# Patient Record
Sex: Male | Born: 1955 | Race: White | Hispanic: No | Marital: Married | Smoking: Former smoker
Health system: Southern US, Community
[De-identification: ages and names within clinical notes are randomized; demographics above are authoritative.]

## PROBLEM LIST (undated history)

## (undated) DIAGNOSIS — H269 Unspecified cataract: Secondary | ICD-10-CM

## (undated) DIAGNOSIS — K219 Gastro-esophageal reflux disease without esophagitis: Secondary | ICD-10-CM

## (undated) DIAGNOSIS — R7303 Prediabetes: Secondary | ICD-10-CM

## (undated) DIAGNOSIS — I10 Essential (primary) hypertension: Secondary | ICD-10-CM

## (undated) DIAGNOSIS — E785 Hyperlipidemia, unspecified: Secondary | ICD-10-CM

## (undated) DIAGNOSIS — Z86010 Personal history of colonic polyps: Secondary | ICD-10-CM

## (undated) HISTORY — PX: COLONOSCOPY: SHX174

## (undated) HISTORY — PX: POLYPECTOMY: SHX149

## (undated) HISTORY — DX: Prediabetes: R73.03

## (undated) HISTORY — DX: Hyperlipidemia, unspecified: E78.5

## (undated) HISTORY — DX: Unspecified cataract: H26.9

## (undated) HISTORY — DX: Personal history of colonic polyps: Z86.010

## (undated) HISTORY — DX: Essential (primary) hypertension: I10

## (undated) HISTORY — DX: Gastro-esophageal reflux disease without esophagitis: K21.9

## (undated) HISTORY — PX: EXTERNAL EAR SURGERY: SHX627

## (undated) HISTORY — PX: WRIST SURGERY: SHX841

## (undated) HISTORY — PX: OTHER SURGICAL HISTORY: SHX169

---

## 2001-09-21 ENCOUNTER — Encounter: Payer: Self-pay | Admitting: Emergency Medicine

## 2001-09-21 ENCOUNTER — Observation Stay (HOSPITAL_COMMUNITY): Admission: EM | Admit: 2001-09-21 | Discharge: 2001-09-22 | Payer: Self-pay | Admitting: Emergency Medicine

## 2006-10-26 ENCOUNTER — Ambulatory Visit: Payer: Self-pay | Admitting: Internal Medicine

## 2006-11-05 ENCOUNTER — Encounter: Payer: Self-pay | Admitting: Internal Medicine

## 2006-11-05 ENCOUNTER — Ambulatory Visit: Payer: Self-pay | Admitting: Internal Medicine

## 2006-11-05 DIAGNOSIS — Z860100 Personal history of colon polyps, unspecified: Secondary | ICD-10-CM

## 2006-11-05 HISTORY — DX: Personal history of colon polyps, unspecified: Z86.0100

## 2006-11-05 HISTORY — DX: Personal history of colonic polyps: Z86.010

## 2009-07-22 ENCOUNTER — Emergency Department (HOSPITAL_COMMUNITY): Admission: EM | Admit: 2009-07-22 | Discharge: 2009-07-23 | Payer: Self-pay | Admitting: Emergency Medicine

## 2010-06-14 NOTE — H&P (Signed)
NAME:  Samuel Macias, Samuel Macias NO.:  [id number]   MEDICAL RECORD NO.:  [id number]                   PATIENT TYPE:  INP   LOCATION:  4727                                 FACILITY:  [facility]   PHYSICIAN:  Rollene Rotunda, M.D. [facility]            DATE OF BIRTH:  11/22/1955   DATE OF ADMISSION:  09/21/2001  DATE OF DISCHARGE:  09/22/2001                                HISTORY & PHYSICAL   PRIMARY CARE PHYSICIAN:  [facility].   CARDIOLOGIST:  None.   REASON FOR ADMISSION:  Evaluate patient with chest pain.   HISTORY OF PRESENT ILLNESS:  The patient is a pleasant 55 year old gentleman  with no prior cardiac history.  He reports a history of feeling flushed this  a.m.  He was somewhat diaphoretic on his forehead and his neck.  He felt  slightly nauseated and said he was on the verge of being dizzy.  He was  very slightly short of breath.  His ears and head started to tingling and  then his hands.  All of this started approximately 9:15.  The symptoms  continued unabated until 10:30 when he called EMS.  At this point he was  feeling quite anxious.  He was seen by EMS and his blood pressure was  190/100.  He was treated with sublingual nitroglycerin and aspirin and he  said that slowly his symptoms resolved.  He had never had symptoms like this  before.  He has occasional fleeting sharp chest pains and had one at 6:30  this morning but did not have chest pressure or discomfort during this.  He  did not describe neck discomfort or arm pressure.  He did not vomit.   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  With this he denies  any chest discomfort, neck discomfort, arm discomfort, excessive shortness  of breath or diaphoresis.  He is currently symptom free.   PAST MEDICAL HISTORY:  Borderline hypertension, mild hyperlipidemia x 1-1/2  years, cervical disc disease.   PAST SURGICAL HISTORY:  Left wrist tendon repair.   ALLERGIES:  None.   MEDICATIONS:  Lipitor 10 mg q.d., multivitamin.   SOCIAL HISTORY:  The patient smoked briefly when he was a teenager.  He does  chew tobacco.  He drinks one case of beer per week.  He is married and works  for [employer].   FAMILY HISTORY:  Noncontributory for early coronary artery disease.   REVIEW OF SYSTEMS:  As stated in the HPI, otherwise negative for all  systems.   PHYSICAL EXAMINATION:  GENERAL:  The patient is in no distress.  VITAL SIGNS:  Temperature 100.3, heart rate 70s and regular, blood pressure  148/91.  HEENT:  Eyelids unremarkable.  Pupils equal, round and reactive to light.  Fundi not visualized.  Oral mucosa unremarkable.  NECK:  No jugular venous distention.  Carotid upstroke brisk and symmetric.  No bruits.  No thyromegaly.  LYMPHATICS:  No cervical, axillary or inguinal adenopathy.  LUNGS:  Clear to auscultation bilaterally.  BACK:  No costovertebral angle tenderness.  CHEST:  Unremarkable.  HEART:  PMI not displaced or sustained.  S1 and S2 within normal limits.  No  S3, no S4.  No murmurs.  ABDOMEN:  Flat, positive bowel sounds.  Normal in frequency and pitch.  No  bruits, rebound, guarding or mass.  No hepatosplenomegaly.  SKIN:  No rash, no nodules.  EXTREMITIES:  2+ pulses throughout.  No edema.  NEUROLOGIC:  Oriented to person, place and time.  Cranial nerves II-XII  grossly intact.  Motor grossly intact.   LABORATORY DATA:  EKG:  Sinus rhythm, rate 70, axis within normal limits,  intervals within normal limits, no acute ST-T wave changes.  Chest x-ray:  Mild left lower lobe lingular atelectasis.  There is an unclear metallic,  small density in the anterior chest.  WBC 5.2, hemoglobin 13.6.  Sodium 140,  potassium 3.8, BUN 20, creatinine 1.1, CK 148.   ASSESSMENT AND PLAN:  1. Chest discomfort.  The patient's chest discomfort and other symptoms are     atypical.  He has minimal cardiovascular risk factors.  His physical     examination is unrevealing for  vascular disease.  His EKG is within     normal limits.  At this point given the risk factors and the resolution     of the symptoms with nitroglycerin I do think it is prudent to keep him     in the hospital for observation and rule out myocardial infarction.     Provided his enzymes are negative, he will be sent for a stress perfusion     study at the office tomorrow.  Further evaluation will be based on these     results.  The risks and benefits have been described to the patient and     he agrees to proceed.  2. Hypertension.  We will observe this overnight.  I suspect he will need     antihypertensive therapy, perhaps a diuretic.  3. Hyperlipidemia.  We will check a fasting lipid profile.  4. Tobacco.  We will educate him.                                               Rollene Rotunda, M.D. [facility]    JH/MEDQ  D:  09/21/2001  T:  09/22/2001  Job:  [id number]

## 2010-06-14 NOTE — Discharge Summary (Signed)
   NAME:  Samuel Macias, Samuel Macias NO.:  [id number]   MEDICAL RECORD NO.:  [id number]                   PATIENT TYPE:  INP   LOCATION:  4727                                 FACILITY:  [facility]   PHYSICIAN:  Rollene Rotunda, M.D. [facility]            DATE OF BIRTH:  1955/06/22   DATE OF ADMISSION:  09/21/2001  DATE OF DISCHARGE:  09/22/2001                           DISCHARGE SUMMARY - REFERRING   DISCHARGE DIAGNOSES:  1. Chest pain.  2. Hyperlipidemia, treated.  3. Hypertension.   HISTORY OF PRESENT ILLNESS:  The patient is a 55 year old male patient who  presented to [facility] with a two month history of fleeting, sharp  mid-sternal chest pain.  He denied any chest discomfort with exertion or  strenuous activity.   PAST MEDICAL HISTORY:  Significant for hyperlipidemia, treated.  He is a  smoker.  He has a history of borderline hypertension and is not on any  medication for this with the exception of his diet control.   HOSPITAL COURSE:  The patient ruled out for myocardial infarction during his  hospital stay.  His EKG revealed normal sinus rhythm with a rate of 70 with  normal axis and no acute ST-T wave changes.  His EKG revealed stable  overnight and his blood pressure also remained stable (120/79).  He was  discharged to home after a brief, 24-hour, observation.  He is scheduled to  be seen in the office today, 09/22/2001, at 12:30 p.m. for a stress test.  He has received instructions not to eat prior to this.  He is to remain on  his same medications which include Lipitor and a multivitamin 1 p.o. q.d.  He was also given a new prescription for sublingual nitroglycerin p.r.n.  chest pain.  He is to return to work on 09/28/2001 and remain on a low fat  diet.  He has a followup appointment with Dr. Antoine Poche on 10/12/2001 at 11  a.m.  Dr. Antoine Poche will be in the office on this day and will need to be  alerted that this patient is in the office.   LABORATORY DATA:  Sodium 140, potassium 3.8, BUN 20, creatinine 1.1.  Cardiac isoenzymes are negative.  CBC reveals a hemoglobin of 13.6,  hematocrit 39.8, platelets 230, white count 5.2.  TSH was normal.     Guy Franco, P.A. [facility]                      Rollene Rotunda, M.D. [facility]    LB/MEDQ  D:  09/22/2001  T:  09/24/2001  Job:  [id number]   cc:   [facility]

## 2011-03-01 ENCOUNTER — Encounter: Payer: Self-pay | Admitting: Family Medicine

## 2011-03-01 ENCOUNTER — Ambulatory Visit (INDEPENDENT_AMBULATORY_CARE_PROVIDER_SITE_OTHER): Payer: BC Managed Care – PPO | Admitting: Family Medicine

## 2011-03-01 VITALS — BP 124/80 | HR 57 | Temp 97.9°F | Resp 16 | Ht 65.0 in | Wt 156.0 lb

## 2011-03-01 DIAGNOSIS — I10 Essential (primary) hypertension: Secondary | ICD-10-CM | POA: Insufficient documentation

## 2011-03-01 DIAGNOSIS — E785 Hyperlipidemia, unspecified: Secondary | ICD-10-CM | POA: Insufficient documentation

## 2011-03-01 LAB — COMPREHENSIVE METABOLIC PANEL
ALT: 22 U/L (ref 0–53)
AST: 25 U/L (ref 0–37)
Albumin: 4.9 g/dL (ref 3.5–5.2)
Alkaline Phosphatase: 32 U/L — ABNORMAL LOW (ref 39–117)
BUN: 14 mg/dL (ref 6–23)
CO2: 29 mEq/L (ref 19–32)
Calcium: 9.8 mg/dL (ref 8.4–10.5)
Chloride: 103 mEq/L (ref 96–112)
Creat: 0.91 mg/dL (ref 0.50–1.35)
Glucose, Bld: 107 mg/dL — ABNORMAL HIGH (ref 70–99)
Potassium: 4.9 mEq/L (ref 3.5–5.3)
Sodium: 141 mEq/L (ref 135–145)
Total Bilirubin: 0.5 mg/dL (ref 0.3–1.2)
Total Protein: 7.2 g/dL (ref 6.0–8.3)

## 2011-03-01 LAB — LIPID PANEL
Cholesterol: 191 mg/dL (ref 0–200)
HDL: 83 mg/dL (ref >39)
LDL Cholesterol: 96 mg/dL (ref 0–99)
Total CHOL/HDL Ratio: 2.3 Ratio
Triglycerides: 58 mg/dL (ref <150)
VLDL: 12 mg/dL (ref 0–40)

## 2011-03-01 MED ORDER — METOPROLOL SUCCINATE ER 25 MG PO TB24: 25.0000 mg | ORAL_TABLET | Freq: Every day | ORAL | Status: DC

## 2011-03-01 MED ORDER — ROSUVASTATIN CALCIUM 20 MG PO TABS: 20.0000 mg | ORAL_TABLET | Freq: Every day | ORAL | Status: DC

## 2011-03-01 MED ORDER — LISINOPRIL 10 MG PO TABS: 10.0000 mg | ORAL_TABLET | Freq: Every day | ORAL | Status: DC

## 2011-03-01 NOTE — Patient Instructions (Signed)
Hypertriglyceridemia  Diet for High blood levels of Triglycerides Most fats in food are triglycerides. Triglycerides in your blood are stored as fat in your body. High levels of triglycerides in your blood may put you at a greater risk for heart disease and stroke.  Normal triglyceride levels are less than 150 mg/dL. Borderline high levels are 150-199 mg/dl. High levels are 200 - 499 mg/dL, and very high triglyceride levels are greater than 500 mg/dL. The decision to treat high triglycerides is generally based on the level. For people with borderline or high triglyceride levels, treatment includes weight loss and exercise. Drugs are recommended for people with very high triglyceride levels. Many people who need treatment for high triglyceride levels have metabolic syndrome. This syndrome is a collection of disorders that often include: insulin resistance, high blood pressure, blood clotting problems, high cholesterol and triglycerides. TESTING PROCEDURE FOR TRIGLYCERIDES  You should not eat 4 hours before getting your triglycerides measured. The normal range of triglycerides is between 10 and 250 milligrams per deciliter (mg/dl). Some people may have extreme levels (1000 or above), but your triglyceride level may be too high if it is above 150 mg/dl, depending on what other risk factors you have for heart disease.   People with high blood triglycerides may also have high blood cholesterol levels. If you have high blood cholesterol as well as high blood triglycerides, your risk for heart disease is probably greater than if you only had high triglycerides. High blood cholesterol is one of the main risk factors for heart disease.  CHANGING YOUR DIET  Your weight can affect your blood triglyceride level. If you are more than 20% above your ideal body weight, you may be able to lower your blood triglycerides by losing weight. Eating less and exercising regularly is the best way to combat this. Fat provides  more calories than any other food. The best way to lose weight is to eat less fat. Only 30% of your total calories should come from fat. Less than 7% of your diet should come from saturated fat. A diet low in fat and saturated fat is the same as a diet to decrease blood cholesterol. By eating a diet lower in fat, you may lose weight, lower your blood cholesterol, and lower your blood triglyceride level.  Eating a diet low in fat, especially saturated fat, may also help you lower your blood triglyceride level. Ask your dietitian to help you figure how much fat you can eat based on the number of calories your caregiver has prescribed for you.  Exercise, in addition to helping with weight loss may also help lower triglyceride levels.   Alcohol can increase blood triglycerides. You may need to stop drinking alcoholic beverages.   Too much carbohydrate in your diet may also increase your blood triglycerides. Some complex carbohydrates are necessary in your diet. These may include bread, rice, potatoes, other starchy vegetables and cereals.   Reduce "simple" carbohydrates. These may include pure sugars, candy, honey, and jelly without losing other nutrients. If you have the kind of high blood triglycerides that is affected by the amount of carbohydrates in your diet, you will need to eat less sugar and less high-sugar foods. Your caregiver can help you with this.   Adding 2-4 grams of fish oil (EPA+ DHA) may also help lower triglycerides. Speak with your caregiver before adding any supplements to your regimen.  Following the Diet  Maintain your ideal weight. Your caregivers can help you with a diet. Generally,   eating less food and getting more exercise will help you lose weight. Joining a weight control group may also help. Ask your caregivers for a good weight control group in your area.  Eat low-fat foods instead of high-fat foods. This can help you lose weight too.  These foods are lower in fat. Eat MORE  of these:   Dried beans, peas, and lentils.   Egg whites.   Low-fat cottage cheese.   Fish.   Lean cuts of meat, such as round, sirloin, rump, and flank (cut extra fat off meat you fix).   Whole grain breads, cereals and pasta.   Skim and nonfat dry milk.   Low-fat yogurt.   Poultry without the skin.   Cheese made with skim or part-skim milk, such as mozzarella, parmesan, farmers', ricotta, or pot cheese.  These are higher fat foods. Eat LESS of these:   Whole milk and foods made from whole milk, such as American, blue, cheddar, monterey jack, and swiss cheese   High-fat meats, such as luncheon meats, sausages, knockwurst, bratwurst, hot dogs, ribs, corned beef, ground pork, and regular ground beef.   Fried foods.  Limit saturated fats in your diet. Substituting unsaturated fat for saturated fat may decrease your blood triglyceride level. You will need to read package labels to know which products contain saturated fats.  These foods are high in saturated fat. Eat LESS of these:   Fried pork skins.   Whole milk.   Skin and fat from poultry.   Palm oil.   Butter.   Shortening.   Cream cheese.   Bacon.   Margarines and baked goods made from listed oils.   Vegetable shortenings.   Chitterlings.   Fat from meats.   Coconut oil.   Palm kernel oil.   Lard.   Cream.   Sour cream.   Fatback.   Coffee whiteners and non-dairy creamers made with these oils.   Cheese made from whole milk.  Use unsaturated fats (both polyunsaturated and monounsaturated) moderately. Remember, even though unsaturated fats are better than saturated fats; you still want a diet low in total fat.  These foods are high in unsaturated fat:   Canola oil.   Sunflower oil.   Mayonnaise.   Almonds.   Peanuts.   Pine nuts.   Margarines made with these oils.   Safflower oil.   Olive oil.   Avocados.   Cashews.   Peanut butter.   Sunflower seeds.   Soybean oil.     Peanut oil.   Olives.   Pecans.   Walnuts.   Pumpkin seeds.  Avoid sugar and other high-sugar foods. This will decrease carbohydrates without decreasing other nutrients. Sugar in your food goes rapidly to your blood. When there is excess sugar in your blood, your liver may use it to make more triglycerides. Sugar also contains calories without other important nutrients.  Eat LESS of these:   Sugar, brown sugar, powdered sugar, jam, jelly, preserves, honey, syrup, molasses, pies, candy, cakes, cookies, frosting, pastries, colas, soft drinks, punches, fruit drinks, and regular gelatin.   Avoid alcohol. Alcohol, even more than sugar, may increase blood triglycerides. In addition, alcohol is high in calories and low in nutrients. Ask for sparkling water, or a diet soft drink instead of an alcoholic beverage.  Suggestions for planning and preparing meals   Bake, broil, grill or roast meats instead of frying.   Remove fat from meats and skin from poultry before cooking.   Add spices,   herbs, lemon juice or vinegar to vegetables instead of salt, rich sauces or gravies.   Use a non-stick skillet without fat or use no-stick sprays.   Cool and refrigerate stews and broth. Then remove the hardened fat floating on the surface before serving.   Refrigerate meat drippings and skim off fat to make low-fat gravies.   Serve more fish.   Use less butter, margarine and other high-fat spreads on bread or vegetables.   Use skim or reconstituted non-fat dry milk for cooking.   Cook with low-fat cheeses.   Substitute low-fat yogurt or cottage cheese for all or part of the sour cream in recipes for sauces, dips or congealed salads.   Use half yogurt/half mayonnaise in salad recipes.   Substitute evaporated skim milk for cream. Evaporated skim milk or reconstituted non-fat dry milk can be whipped and substituted for whipped cream in certain recipes.   Choose fresh fruits for dessert instead of  high-fat foods such as pies or cakes. Fruits are naturally low in fat.  When Dining Out   Order low-fat appetizers such as fruit or vegetable juice, pasta with vegetables or tomato sauce.   Select clear, rather than cream soups.   Ask that dressings and gravies be served on the side. Then use less of them.   Order foods that are baked, broiled, poached, steamed, stir-fried, or roasted.   Ask for margarine instead of butter, and use only a small amount.   Drink sparkling water, unsweetened tea or coffee, or diet soft drinks instead of alcohol or other sweet beverages.  QUESTIONS AND ANSWERS ABOUT OTHER FATS IN THE BLOOD: SATURATED FAT, TRANS FAT, AND CHOLESTEROL What is trans fat? Trans fat is a type of fat that is formed when vegetable oil is hardened through a process called hydrogenation. This process helps makes foods more solid, gives them shape, and prolongs their shelf life. Trans fats are also called hydrogenated or partially hydrogenated oils.  What do saturated fat, trans fat, and cholesterol in foods have to do with heart disease? Saturated fat, trans fat, and cholesterol in the diet all raise the level of LDL "bad" cholesterol in the blood. The higher the LDL cholesterol, the greater the risk for coronary heart disease (CHD). Saturated fat and trans fat raise LDL similarly.  What foods contain saturated fat, trans fat, and cholesterol? High amounts of saturated fat are found in animal products, such as fatty cuts of meat, chicken skin, and full-fat dairy products like butter, whole milk, cream, and cheese, and in tropical vegetable oils such as palm, palm kernel, and coconut oil. Trans fat is found in some of the same foods as saturated fat, such as vegetable shortening, some margarines (especially hard or stick margarine), crackers, cookies, baked goods, fried foods, salad dressings, and other processed foods made with partially hydrogenated vegetable oils. Small amounts of trans fat  also occur naturally in some animal products, such as milk products, beef, and lamb. Foods high in cholesterol include liver, other organ meats, egg yolks, shrimp, and full-fat dairy products. How can I use the new food label to make heart-healthy food choices? Check the Nutrition Facts panel of the food label. Choose foods lower in saturated fat, trans fat, and cholesterol. For saturated fat and cholesterol, you can also use the Percent Daily Value (%DV): 5% DV or less is low, and 20% DV or more is high. (There is no %DV for trans fat.) Use the Nutrition Facts panel to choose foods low in   saturated fat and cholesterol, and if the trans fat is not listed, read the ingredients and limit products that list shortening or hydrogenated or partially hydrogenated vegetable oil, which tend to be high in trans fat. POINTS TO REMEMBER: YOU NEED A LITTLE TLC (THERAPEUTIC LIFESTYLE CHANGES)  Discuss your risk for heart disease with your caregivers, and take steps to reduce risk factors.   Change your diet. Choose foods that are low in saturated fat, trans fat, and cholesterol.   Add exercise to your daily routine if it is not already being done. Participate in physical activity of moderate intensity, like brisk walking, for at least 30 minutes on most, and preferably all days of the week. No time? Break the 30 minutes into three, 10-minute segments during the day.   Stop smoking. If you do smoke, contact your caregiver to discuss ways in which they can help you quit.   Do not use street drugs.   Maintain a normal weight.   Maintain a healthy blood pressure.   Keep up with your blood work for checking the fats in your blood as directed by your caregiver.  Document Released: 11/01/2003 Document Revised: 09/25/2010 Document Reviewed: 05/29/2008 [employer] Patient Information 2012 [employer]. 

## 2011-03-01 NOTE — Progress Notes (Signed)
  Subjective:    Patient ID: Samuel Macias, male    DOB: 03-28-1955, 56 y.o.   MRN: [id number]  Hyperlipidemia This is a chronic problem. The current episode started more than 1 year ago. The problem is controlled. Recent lipid tests were reviewed and are normal. He has no history of chronic renal disease, diabetes, hypothyroidism, liver disease, obesity or nephrotic syndrome. Factors aggravating his hyperlipidemia include no known factors. Pertinent negatives include no chest pain, focal sensory loss or myalgias. Current antihyperlipidemic treatment includes statins. The current treatment provides significant improvement of lipids. There are no compliance problems.  Risk factors for coronary artery disease include hypertension and male sex.      Review of Systems  Cardiovascular: Negative for chest pain.  Musculoskeletal: Negative for myalgias.  All other systems reviewed and are negative.  absent right ear canal     Objective:   Physical Exam  Constitutional: He is oriented to person, place, and time. He appears well-developed and well-nourished.  HENT:  Head: Normocephalic and atraumatic.  Left Ear: External ear normal.  Eyes: Conjunctivae are normal. Pupils are equal, round, and reactive to light.  Neck: Normal range of motion. Neck supple. No thyromegaly present.  Cardiovascular: Normal rate, regular rhythm and normal heart sounds.   Pulmonary/Chest: Breath sounds normal.  Abdominal: Soft. Bowel sounds are normal. He exhibits no mass. There is no tenderness.  Neurological: He is alert and oriented to person, place, and time.  Skin: Skin is warm and dry.          Assessment & Plan:  Normal exam.  Stable BP

## 2011-10-13 ENCOUNTER — Encounter: Payer: Self-pay | Admitting: Internal Medicine

## 2011-12-06 ENCOUNTER — Ambulatory Visit (INDEPENDENT_AMBULATORY_CARE_PROVIDER_SITE_OTHER): Payer: BC Managed Care – PPO | Admitting: Emergency Medicine

## 2011-12-06 VITALS — BP 138/82 | HR 66 | Temp 98.6°F | Resp 16 | Ht 65.5 in | Wt 155.0 lb

## 2011-12-06 DIAGNOSIS — E782 Mixed hyperlipidemia: Secondary | ICD-10-CM

## 2011-12-06 DIAGNOSIS — I10 Essential (primary) hypertension: Secondary | ICD-10-CM

## 2011-12-06 MED ORDER — METOPROLOL SUCCINATE ER 25 MG PO TB24: 25.0000 mg | ORAL_TABLET | Freq: Every day | ORAL | Status: DC

## 2011-12-06 NOTE — Progress Notes (Signed)
[facility] [address], [place] [address] [phone]  Date:  12/06/2011   Name:  Samuel Macias   DOB:  1955-11-19   MRN:  [id number]  PCP:  No primary provider on file.    Chief Complaint: Hyperlipidemia   History of Present Illness:  Samuel Macias is a 56 y.o. very pleasant male patient who presents with the following:  History of hypertension and hyperlipidemia.  Had several spell spells of light headedness that passed promptly and was concerned that his blood pressure may have been a little too high.  Never checked it around the times of the lightheaded feeling.  No visual change, syncope, rapid or irregular heartbeat or chest pain or shortness of breath, numbness or weakness.  No other neurological symptoms.  Taking medication as prescribed.  Has been watching his pressure at home and has been "fine" according to patient.  Patient Active Problem List  Diagnosis  . Hypertension  . Hyperlipidemia    Past Medical History  Diagnosis Date  . Hypertension   . Hyperlipidemia     Past Surgical History  Procedure Date  . Pain block     Herniated disc repair    History  Substance Use Topics  . Smoking status: Never Smoker   . Smokeless tobacco: Not on file  . Alcohol Use: Not on file    No family history on file.  No Known Allergies  Medication list has been reviewed and updated.  Current Outpatient Prescriptions on File Prior to Visit  Medication Sig Dispense Refill  . lisinopril (PRINIVIL,ZESTRIL) 10 MG tablet Take 1 tablet (10 mg total) by mouth daily.  90 tablet  3  . metoprolol succinate (TOPROL-XL) 25 MG 24 hr tablet Take 1 tablet (25 mg total) by mouth daily.  90 tablet  3  . rosuvastatin (CRESTOR) 20 MG tablet Take 1 tablet (20 mg total) by mouth daily.  90 tablet  3    Review of Systems:  As per HPI, otherwise negative.    Physical Examination: Filed Vitals:   12/06/11 0948  BP: 138/82  Pulse: 66  Temp: 98.6 F (37  C)  Resp: 16   Filed Vitals:   12/06/11 0948  Height: 5' 5.5" (1.664 m)  Weight: 155 lb (70.308 kg)   Body mass index is 25.40 kg/(m^2). Ideal Body Weight: Weight in (lb) to have BMI = 25: 152.2   GEN: WDWN, NAD, Non-toxic, A & O x 3  No sepsis or shortness of breath. HEENT: Atraumatic, Normocephalic. Neck supple. No masses, No LAD.  Oropharynx negative.  PRRERLA EOMI.  Fundi benign Ears and Nose: No external deformity.  Left TM normal NECK no carotid bruit CV: RRR, No M/G/R. No JVD. No thrill. No extra heart sounds. PULM: CTA B, no wheezes, crackles, rhonchi. No retractions. No resp. distress. No accessory muscle use. ABD: S, NT, ND, +BS. No rebound. No HSM. EXTR: No c/c/e NEURO Normal gait.  PSYCH: Normally interactive. Conversant. Not depressed or anxious appearing.  Calm demeanor.    Assessment and Plan: Hypertension  Will record twice daily BP and bring it by after a month and will review. Continue medication  Carmelina Dane, MD

## 2011-12-14 NOTE — Progress Notes (Signed)
Reviewed and agree.

## 2012-01-28 ENCOUNTER — Ambulatory Visit (INDEPENDENT_AMBULATORY_CARE_PROVIDER_SITE_OTHER): Payer: BC Managed Care – PPO | Admitting: Emergency Medicine

## 2012-01-28 VITALS — BP 120/82 | HR 77 | Temp 98.2°F | Resp 18 | Wt 153.0 lb

## 2012-01-28 DIAGNOSIS — S335XXA Sprain of ligaments of lumbar spine, initial encounter: Secondary | ICD-10-CM

## 2012-01-28 MED ORDER — HYDROCODONE-ACETAMINOPHEN 5-325 MG PO TABS: 1.0000 | ORAL_TABLET | ORAL | Status: DC | PRN

## 2012-01-28 MED ORDER — CYCLOBENZAPRINE HCL 10 MG PO TABS: 10.0000 mg | ORAL_TABLET | Freq: Three times a day (TID) | ORAL | Status: DC | PRN

## 2012-01-28 MED ORDER — NAPROXEN SODIUM 550 MG PO TABS: 550.0000 mg | ORAL_TABLET | Freq: Two times a day (BID) | ORAL | Status: DC

## 2012-01-28 NOTE — Patient Instructions (Addendum)
Back Pain, Adult Low back pain is very common. About 1 in 5 people have back pain.The cause of low back pain is rarely dangerous. The pain often gets better over time.About half of people with a sudden onset of back pain feel better in just 2 weeks. About 8 in 10 people feel better by 6 weeks.  CAUSES Some common causes of back pain include:  Strain of the muscles or ligaments supporting the spine.  Wear and tear (degeneration) of the spinal discs.  Arthritis.  Direct injury to the back. DIAGNOSIS Most of the time, the direct cause of low back pain is not known.However, back pain can be treated effectively even when the exact cause of the pain is unknown.Answering your caregiver's questions about your overall health and symptoms is one of the most accurate ways to make sure the cause of your pain is not dangerous. If your caregiver needs more information, he or she may order lab work or imaging tests (X-rays or MRIs).However, even if imaging tests show changes in your back, this usually does not require surgery. HOME CARE INSTRUCTIONS For many people, back pain returns.Since low back pain is rarely dangerous, it is often a condition that people can learn to manageon their own.   Remain active. It is stressful on the back to sit or stand in one place. Do not sit, drive, or stand in one place for more than 30 minutes at a time. Take short walks on level surfaces as soon as pain allows.Try to increase the length of time you walk each day.  Do not stay in bed.Resting more than 1 or 2 days can delay your recovery.  Do not avoid exercise or work.Your body is made to move.It is not dangerous to be active, even though your back may hurt.Your back will likely heal faster if you return to being active before your pain is gone.  Pay attention to your body when you bend and lift. Many people have less discomfortwhen lifting if they bend their knees, keep the load close to their bodies,and  avoid twisting. Often, the most comfortable positions are those that put less stress on your recovering back.  Find a comfortable position to sleep. Use a firm mattress and lie on your side with your knees slightly bent. If you lie on your back, put a pillow under your knees.  Only take over-the-counter or prescription medicines as directed by your caregiver. Over-the-counter medicines to reduce pain and inflammation are often the most helpful.Your caregiver may prescribe muscle relaxant drugs.These medicines help dull your pain so you can more quickly return to your normal activities and healthy exercise.  Put ice on the injured area.  Put ice in a plastic bag.  Place a towel between your skin and the bag.  Leave the ice on for 15 to 20 minutes, 3 to 4 times a day for the first 2 to 3 days. After that, ice and heat may be alternated to reduce pain and spasms.  Ask your caregiver about trying back exercises and gentle massage. This may be of some benefit.  Avoid feeling anxious or stressed.Stress increases muscle tension and can worsen back pain.It is important to recognize when you are anxious or stressed and learn ways to manage it.Exercise is a great option. SEEK MEDICAL CARE IF:  You have pain that is not relieved with rest or medicine.  You have pain that does not improve in 1 week.  You have new symptoms.  You are generally   not feeling well. SEEK IMMEDIATE MEDICAL CARE IF:   You have pain that radiates from your back into your legs.  You develop new bowel or bladder control problems.  You have unusual weakness or numbness in your arms or legs.  You develop nausea or vomiting.  You develop abdominal pain.  You feel faint. Document Released: 01/13/2005 Document Revised: 07/15/2011 Document Reviewed: 06/03/2010 [employer] Patient Information 2013 [employer].  

## 2012-01-28 NOTE — Progress Notes (Signed)
[facility] [address] [place] [address] [phone]  Date:  01/28/2012   Name:  Samuel Macias   DOB:  1956/01/09   MRN:  [id number]  PCP:  No primary provider on file.    Chief Complaint: Back Pain   History of Present Illness:  Samuel Macias is a 57 y.o. very pleasant male patient who presents with the following:  1 week duration pain in left lumbar region.  No radiation or neurological symptoms.  No history of injury.  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  No history of prior back injury.  Patient Active Problem List  Diagnosis  . Hypertension  . Hyperlipidemia    Past Medical History  Diagnosis Date  . Hypertension   . Hyperlipidemia     Past Surgical History  Procedure Date  . Pain block     Herniated disc repair    History  Substance Use Topics  . Smoking status: Never Smoker   . Smokeless tobacco: Not on file  . Alcohol Use: Not on file    No family history on file.  No Known Allergies  Medication list has been reviewed and updated.  Current Outpatient Prescriptions on File Prior to Visit  Medication Sig Dispense Refill  . lisinopril (PRINIVIL,ZESTRIL) 10 MG tablet Take 1 tablet (10 mg total) by mouth daily.  90 tablet  3  . metoprolol succinate (TOPROL-XL) 25 MG 24 hr tablet Take 1 tablet (25 mg total) by mouth daily.  30 tablet  0  . rosuvastatin (CRESTOR) 20 MG tablet Take 1 tablet (20 mg total) by mouth daily.  90 tablet  3    Review of Systems:  As per HPI, otherwise negative.    Physical Examination: Filed Vitals:   01/28/12 1305  BP: 120/82  Pulse: 77  Temp: 98.2 F (36.8 C)  Resp: 18   Filed Vitals:   01/28/12 1305  Weight: 153 lb (69.4 kg)   There is no height on file to calculate BMI. Ideal Body Weight:     GEN: WDWN, NAD, Non-toxic, Alert & Oriented x 3 HEENT: Atraumatic, Normocephalic.  Ears and Nose: No external deformity. EXTR: No  clubbing/cyanosis/edema NEURO: Normal gait.  PSYCH: Normally interactive. Conversant. Not depressed or anxious appearing.  Calm demeanor.  BACK:  Tender lumbar left paraspinous muscle.  Motor and sensory intact  Assessment and Plan: Lumbar strain Anaprox Flexeril vicodin Local heat  Carmelina Dane, MD

## 2012-02-01 ENCOUNTER — Other Ambulatory Visit: Payer: Self-pay | Admitting: Emergency Medicine

## 2012-02-13 ENCOUNTER — Telehealth: Payer: Self-pay

## 2012-02-13 DIAGNOSIS — E785 Hyperlipidemia, unspecified: Secondary | ICD-10-CM

## 2012-02-13 MED ORDER — ROSUVASTATIN CALCIUM 20 MG PO TABS: 20.0000 mg | ORAL_TABLET | Freq: Every day | ORAL | Status: DC

## 2012-02-13 NOTE — Telephone Encounter (Signed)
Pt needs [place] to fax his request for crestor to [facility] for 90 day supply please call patient with questions at [phone]

## 2012-02-13 NOTE — Telephone Encounter (Signed)
Should he come in for lipid panel first? Last labs on 02/2011.

## 2012-02-13 NOTE — Telephone Encounter (Signed)
We certainly can refill his medication for him. But what is his follow up plan with [place] regarding his lipids? I will go ahead and send this in.

## 2012-02-13 NOTE — Telephone Encounter (Signed)
Called patient to advise, he plans to come in Feb.

## 2012-03-20 ENCOUNTER — Ambulatory Visit (INDEPENDENT_AMBULATORY_CARE_PROVIDER_SITE_OTHER): Payer: BC Managed Care – PPO | Admitting: Internal Medicine

## 2012-03-20 VITALS — BP 131/78 | HR 61 | Temp 97.3°F | Resp 17 | Ht 66.0 in | Wt 150.0 lb

## 2012-03-20 DIAGNOSIS — I10 Essential (primary) hypertension: Secondary | ICD-10-CM

## 2012-03-20 DIAGNOSIS — Z125 Encounter for screening for malignant neoplasm of prostate: Secondary | ICD-10-CM

## 2012-03-20 DIAGNOSIS — E785 Hyperlipidemia, unspecified: Secondary | ICD-10-CM

## 2012-03-20 LAB — COMPREHENSIVE METABOLIC PANEL
ALT: 21 U/L (ref 0–53)
AST: 22 U/L (ref 0–37)
Albumin: 4.5 g/dL (ref 3.5–5.2)
Alkaline Phosphatase: 36 U/L — ABNORMAL LOW (ref 39–117)
Glucose, Bld: 122 mg/dL — ABNORMAL HIGH (ref 70–99)
Potassium: 4.5 mEq/L (ref 3.5–5.3)
Sodium: 135 mEq/L (ref 135–145)
Total Bilirubin: 0.4 mg/dL (ref 0.3–1.2)
Total Protein: 7 g/dL (ref 6.0–8.3)

## 2012-03-20 LAB — LIPID PANEL
Cholesterol: 189 mg/dL (ref 0–200)
HDL: 77 mg/dL
LDL Cholesterol: 104 mg/dL — ABNORMAL HIGH (ref 0–99)
Total CHOL/HDL Ratio: 2.5 ratio
Triglycerides: 41 mg/dL
VLDL: 8 mg/dL (ref 0–40)

## 2012-03-20 LAB — POCT CBC
Granulocyte percent: 51.3 %G (ref 37–80)
Lymph, poc: 1.9 (ref 0.6–3.4)
MCHC: 32.4 g/dL (ref 31.8–35.4)
MID (cbc): 0.9 (ref 0–0.9)
MPV: 8.8 fL (ref 0–99.8)
POC Granulocyte: 3 (ref 2–6.9)
POC LYMPH PERCENT: 33.5 %L (ref 10–50)
POC MID %: 15.2 %M — AB (ref 0–12)
Platelet Count, POC: 270 10*3/uL (ref 142–424)
RDW, POC: 14.3 %

## 2012-03-20 MED ORDER — METOPROLOL SUCCINATE ER 25 MG PO TB24: ORAL_TABLET | ORAL | Status: DC

## 2012-03-20 MED ORDER — LISINOPRIL 10 MG PO TABS: 10.0000 mg | ORAL_TABLET | Freq: Every day | ORAL | Status: DC

## 2012-03-20 NOTE — Progress Notes (Addendum)
  Subjective:    Patient ID: Samuel Macias, male    DOB: [birth year], 57 y.o.   MRN: [id number]  HPI 57 year old male here for refills of medication for hyperlipidemia and hypertension Reports no problems this year Energy level good Sleep good No side effects of medication    Review of Systems No abnormal weight changes No fatigue No chest pain or palpitations No dyspnea on exertion No edema No visual changes or headaches    Objective:   Physical Exam BP 131/78  Pulse 61  Temp(Src) 97.3 F (36.3 C) (Oral)  Resp 17  Ht 5\' 6"  (1.676 m)  Wt 150 lb (68.04 kg)  BMI 24.22 kg/m2  SpO2 99% Pupils equal round reactive to light and accommodation/EOMs conjugate No thyromegaly Heart regular without murmur Peripheral pulses good/no edema Cranial nerves 2 through 12 intact   Results for orders placed in visit on 03/20/12  POCT CBC      Result Value Range   WBC 5.8  4.6 - 10.2 K/uL   Lymph, poc 1.9  0.6 - 3.4   POC LYMPH PERCENT 33.5  10 - 50 %L   MID (cbc) 0.9  0 - 0.9   POC MID % 15.2 (*) 0 - 12 %M   POC Granulocyte 3.0  2 - 6.9   Granulocyte percent 51.3  37 - 80 %G   RBC 4.66 (*) 4.69 - 6.13 M/uL   Hemoglobin 14.4  14.1 - 18.1 g/dL   HCT, POC 11.9  14.7 - 53.7 %   MCV 95.4  80 - 97 fL   MCH, POC 30.9  27 - 31.2 pg   MCHC 32.4  31.8 - 35.4 g/dL   RDW, POC 82.9     Platelet Count, POC 270  142 - 424 K/uL   MPV 8.8  0 - 99.8 fL       Assessment & Plan:  Problem #1 hypertension  Problem #2 hyperlipidemia   Refill Crestor after labs Meds ordered this encounter  Medications  . lisinopril (PRINIVIL,ZESTRIL) 10 MG tablet    Sig: Take 1 tablet (10 mg total) by mouth daily.    Dispense:  90 tablet    Refill:  3  . metoprolol succinate (TOPROL-XL) 25 MG 24 hr tablet    Sig: TAKE 1 TABLET BY MOUTH EVERY DAY    Dispense:  90 tablet    Refill:  3   needs review of immunizations Needs ?colonoscopy   Add2/23=crestor refilled

## 2012-03-21 ENCOUNTER — Encounter: Payer: Self-pay | Admitting: Internal Medicine

## 2012-03-21 LAB — PSA: PSA: 0.29 ng/mL (ref <=4.00)

## 2012-03-21 MED ORDER — ROSUVASTATIN CALCIUM 20 MG PO TABS: 20.0000 mg | ORAL_TABLET | Freq: Every day | ORAL | Status: DC

## 2012-03-21 NOTE — Addendum Note (Signed)
Addended by: Tonye Pearson on: 03/21/2012 10:39 AM   Modules accepted: Orders

## 2012-05-27 ENCOUNTER — Encounter: Payer: Self-pay | Admitting: Internal Medicine

## 2012-11-05 ENCOUNTER — Telehealth: Payer: Self-pay

## 2012-11-05 DIAGNOSIS — E785 Hyperlipidemia, unspecified: Secondary | ICD-10-CM

## 2012-11-05 MED ORDER — ROSUVASTATIN CALCIUM 20 MG PO TABS: 20.0000 mg | ORAL_TABLET | Freq: Every day | ORAL | Status: DC

## 2012-11-05 NOTE — Telephone Encounter (Signed)
Patient would like a refill on his crestor (3 month supply) it is mail order [phone]

## 2012-11-05 NOTE — Telephone Encounter (Signed)
Done

## 2013-02-12 ENCOUNTER — Ambulatory Visit (INDEPENDENT_AMBULATORY_CARE_PROVIDER_SITE_OTHER): Payer: BC Managed Care – PPO | Admitting: Emergency Medicine

## 2013-02-12 VITALS — BP 154/82 | HR 63 | Temp 98.4°F | Resp 16 | Ht 65.5 in | Wt 154.6 lb

## 2013-02-12 DIAGNOSIS — Z0000 Encounter for general adult medical examination without abnormal findings: Secondary | ICD-10-CM

## 2013-02-12 DIAGNOSIS — E785 Hyperlipidemia, unspecified: Secondary | ICD-10-CM

## 2013-02-12 DIAGNOSIS — I10 Essential (primary) hypertension: Secondary | ICD-10-CM

## 2013-02-12 DIAGNOSIS — E782 Mixed hyperlipidemia: Secondary | ICD-10-CM

## 2013-02-12 LAB — POCT URINALYSIS DIPSTICK
BILIRUBIN UA: NEGATIVE
Glucose, UA: NEGATIVE
Ketones, UA: NEGATIVE
Leukocytes, UA: NEGATIVE
NITRITE UA: NEGATIVE
PH UA: 6
PROTEIN UA: NEGATIVE
RBC UA: NEGATIVE
Spec Grav, UA: 1.015
Urobilinogen, UA: 0.2

## 2013-02-12 LAB — COMPREHENSIVE METABOLIC PANEL
ALBUMIN: 4.7 g/dL (ref 3.5–5.2)
ALT: 27 U/L (ref 0–53)
AST: 26 U/L (ref 0–37)
Alkaline Phosphatase: 39 U/L (ref 39–117)
BUN: 16 mg/dL (ref 6–23)
CALCIUM: 9.4 mg/dL (ref 8.4–10.5)
CHLORIDE: 99 meq/L (ref 96–112)
CO2: 28 meq/L (ref 19–32)
CREATININE: 0.8 mg/dL (ref 0.50–1.35)
Glucose, Bld: 108 mg/dL — ABNORMAL HIGH (ref 70–99)
POTASSIUM: 4.3 meq/L (ref 3.5–5.3)
Sodium: 135 mEq/L (ref 135–145)
Total Bilirubin: 0.7 mg/dL (ref 0.3–1.2)
Total Protein: 7.4 g/dL (ref 6.0–8.3)

## 2013-02-12 LAB — POCT GLYCOSYLATED HEMOGLOBIN (HGB A1C): HEMOGLOBIN A1C: 6

## 2013-02-12 LAB — POCT CBC
Granulocyte percent: 49.1 %G (ref 37–80)
HEMATOCRIT: 46.2 % (ref 43.5–53.7)
HEMOGLOBIN: 14.6 g/dL (ref 14.1–18.1)
LYMPH, POC: 2 (ref 0.6–3.4)
MCH: 30.9 pg (ref 27–31.2)
MCHC: 31.6 g/dL — AB (ref 31.8–35.4)
MCV: 97.8 fL — AB (ref 80–97)
MID (cbc): 0.7 (ref 0–0.9)
MPV: 9.2 fL (ref 0–99.8)
POC GRANULOCYTE: 2.6 (ref 2–6.9)
POC LYMPH PERCENT: 37.7 %L (ref 10–50)
POC MID %: 13.2 % — AB (ref 0–12)
Platelet Count, POC: 239 10*3/uL (ref 142–424)
RBC: 4.72 M/uL (ref 4.69–6.13)
RDW, POC: 13.3 %
WBC: 5.3 10*3/uL (ref 4.6–10.2)

## 2013-02-12 LAB — LIPID PANEL
CHOLESTEROL: 196 mg/dL (ref 0–200)
HDL: 91 mg/dL (ref >39)
LDL Cholesterol: 94 mg/dL (ref 0–99)
Total CHOL/HDL Ratio: 2.2 Ratio
Triglycerides: 55 mg/dL (ref <150)
VLDL: 11 mg/dL (ref 0–40)

## 2013-02-12 LAB — PSA: PSA: 0.31 ng/mL (ref <=4.00)

## 2013-02-12 LAB — TSH: TSH: 0.982 u[IU]/mL (ref 0.350–4.500)

## 2013-02-12 MED ORDER — LISINOPRIL 10 MG PO TABS: 10.0000 mg | ORAL_TABLET | Freq: Every day | ORAL | Status: DC

## 2013-02-12 MED ORDER — METOPROLOL SUCCINATE ER 50 MG PO TB24: ORAL_TABLET | ORAL | Status: DC

## 2013-02-12 NOTE — Patient Instructions (Signed)

## 2013-02-12 NOTE — Progress Notes (Signed)
[facility] [address], [place] [address] [phone]  Date:  02/12/2013   Name:  Samuel Macias   DOB:  18-Dec-1955   MRN:  [id number]  PCP:  No PCP Per Patient    Chief Complaint: Hyperlipidemia and Medication Refill   History of Present Illness:  Samuel Macias is a 58 y.o. very pleasant male patient who presents with the following:  History of hyperlipidemia and hypertension needs refills and labs.  Overdue for colonoscopy.  No current complaints.  Works as a Musician.  Denies other complaint or health concern today.   Patient Active Problem List   Diagnosis Date Noted  . Hypertension 03/01/2011  . Hyperlipidemia 03/01/2011    Past Medical History  Diagnosis Date  . Hypertension   . Hyperlipidemia     Past Surgical History  Procedure Laterality Date  . Pain block      Herniated disc repair    History  Substance Use Topics  . Smoking status: Never Smoker   . Smokeless tobacco: Not on file  . Alcohol Use: No    No family history on file.  No Known Allergies  Medication list has been reviewed and updated.  Current Outpatient Prescriptions on File Prior to Visit  Medication Sig Dispense Refill  . lisinopril (PRINIVIL,ZESTRIL) 10 MG tablet Take 1 tablet (10 mg total) by mouth daily.  90 tablet  3  . metoprolol succinate (TOPROL-XL) 25 MG 24 hr tablet TAKE 1 TABLET BY MOUTH EVERY DAY  90 tablet  3  . rosuvastatin (CRESTOR) 20 MG tablet Take 1 tablet (20 mg total) by mouth daily.  90 tablet  0   No current facility-administered medications on file prior to visit.    Review of Systems:  As per HPI, otherwise negative.    Physical Examination: Filed Vitals:   02/12/13 0908  BP: 154/82  Pulse: 63  Temp: 98.4 F (36.9 C)  Resp: 16   Filed Vitals:   02/12/13 0908  Height: 5' 5.5" (1.664 m)  Weight: 154 lb 9.6 oz (70.126 kg)   Body mass index is 25.33 kg/(m^2). Ideal Body Weight: Weight in (lb) to have BMI = 25:  152.2  GEN: WDWN, NAD, Non-toxic, A & O x 3 HEENT: Atraumatic, Normocephalic. Neck supple. No masses, No LAD. Ears and Nose: No external deformity. CV: RRR, No M/G/R. No JVD. No thrill. No extra heart sounds. PULM: CTA B, no wheezes, crackles, rhonchi. No retractions. No resp. distress. No accessory muscle use. ABD: S, NT, ND, +BS. No rebound. No HSM. EXTR: No c/c/e NEURO Normal gait.  PSYCH: Normally interactive. Conversant. Not depressed or anxious appearing.  Calm demeanor.    Assessment and Plan: Hypertension Hyperlipidemia Overdue colonoscopy Labs  Signed,  Ellison Carwin, MD

## 2013-02-13 MED ORDER — ROSUVASTATIN CALCIUM 20 MG PO TABS: 20.0000 mg | ORAL_TABLET | Freq: Every day | ORAL | Status: DC

## 2013-02-13 NOTE — Addendum Note (Signed)
Addended by: Roselee Culver on: 02/13/2013 09:09 AM   Modules accepted: Orders

## 2013-02-15 ENCOUNTER — Telehealth: Payer: Self-pay

## 2013-02-15 NOTE — Telephone Encounter (Signed)
Pt notified of lab results

## 2013-02-15 NOTE — Telephone Encounter (Signed)
Patient returned our call, please call again  [phone]

## 2013-06-21 ENCOUNTER — Ambulatory Visit (INDEPENDENT_AMBULATORY_CARE_PROVIDER_SITE_OTHER): Payer: BC Managed Care – PPO | Admitting: Family Medicine

## 2013-06-21 VITALS — BP 140/84 | HR 68 | Temp 97.7°F | Resp 16 | Ht 65.5 in | Wt 158.0 lb

## 2013-06-21 DIAGNOSIS — L02219 Cutaneous abscess of trunk, unspecified: Secondary | ICD-10-CM

## 2013-06-21 DIAGNOSIS — L03319 Cellulitis of trunk, unspecified: Secondary | ICD-10-CM

## 2013-06-21 DIAGNOSIS — S30861A Insect bite (nonvenomous) of abdominal wall, initial encounter: Secondary | ICD-10-CM

## 2013-06-21 DIAGNOSIS — W57XXXA Bitten or stung by nonvenomous insect and other nonvenomous arthropods, initial encounter: Principal | ICD-10-CM

## 2013-06-21 DIAGNOSIS — L03314 Cellulitis of groin: Secondary | ICD-10-CM

## 2013-06-21 DIAGNOSIS — S30860A Insect bite (nonvenomous) of lower back and pelvis, initial encounter: Secondary | ICD-10-CM

## 2013-06-21 MED ORDER — CEFTRIAXONE SODIUM 1 G IJ SOLR
1.0000 g | Freq: Once | INTRAMUSCULAR | Status: AC
  Administered 2013-06-21: 1 g via INTRAMUSCULAR

## 2013-06-21 MED ORDER — DOXYCYCLINE HYCLATE 100 MG PO TABS: 100.0000 mg | ORAL_TABLET | Freq: Two times a day (BID) | ORAL | Status: DC

## 2013-06-21 NOTE — Patient Instructions (Signed)
Start antibiotic after leaving office and 2nd dose tonight. If redness spreading more than an inch from line tomorrow - return for recheck, otherwise return in 48 hours for repeat eval of infection.  Return to the clinic or go to the nearest emergency room if any of your symptoms worsen or new symptoms occur.  Cellulitis Cellulitis is an infection of the skin and the tissue beneath it. The infected area is usually red and tender. Cellulitis occurs most often in the arms and lower legs.  CAUSES  Cellulitis is caused by bacteria that enter the skin through cracks or cuts in the skin. The most common types of bacteria that cause cellulitis are Staphylococcus and Streptococcus. SYMPTOMS   Redness and warmth.  Swelling.  Tenderness or pain.  Fever. DIAGNOSIS  Your caregiver can usually determine what is wrong based on a physical exam. Blood tests may also be done. TREATMENT  Treatment usually involves taking an antibiotic medicine. HOME CARE INSTRUCTIONS   Take your antibiotics as directed. Finish them even if you start to feel better.  Keep the infected arm or leg elevated to reduce swelling.  Apply a warm cloth to the affected area up to 4 times per day to relieve pain.  Only take over-the-counter or prescription medicines for pain, discomfort, or fever as directed by your caregiver.  Keep all follow-up appointments as directed by your caregiver. SEEK MEDICAL CARE IF:   You notice red streaks coming from the infected area.  Your red area gets larger or turns dark in color.  Your bone or joint underneath the infected area becomes painful after the skin has healed.  Your infection returns in the same area or another area.  You notice a swollen bump in the infected area.  You develop new symptoms. SEEK IMMEDIATE MEDICAL CARE IF:   You have a fever.  You feel very sleepy.  You develop vomiting or diarrhea.  You have a general ill feeling (malaise) with muscle aches and  pains. MAKE SURE YOU:   Understand these instructions.  Will watch your condition.  Will get help right away if you are not doing well or get worse. Document Released: 10/23/2004 Document Revised: 07/15/2011 Document Reviewed: 03/31/2011 [facility] Patient Information 2014 [place].  Tick Bite Information Ticks are insects that attach themselves to the skin and draw blood for food. There are various types of ticks. Common types include wood ticks and deer ticks. Most ticks live in shrubs and grassy areas. Ticks can climb onto your body when you make contact with leaves or grass where the tick is waiting. The most common places on the body for ticks to attach themselves are the scalp, neck, armpits, waist, and groin. Most tick bites are harmless, but sometimes ticks carry germs that cause diseases. These germs can be spread to a person during the tick's feeding process. The chance of a disease spreading through a tick bite depends on:   The type of tick.  Time of year.   How long the tick is attached.   Geographic location.  HOW CAN YOU PREVENT TICK BITES? Take these steps to help prevent tick bites when you are outdoors:  Wear protective clothing. Long sleeves and long pants are best.   Wear white clothes so you can see ticks more easily.  Tuck your pant legs into your socks.   If walking on a trail, stay in the middle of the trail to avoid brushing against bushes.  Avoid walking through areas with long  grass.  Put insect repellent on all exposed skin and along boot tops, pant legs, and sleeve cuffs.   Check clothing, hair, and skin repeatedly and before going inside.   Brush off any ticks that are not attached.  Take a shower or bath as soon as possible after being outdoors.  WHAT IS THE PROPER WAY TO REMOVE A TICK? Ticks should be removed as soon as possible to help prevent diseases caused by tick bites. 1. If latex gloves are available, put them on before  trying to remove a tick.  2. Using fine-point tweezers, grasp the tick as close to the skin as possible. You may also use curved forceps or a tick removal tool. Grasp the tick as close to its head as possible. Avoid grasping the tick on its body. 3. Pull gently with steady upward pressure until the tick lets go. Do not twist the tick or jerk it suddenly. This may break off the tick's head or mouth parts. 4. Do not squeeze or crush the tick's body. This could force disease-carrying fluids from the tick into your body.  5. After the tick is removed, wash the bite area and your hands with soap and water or other disinfectant such as alcohol. 6. Apply a small amount of antiseptic cream or ointment to the bite site.  7. Wash and disinfect any instruments that were used.  Do not try to remove a tick by applying a hot match, petroleum jelly, or fingernail polish to the tick. These methods do not work and may increase the chances of disease being spread from the tick bite.  WHEN SHOULD YOU SEEK MEDICAL CARE? Contact your health care provider if you are unable to remove a tick from your skin or if a part of the tick breaks off and is stuck in the skin.  After a tick bite, you need to be aware of signs and symptoms that could be related to diseases spread by ticks. Contact your health care provider if you develop any of the following in the days or weeks after the tick bite:  Unexplained fever.  Rash. A circular rash that appears days or weeks after the tick bite may indicate the possibility of Lyme disease. The rash may resemble a target with a bull's-eye and may occur at a different part of your body than the tick bite.  Redness and swelling in the area of the tick bite.   Tender, swollen lymph glands.   Diarrhea.   Weight loss.   Cough.   Fatigue.   Muscle, joint, or bone pain.   Abdominal pain.   Headache.   Lethargy or a change in your level of consciousness.  Difficulty  walking or moving your legs.   Numbness in the legs.   Paralysis.  Shortness of breath.   Confusion.   Repeated vomiting.  Document Released: 01/11/2000 Document Revised: 11/03/2012 Document Reviewed: 06/23/2012 [facility] Patient Information 2014 [place].

## 2013-06-21 NOTE — Progress Notes (Addendum)
Subjective:    Patient ID: Samuel Macias, male    DOB: 09-Sep-1955, 58 y.o.   MRN: [id number] This chart was scribed for [name], MD by Anastasia Pall, ED Scribe. This patient was seen in room 12 and the patient's care was started at 11:45 AM.  Chief Complaint  Patient presents with  . Tick Removal    Got tick bite Sunday, now has a red and swollen area near groin   HPI Samuel Macias is a 58 y.o. male Pt presents for a tick bite to his left hip, onset 2 days ago. He reports noticing the tick 2 mornings ago after waking up after extensive mowing the day before. He reports having a hard time removing the tick, states he removed it with his hands. He reports another area around his left groin with gradually increasing redness and swelling. He denies noticing a tick in that area. He denies having used any ointments for the areas. He denies fever, chills, headache, nausea, vomiting, abdominal pain, and any other associated symptoms.  PCP - No PCP Per Patient  Patient Active Problem List   Diagnosis Date Noted  . Hypertension 03/01/2011  . Hyperlipidemia 03/01/2011   Past Medical History  Diagnosis Date  . Hypertension   . Hyperlipidemia    Past Surgical History  Procedure Laterality Date  . Pain block      Herniated disc repair   No Known Allergies Prior to Admission medications   Medication Sig Start Date End Date Taking? Authorizing Provider  lisinopril (PRINIVIL,ZESTRIL) 10 MG tablet Take 1 tablet (10 mg total) by mouth daily. 02/12/13  Yes Ellison Carwin, MD  metoprolol succinate (TOPROL-XL) 50 MG 24 hr tablet TAKE 1 TABLET BY MOUTH EVERY DAY 02/12/13  Yes Ellison Carwin, MD  rosuvastatin (CRESTOR) 20 MG tablet Take 1 tablet (20 mg total) by mouth daily. 02/13/13  Yes Ellison Carwin, MD   Review of Systems  Constitutional: Negative for fever and chills.  Gastrointestinal: Negative for nausea, vomiting and abdominal pain.  Skin: Positive for color change and wound.         Tick bite to left hip and area of redness and swelling over left groin.   Neurological: Negative for headaches.      Objective:   Physical Exam  Nursing note and vitals reviewed. Constitutional: He is oriented to person, place, and time. He appears well-developed and well-nourished. No distress.  HENT:  Head: Normocephalic and atraumatic.  Eyes: EOM are normal.  Neck: Neck supple.  Cardiovascular: Normal rate.   Pulmonary/Chest: Effort normal. No respiratory distress.  Musculoskeletal: Normal range of motion.  Neurological: He is alert and oriented to person, place, and time.  Skin: Skin is warm and dry. There is erythema.  Left hip laterally he has approximately 1.5cm of erythema no target lesions no central clearing. There is a small puncture centrally without discharge and no tick remnants.  Left pelvic area above inguinal fold he has fullness with area erythema measuring 10x5 cm. Not extending to scrotum or penis. Not extending past inguinal fold. No apparent inguinal LAD.  Both areas outlined with marker.   Psychiatric: He has a normal mood and affect. His behavior is normal.   BP 140/84  Pulse 68  Temp(Src) 97.7 F (36.5 C) (Oral)  Resp 16  Ht 5' 5.5" (1.664 m)  Wt 158 lb (71.668 kg)  BMI 25.88 kg/m2  SpO2 98%     Assessment & Plan:    Samuel Macias  is a 58 y.o. male Tick bite of groin - Plan: cefTRIAXone (ROCEPHIN) injection 1 g, doxycycline (VIBRA-TABS) 100 MG tablet  Cellulitis of groin - Plan: cefTRIAXone (ROCEPHIN) injection 1 g, doxycycline (VIBRA-TABS) 100 MG tablet   Meds ordered this encounter  Medications  . cefTRIAXone (ROCEPHIN) injection 1 g    Sig:     Order Specific Question:  Antibiotic Indication:    Answer:  Cellulitis  . doxycycline (VIBRA-TABS) 100 MG tablet    Sig: Take 1 tablet (100 mg total) by mouth 2 (two) times daily.    Dispense:  20 tablet    Refill:  0   Suspected cellulitis after initial tick bite.  Afebrile, and no  target lesion. Tick likely in place less than 48hrs.   - rocephin 1gram IM, doxycycline, and recehck in 48 hours, sooner if notices more than 1 inch of spread beyond outline.  Rtc/er precautions.     Patient Instructions  Start antibiotic after leaving office and 2nd dose tonight. If redness spreading more than an inch from line tomorrow - return for recheck, otherwise return in 48 hours for repeat eval of infection.  Return to the clinic or go to the nearest emergency room if any of your symptoms worsen or new symptoms occur.  Cellulitis Cellulitis is an infection of the skin and the tissue beneath it. The infected area is usually red and tender. Cellulitis occurs most often in the arms and lower legs.  CAUSES  Cellulitis is caused by bacteria that enter the skin through cracks or cuts in the skin. The most common types of bacteria that cause cellulitis are Staphylococcus and Streptococcus. SYMPTOMS   Redness and warmth.  Swelling.  Tenderness or pain.  Fever. DIAGNOSIS  Your caregiver can usually determine what is wrong based on a physical exam. Blood tests may also be done. TREATMENT  Treatment usually involves taking an antibiotic medicine. HOME CARE INSTRUCTIONS   Take your antibiotics as directed. Finish them even if you start to feel better.  Keep the infected arm or leg elevated to reduce swelling.  Apply a warm cloth to the affected area up to 4 times per day to relieve pain.  Only take over-the-counter or prescription medicines for pain, discomfort, or fever as directed by your caregiver.  Keep all follow-up appointments as directed by your caregiver. SEEK MEDICAL CARE IF:   You notice red streaks coming from the infected area.  Your red area gets larger or turns dark in color.  Your bone or joint underneath the infected area becomes painful after the skin has healed.  Your infection returns in the same area or another area.  You notice a swollen bump in the  infected area.  You develop new symptoms. SEEK IMMEDIATE MEDICAL CARE IF:   You have a fever.  You feel very sleepy.  You develop vomiting or diarrhea.  You have a general ill feeling (malaise) with muscle aches and pains. MAKE SURE YOU:   Understand these instructions.  Will watch your condition.  Will get help right away if you are not doing well or get worse. Document Released: 10/23/2004 Document Revised: 07/15/2011 Document Reviewed: 03/31/2011 [facility] Patient Information 2014 [place].  Tick Bite Information Ticks are insects that attach themselves to the skin and draw blood for food. There are various types of ticks. Common types include wood ticks and deer ticks. Most ticks live in shrubs and grassy areas. Ticks can climb onto your body when you make contact with leaves  or grass where the tick is waiting. The most common places on the body for ticks to attach themselves are the scalp, neck, armpits, waist, and groin. Most tick bites are harmless, but sometimes ticks carry germs that cause diseases. These germs can be spread to a person during the tick's feeding process. The chance of a disease spreading through a tick bite depends on:   The type of tick.  Time of year.   How long the tick is attached.   Geographic location.  HOW CAN YOU PREVENT TICK BITES? Take these steps to help prevent tick bites when you are outdoors:  Wear protective clothing. Long sleeves and long pants are best.   Wear white clothes so you can see ticks more easily.  Tuck your pant legs into your socks.   If walking on a trail, stay in the middle of the trail to avoid brushing against bushes.  Avoid walking through areas with long grass.  Put insect repellent on all exposed skin and along boot tops, pant legs, and sleeve cuffs.   Check clothing, hair, and skin repeatedly and before going inside.   Brush off any ticks that are not attached.  Take a shower or bath as  soon as possible after being outdoors.  WHAT IS THE PROPER WAY TO REMOVE A TICK? Ticks should be removed as soon as possible to help prevent diseases caused by tick bites. 1. If latex gloves are available, put them on before trying to remove a tick.  2. Using fine-point tweezers, grasp the tick as close to the skin as possible. You may also use curved forceps or a tick removal tool. Grasp the tick as close to its head as possible. Avoid grasping the tick on its body. 3. Pull gently with steady upward pressure until the tick lets go. Do not twist the tick or jerk it suddenly. This may break off the tick's head or mouth parts. 4. Do not squeeze or crush the tick's body. This could force disease-carrying fluids from the tick into your body.  5. After the tick is removed, wash the bite area and your hands with soap and water or other disinfectant such as alcohol. 6. Apply a small amount of antiseptic cream or ointment to the bite site.  7. Wash and disinfect any instruments that were used.  Do not try to remove a tick by applying a hot match, petroleum jelly, or fingernail polish to the tick. These methods do not work and may increase the chances of disease being spread from the tick bite.  WHEN SHOULD YOU SEEK MEDICAL CARE? Contact your health care provider if you are unable to remove a tick from your skin or if a part of the tick breaks off and is stuck in the skin.  After a tick bite, you need to be aware of signs and symptoms that could be related to diseases spread by ticks. Contact your health care provider if you develop any of the following in the days or weeks after the tick bite:  Unexplained fever.  Rash. A circular rash that appears days or weeks after the tick bite may indicate the possibility of Lyme disease. The rash may resemble a target with a bull's-eye and may occur at a different part of your body than the tick bite.  Redness and swelling in the area of the tick bite.    Tender, swollen lymph glands.   Diarrhea.   Weight loss.   Cough.   Fatigue.  Muscle, joint, or bone pain.   Abdominal pain.   Headache.   Lethargy or a change in your level of consciousness.  Difficulty walking or moving your legs.   Numbness in the legs.   Paralysis.  Shortness of breath.   Confusion.   Repeated vomiting.  Document Released: 01/11/2000 Document Revised: 11/03/2012 Document Reviewed: 06/23/2012 [facility] Patient Information 2014 [place].     I personally performed the services described in this documentation, which was scribed in my presence. The recorded information has been reviewed and considered, and addended by me as needed.    After visit, on way out the door - asked about difficulty with erections - noticed more after changing dose of metoprolol. Able to achieve erection, just not lasting as long.  Encouraged to return to discuss this further, including possible testosterone testing or change in meds.  Would not recommend changing dose of metoprolol as borderline control on today's reading. Discussed return in next 2 months for blood work and review of above. Noted last blood test with borderline elevated glucose - can repeat testing at that time. Understanding expressed.

## 2013-06-27 ENCOUNTER — Encounter: Payer: Self-pay | Admitting: Internal Medicine

## 2013-07-18 ENCOUNTER — Ambulatory Visit (AMBULATORY_SURGERY_CENTER): Payer: Self-pay | Admitting: *Deleted

## 2013-07-18 VITALS — Ht 66.0 in | Wt 158.6 lb

## 2013-07-18 DIAGNOSIS — Z86010 Personal history of colonic polyps: Secondary | ICD-10-CM

## 2013-07-18 MED ORDER — NA SULFATE-K SULFATE-MG SULF 17.5-3.13-1.6 GM/177ML PO SOLN: ORAL | Status: DC

## 2013-07-18 NOTE — Progress Notes (Signed)
Patient denies any allergies to eggs or soy. Patient denies any problems with anesthesia/sedation. Patient denies any oxygen use at home and does not take any diet/weight loss medications. EMMI education assisgned to patient on colonoscopy, this was explained and instructions given to patient. 

## 2013-08-03 ENCOUNTER — Ambulatory Visit (AMBULATORY_SURGERY_CENTER): Payer: BC Managed Care – PPO | Admitting: Internal Medicine

## 2013-08-03 ENCOUNTER — Encounter: Payer: Self-pay | Admitting: Internal Medicine

## 2013-08-03 VITALS — BP 148/94 | HR 53 | Temp 98.2°F | Resp 18 | Ht 66.0 in | Wt 158.0 lb

## 2013-08-03 DIAGNOSIS — Z86010 Personal history of colonic polyps: Secondary | ICD-10-CM

## 2013-08-03 DIAGNOSIS — D126 Benign neoplasm of colon, unspecified: Secondary | ICD-10-CM

## 2013-08-03 MED ORDER — SODIUM CHLORIDE 0.9 % IV SOLN: 500.0000 mL | INTRAVENOUS | Status: DC

## 2013-08-03 NOTE — Patient Instructions (Addendum)
I found and removed one tiny polyp that looks benign. I will let you know pathology results and when to have another routine colonoscopy by mail.  I appreciate the opportunity to care for you. Gatha Mayer, MD, [facility]  Handout given on polyps.  YOU HAD AN ENDOSCOPIC PROCEDURE TODAY AT [facility]: Refer to the procedure report that was given to you for any specific questions about what was found during the examination.  If the procedure report does not answer your questions, please call your gastroenterologist to clarify.  If you requested that your care partner not be given the details of your procedure findings, then the procedure report has been included in a sealed envelope for you to review at your convenience later.  YOU SHOULD EXPECT: Some feelings of bloating in the abdomen. Passage of more gas than usual.  Walking can help get rid of the air that was put into your GI tract during the procedure and reduce the bloating. If you had a lower endoscopy (such as a colonoscopy or flexible sigmoidoscopy) you may notice spotting of blood in your stool or on the toilet paper. If you underwent a bowel prep for your procedure, then you may not have a normal bowel movement for a few days.  DIET: Your first meal following the procedure should be a light meal and then it is ok to progress to your normal diet.  A half-sandwich or bowl of soup is an example of a good first meal.  Heavy or fried foods are harder to digest and may make you feel nauseous or bloated.  Likewise meals heavy in dairy and vegetables can cause extra gas to form and this can also increase the bloating.  Drink plenty of fluids but you should avoid alcoholic beverages for 24 hours.  ACTIVITY: Your care partner should take you home directly after the procedure.  You should plan to take it easy, moving slowly for the rest of the day.  You can resume normal activity the day after the procedure however you should NOT DRIVE or  use heavy machinery for 24 hours (because of the sedation medicines used during the test).    SYMPTOMS TO REPORT IMMEDIATELY: A gastroenterologist can be reached at any hour.  During normal business hours, 8:30 AM to 5:00 PM Monday through Friday, call [phone].  After hours and on weekends, please call the GI answering service at [phone] who will take a message and have the physician on call contact you.   Following lower endoscopy (colonoscopy or flexible sigmoidoscopy):  Excessive amounts of blood in the stool  Significant tenderness or worsening of abdominal pains  Swelling of the abdomen that is new, acute  Fever of 100F or higher  Following upper endoscopy (EGD)  Vomiting of blood or coffee ground material  New chest pain or pain under the shoulder blades  Painful or persistently difficult swallowing  New shortness of breath  Fever of 100F or higher  Black, tarry-looking stools  FOLLOW UP: If any biopsies were taken you will be contacted by phone or by letter within the next 1-3 weeks.  Call your gastroenterologist if you have not heard about the biopsies in 3 weeks.  Our staff will call the home number listed on your records the next business day following your procedure to check on you and address any questions or concerns that you may have at that time regarding the information given to you following your procedure. This is a  courtesy call and so if there is no answer at the home number and we have not heard from you through the emergency physician on call, we will assume that you have returned to your regular daily activities without incident.  SIGNATURES/CONFIDENTIALITY: You and/or your care partner have signed paperwork which will be entered into your electronic medical record.  These signatures attest to the fact that that the information above on your After Visit Summary has been reviewed and is understood.  Full responsibility of the confidentiality of this  discharge information lies with you and/or your care-partner.

## 2013-08-03 NOTE — Progress Notes (Signed)
A/ox3, pleased with MAC, report to RN 

## 2013-08-03 NOTE — Op Note (Signed)
[place]  [address] [place], [id number]   COLONOSCOPY PROCEDURE REPORT  PATIENT: Samuel Macias, Samuel Macias  MR#: [id number] BIRTHDATE: 1955-07-27 , 24  yrs. old GENDER: Male ENDOSCOPIST: Gatha Mayer, MD, [facility] PROCEDURE DATE:  08/03/2013 PROCEDURE:   Colonoscopy with biopsy First Screening Colonoscopy - Avg.  risk and is 50 yrs.  old or older - No.  Prior Negative Screening - Now for repeat screening. N/A  History of Adenoma - Now for follow-up colonoscopy & has been > or = to 3 yrs.  Yes hx of adenoma.  Has been 3 or more years since last colonoscopy.  Polyps Removed Today? Yes. ASA CLASS:   Class II INDICATIONS:Patient's personal history of adenomatous colon polyps.  MEDICATIONS: propofol (Diprivan) 400mg  IV, MAC sedation, administered by CRNA, and These medications were titrated to patient response per physician's verbal order  DESCRIPTION OF PROCEDURE:   After the risks benefits and alternatives of the procedure were thoroughly explained, informed consent was obtained.  A digital rectal exam revealed no abnormalities of the rectum, A digital rectal exam revealed no prostatic nodules, and A digital rectal exam revealed the prostate was not enlarged.   The [id number]  endoscope was introduced through the anus and advanced to the cecum, which was identified by both the appendix and ileocecal valve. No adverse events experienced.   The quality of the prep was excellent using Suprep  The instrument was then slowly withdrawn as the colon was fully examined.  COLON FINDINGS: A sessile polyp measuring 2 mm in size was found in the ascending colon.  A polypectomy was performed with cold forceps.  The resection was complete and the polyp tissue was completely retrieved.   The colon mucosa was otherwise normal.   A right colon retroflexion was performed.  Retroflexed views revealed no abnormalities. The time to cecum=2 minutes 49 seconds. Withdrawal time=8  minutes 05 seconds.  The scope was withdrawn and the procedure completed. COMPLICATIONS: There were no complications.  ENDOSCOPIC IMPRESSION: 1.   Sessile polyp measuring 2 mm in size was found in the ascending colon; polypectomy was performed with cold forceps 2.   The colon mucosa was otherwise normal  RECOMMENDATIONS: 1.  Timing of repeat colonoscopy will be determined by pathology findings. 2.   5-10 years likely   eSigned:  Gatha Mayer, MD, [facility] 08/03/2013 12:05 PM  cc: The Patient

## 2013-08-03 NOTE — Progress Notes (Signed)
Called to room to assist during endoscopic procedure.  Patient ID and intended procedure confirmed with present staff. Received instructions for my participation in the procedure from the performing physician.  

## 2013-08-04 ENCOUNTER — Telehealth: Payer: Self-pay | Admitting: *Deleted

## 2013-08-04 NOTE — Telephone Encounter (Signed)
  Follow up Call-  Call back number 08/03/2013  Post procedure Call Back phone  # [phone] home or cell [phone]  Permission to leave phone message Yes     Patient questions:  Do you have a fever, pain , or abdominal swelling? No. Pain Score  0 *  Have you tolerated food without any problems? Yes.    Have you been able to return to your normal activities? Yes.    Do you have any questions about your discharge instructions: Diet   No. Medications  No. Follow up visit  No.  Do you have questions or concerns about your Care? No.  Actions: * If pain score is 4 or above: No action needed, pain <4.

## 2013-08-08 ENCOUNTER — Encounter: Payer: Self-pay | Admitting: Internal Medicine

## 2013-08-08 NOTE — Progress Notes (Signed)
Quick Note:  2 mm adenoma - repeat colon 2022 ______

## 2013-11-04 ENCOUNTER — Encounter: Payer: Self-pay | Admitting: Internal Medicine

## 2013-12-15 ENCOUNTER — Other Ambulatory Visit: Payer: Self-pay | Admitting: Dermatology

## 2014-02-20 ENCOUNTER — Ambulatory Visit (INDEPENDENT_AMBULATORY_CARE_PROVIDER_SITE_OTHER): Payer: BLUE CROSS/BLUE SHIELD | Admitting: Physician Assistant

## 2014-02-20 VITALS — BP 138/82 | HR 78 | Temp 98.6°F | Resp 18 | Ht 67.25 in | Wt 168.0 lb

## 2014-02-20 DIAGNOSIS — R059 Cough, unspecified: Secondary | ICD-10-CM

## 2014-02-20 DIAGNOSIS — J019 Acute sinusitis, unspecified: Secondary | ICD-10-CM

## 2014-02-20 DIAGNOSIS — R05 Cough: Secondary | ICD-10-CM

## 2014-02-20 MED ORDER — AMOXICILLIN-POT CLAVULANATE 875-125 MG PO TABS: 1.0000 | ORAL_TABLET | Freq: Two times a day (BID) | ORAL | Status: AC

## 2014-02-20 MED ORDER — HYDROCOD POLST-CHLORPHEN POLST 10-8 MG/5ML PO LQCR: 5.0000 mL | Freq: Two times a day (BID) | ORAL | Status: DC | PRN

## 2014-02-20 MED ORDER — GUAIFENESIN ER 1200 MG PO TB12: 1.0000 | ORAL_TABLET | Freq: Two times a day (BID) | ORAL | Status: DC | PRN

## 2014-02-20 NOTE — Patient Instructions (Signed)
Get plenty of rest and drink at least 64 ounces of water daily. 

## 2014-02-20 NOTE — Progress Notes (Signed)
Subjective:    Patient ID: Samuel Macias, male    DOB: 1955/03/21, 59 y.o.   MRN: [id number]   PCP: Wendie Agreste, MD  Chief Complaint  Patient presents with  . chest congestion    cough, semi-productive cough started out as a dark green color. x1week  . Chills  . Generalized Body Aches    No Known Allergies  Patient Active Problem List   Diagnosis Date Noted  . Hypertension 03/01/2011  . Hyperlipidemia 03/01/2011  . Personal history of colonic polyps - adenomas 11/05/2006    Prior to Admission medications   Medication Sig Start Date End Date Taking? Authorizing Provider  aspirin 81 MG tablet Take 81 mg by mouth daily.   Yes Historical Provider, MD  lisinopril (PRINIVIL,ZESTRIL) 10 MG tablet Take 1 tablet (10 mg total) by mouth daily. 02/12/13  Yes Roselee Culver, MD  metoprolol succinate (TOPROL-XL) 50 MG 24 hr tablet TAKE 1 TABLET BY MOUTH EVERY DAY 02/12/13  Yes Roselee Culver, MD  Omega-3 Fatty Acids (FISH OIL) 1200 MG CAPS Take 1 capsule by mouth daily.   Yes Historical Provider, MD  rosuvastatin (CRESTOR) 20 MG tablet Take 1 tablet (20 mg total) by mouth daily. 02/13/13  Yes Roselee Culver, MD  fluorouracil (EFUDEX) 5 % cream  12/15/13   Historical Provider, MD    Medical, Surgical, Family and Social History reviewed and updated.  HPI  Patient presents with about 10 days of illness. Began with body aches and subjective fever/chills. Cough. Now intermittently productive of greenish sputum. Thought he was improving after several days, but then worsened again. OTC cough and cold products without benefit. Had some nasal/sinus congestion/pressure a few days ago, now resolved. His wife told him he's congested. Some throat irritation. No ear fullness, popping or pain. No nausea/vomiting or diarrhea. Sometimes coughs so hard he gags.  His son had the same symptoms, onset the same time, but his have resolved completely.   Review of Systems As  above.    Objective:   Physical Exam  Constitutional: He is oriented to person, place, and time. Vital signs are normal. He appears well-developed and well-nourished. No distress.  BP 138/82 mmHg  Pulse 78  Temp(Src) 98.6 F (37 C) (Oral)  Resp 18  Ht 5' 7.25" (1.708 m)  Wt 168 lb (76.204 kg)  BMI 26.12 kg/m2  SpO2 96%   HENT:  Head: Normocephalic and atraumatic.  Left Ear: Hearing, tympanic membrane, external ear and ear canal normal.  Nose: Mucosal edema present.  No foreign bodies. Right sinus exhibits no maxillary sinus tenderness and no frontal sinus tenderness. Left sinus exhibits no maxillary sinus tenderness and no frontal sinus tenderness.  Mouth/Throat: Uvula is midline, oropharynx is clear and moist and mucous membranes are normal. No uvula swelling. No oropharyngeal exudate.  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  Eyes: Conjunctivae, EOM and lids are normal. Pupils are equal, round, and reactive to light. Right eye exhibits no discharge. Left eye exhibits no discharge. No scleral icterus.  Neck: Trachea normal, normal range of motion and full passive range of motion without pain. Neck supple. No thyroid mass and no thyromegaly present.  Cardiovascular: Normal rate, regular rhythm and normal heart sounds.   Pulmonary/Chest: Effort normal and breath sounds normal.  Musculoskeletal:       Cervical back: Normal.  Lymphadenopathy:       Head (right side): No submandibular, no tonsillar, no preauricular, no posterior auricular and no occipital  adenopathy present.       Head (left side): No submandibular, no tonsillar, no preauricular and no occipital adenopathy present.    He has no cervical adenopathy.       Right: No supraclavicular adenopathy present.       Left: No supraclavicular adenopathy present.  Neurological: He is alert and oriented to person, place, and time. He has normal strength. No cranial nerve deficit or sensory deficit.  Skin: Skin is warm, dry  and intact. No rash noted.  Psychiatric: He has a normal mood and affect. His speech is normal and behavior is normal.          Assessment & Plan:  1. Cough Due to post-nasal drainage. - chlorpheniramine-HYDROcodone (TUSSIONEX PENNKINETIC ER) 10-8 MG/5ML LQCR; Take 5 mLs by mouth every 12 (twelve) hours as needed for cough (cough).  Dispense: 100 mL; Refill: 0  2. Acute sinusitis, recurrence not specified, unspecified location Supportive care. Anticipatory guidance.  - amoxicillin-clavulanate (AUGMENTIN) 875-125 MG per tablet; Take 1 tablet by mouth 2 (two) times daily.  Dispense: 20 tablet; Refill: 0 - Guaifenesin (MUCINEX MAXIMUM STRENGTH) 1200 MG TB12; Take 1 tablet (1,200 mg total) by mouth every 12 (twelve) hours as needed.  Dispense: 14 tablet; Refill: 1  He is due for a CPE and fasting labs for follow-up of hyperlipidemia and hyperglycemia. He thinks he has about 3 weeks worth of his cholesterol medications left. We'll see if we can get him scheduled in Dr. Vonna Kotyk clinic in that time period. If not, I am happy to refill the medication until his appointment.  Fara Chute, PA-C Physician Assistant-Certified [facility]

## 2014-02-22 ENCOUNTER — Telehealth: Payer: Self-pay

## 2014-02-22 MED ORDER — BENZONATATE 100 MG PO CAPS: 100.0000 mg | ORAL_CAPSULE | Freq: Three times a day (TID) | ORAL | Status: DC | PRN

## 2014-02-22 NOTE — Telephone Encounter (Signed)
Spoke with pt, advised message from Chelle. Pt understood. 

## 2014-02-22 NOTE — Telephone Encounter (Signed)
Pt called to request a refill on cough syrup that he was prescribed. States he is out of the medication. Also stated that his son has also been sick and has used some of it.   Please advise. CB# [phone]

## 2014-02-22 NOTE — Telephone Encounter (Signed)
Pt was given 100 ml 2 days ago. This should have lasted him 10 days. Chelle, please advise

## 2014-02-22 NOTE — Telephone Encounter (Signed)
Meds ordered this encounter  Medications  . benzonatate (TESSALON) 100 MG capsule    Sig: Take 1-2 capsules (100-200 mg total) by mouth 3 (three) times daily as needed for cough.    Dispense:  40 capsule    Refill:  0    Order Specific Question:  Supervising Provider    Answer:  DOOLITTLE, ROBERT P [[id number]]    I'm not comfortable refilling this product so soon. Even if he was sharing it with his son, he should still have just over half of the doses left.  Try tessalon perles.

## 2014-03-02 ENCOUNTER — Telehealth: Payer: Self-pay | Admitting: Family Medicine

## 2014-03-02 DIAGNOSIS — E785 Hyperlipidemia, unspecified: Secondary | ICD-10-CM

## 2014-03-02 MED ORDER — ROSUVASTATIN CALCIUM 20 MG PO TABS: 20.0000 mg | ORAL_TABLET | Freq: Every day | ORAL | Status: DC

## 2014-03-02 NOTE — Telephone Encounter (Signed)
-----   Message from Thomasenia Sales, [place] sent at 02/27/2014  9:22 AM EST ----- Patient has an appointment for CPE with you 04/17/2014. He needs a refill on his crestor (25 mg 1 qd) to hold him over until then. Per pt, he has 11 pills left. Thanks.

## 2014-03-02 NOTE — Telephone Encounter (Signed)
#  [phone] sent.  Keep follow up as planned.

## 2014-03-03 NOTE — Telephone Encounter (Signed)
Pt is still needing the crestor and lisinopril (PRINIVIL,ZESTRIL) 10 MG tablet [[id number]] to [facility] [place], [place]

## 2014-03-04 MED ORDER — ROSUVASTATIN CALCIUM 20 MG PO TABS: 20.0000 mg | ORAL_TABLET | Freq: Every day | ORAL | Status: DC

## 2014-03-04 NOTE — Telephone Encounter (Signed)
lmom that rx was sent in

## 2014-03-07 ENCOUNTER — Ambulatory Visit (INDEPENDENT_AMBULATORY_CARE_PROVIDER_SITE_OTHER): Payer: BLUE CROSS/BLUE SHIELD | Admitting: Family Medicine

## 2014-03-07 VITALS — BP 118/76 | HR 58 | Temp 97.6°F | Resp 18 | Ht 67.0 in | Wt 162.0 lb

## 2014-03-07 DIAGNOSIS — R739 Hyperglycemia, unspecified: Secondary | ICD-10-CM

## 2014-03-07 DIAGNOSIS — E785 Hyperlipidemia, unspecified: Secondary | ICD-10-CM

## 2014-03-07 DIAGNOSIS — I10 Essential (primary) hypertension: Secondary | ICD-10-CM

## 2014-03-07 LAB — LIPID PANEL
CHOLESTEROL: 221 mg/dL — AB (ref 0–200)
HDL: 62 mg/dL (ref >39)
LDL CALC: 138 mg/dL — AB (ref 0–99)
TRIGLYCERIDES: 106 mg/dL (ref <150)
Total CHOL/HDL Ratio: 3.6 Ratio
VLDL: 21 mg/dL (ref 0–40)

## 2014-03-07 LAB — POCT GLYCOSYLATED HEMOGLOBIN (HGB A1C): Hemoglobin A1C: 5.9

## 2014-03-07 LAB — COMPLETE METABOLIC PANEL WITH GFR
ALBUMIN: 4.2 g/dL (ref 3.5–5.2)
ALK PHOS: 50 U/L (ref 39–117)
ALT: 30 U/L (ref 0–53)
AST: 25 U/L (ref 0–37)
BUN: 12 mg/dL (ref 6–23)
CO2: 28 mEq/L (ref 19–32)
Calcium: 9.9 mg/dL (ref 8.4–10.5)
Chloride: 98 mEq/L (ref 96–112)
Creat: 0.75 mg/dL (ref 0.50–1.35)
GFR, Est African American: >89 mL/min
GFR, Est Non African American: >89 mL/min
GLUCOSE: 105 mg/dL — AB (ref 70–99)
POTASSIUM: 5.2 meq/L (ref 3.5–5.3)
SODIUM: 134 meq/L — AB (ref 135–145)
TOTAL PROTEIN: 7.2 g/dL (ref 6.0–8.3)
Total Bilirubin: 0.8 mg/dL (ref 0.2–1.2)

## 2014-03-07 LAB — GLUCOSE, POCT (MANUAL RESULT ENTRY): POC GLUCOSE: 112 mg/dL — AB (ref 70–99)

## 2014-03-07 MED ORDER — ROSUVASTATIN CALCIUM 20 MG PO TABS: 20.0000 mg | ORAL_TABLET | Freq: Every day | ORAL | Status: DC

## 2014-03-07 MED ORDER — LISINOPRIL 10 MG PO TABS: 10.0000 mg | ORAL_TABLET | Freq: Every day | ORAL | Status: DC

## 2014-03-07 MED ORDER — METOPROLOL SUCCINATE ER 50 MG PO TB24: ORAL_TABLET | ORAL | Status: DC

## 2014-03-07 NOTE — Progress Notes (Addendum)
Subjective:    Patient ID: Samuel Macias, male    DOB: Aug 11, 1955, 59 y.o.   MRN: [id number] This chart was scribed for [name], MD by Zola Button, Medical Scribe. This patient was seen in Room 10 and the patient's care was started at 8:20 AM.   HPI HPI Comments: Samuel Macias is a 59 y.o. male with a hx of HTN and HLD who presents to the [facility] for medication refill.  Hypertension: He takes lisinopril 10 mg QD, metoprolol 50 mg QD as well as ASA 81 mg QD. He has a physical scheduled with me in March. Most recent electrolytes in January 2015. Kidney function normal, but glucose slightly elevated at 108. Patient has been getting BP readings around 162/92 at home, but he thinks the machine he has been using may be inaccurate. He denies lightheadedness, dizziness, chest pain, SOB and difficulty breathing.  Hyperlipidemia: Takes Crestor 20 mg QD. Last lipids 1 year ago, were normal.  Lab Results  Component Value Date   CHOL 196 02/12/2013   HDL 91 02/12/2013   LDLCALC 94 02/12/2013   TRIG 55 02/12/2013   CHOLHDL 2.2 02/12/2013     Patient Active Problem List   Diagnosis Date Noted  . Hypertension 03/01/2011  . Hyperlipidemia 03/01/2011  . Personal history of colonic polyps - adenomas 11/05/2006   Past Medical History  Diagnosis Date  . Hypertension   . Hyperlipidemia   . Personal history of colonic polyps - adenomas 11/05/2006   Past Surgical History  Procedure Laterality Date  . Pain block      Herniated disc repair  . Wrist surgery      tendon  . External ear surgery      as child   No Known Allergies Prior to Admission medications   Medication Sig Start Date End Date Taking? Authorizing Provider  aspirin 81 MG tablet Take 81 mg by mouth daily.   Yes Historical Provider, MD  fluorouracil (EFUDEX) 5 % cream  12/15/13  Yes Historical Provider, MD  lisinopril (PRINIVIL,ZESTRIL) 10 MG tablet Take 1 tablet (10 mg total) by mouth daily.  02/12/13  Yes Roselee Culver, MD  metoprolol succinate (TOPROL-XL) 50 MG 24 hr tablet TAKE 1 TABLET BY MOUTH EVERY DAY 02/12/13  Yes Roselee Culver, MD  Omega-3 Fatty Acids (FISH OIL) 1200 MG CAPS Take 1 capsule by mouth daily.   Yes Historical Provider, MD  rosuvastatin (CRESTOR) 20 MG tablet Take 1 tablet (20 mg total) by mouth daily. 03/04/14  Yes Wendie Agreste, MD  benzonatate (TESSALON) 100 MG capsule Take 1-2 capsules (100-200 mg total) by mouth 3 (three) times daily as needed for cough. Patient not taking: Reported on 03/07/2014 02/22/14   Fara Chute, PA-C  chlorpheniramine-HYDROcodone (TUSSIONEX PENNKINETIC ER) 10-8 MG/5ML LQCR Take 5 mLs by mouth every 12 (twelve) hours as needed for cough (cough). Patient not taking: Reported on 03/07/2014 02/20/14   Chelle S Jeffery, PA-C  Guaifenesin (MUCINEX MAXIMUM STRENGTH) 1200 MG TB12 Take 1 tablet (1,200 mg total) by mouth every 12 (twelve) hours as needed. Patient not taking: Reported on 03/07/2014 02/20/14   Fara Chute, PA-C   History   Social History  . Marital Status: Married    Spouse Name: [facility], [place]    Number of Children: 2  . Years of Education: 12th grade   Occupational History  . retired Soil scientist    Social History Main Topics  .  Smoking status: Never Smoker   . Smokeless tobacco: Never Used  . Alcohol Use: 6.0 oz/week    10 Cans of beer per week  . Drug Use: No  . Sexual Activity: Not on file   Other Topics Concern  . Not on file   Social History Narrative   Lives with his wife and son, [name]. His daughter lives independently in [place], [place].     Review of Systems  Constitutional: Negative for fatigue and unexpected weight change.  Eyes: Negative for visual disturbance.  Respiratory: Negative for cough, chest tightness and shortness of breath.   Cardiovascular: Negative for chest pain, palpitations and leg swelling.  Gastrointestinal: Negative for abdominal pain and blood in stool.    Neurological: Negative for dizziness, light-headedness and headaches.       Objective:   Physical Exam  Constitutional: He is oriented to person, place, and time. He appears well-developed and well-nourished.  HENT:  Head: Normocephalic and atraumatic.  Eyes: EOM are normal. Pupils are equal, round, and reactive to light.  Neck: No JVD present. Carotid bruit is not present.  Cardiovascular: Normal rate, regular rhythm and normal heart sounds.   No murmur heard. Pulmonary/Chest: Effort normal and breath sounds normal. He has no rales.  Musculoskeletal: He exhibits no edema.  Neurological: He is alert and oriented to person, place, and time.  Skin: Skin is warm and dry.  Psychiatric: He has a normal mood and affect.  Vitals reviewed.     Filed Vitals:   03/07/14 0811  BP: 118/76  Pulse: 58  Temp: 97.6 F (36.4 C)  TempSrc: Oral  Resp: 18  Height: 5\' 7"  (1.702 m)  Weight: 162 lb (73.483 kg)  SpO2: 98%   Results for orders placed or performed in visit on 03/07/14  POCT glucose (manual entry)  Result Value Ref Range   POC Glucose 112 (A) 70 - 99 mg/dl  POCT glycosylated hemoglobin (Hb A1C)  Result Value Ref Range   Hemoglobin A1C 5.9        Assessment & Plan:   Samuel Macias is a 59 y.o. male Essential hypertension - Plan: COMPLETE METABOLIC PANEL WITH GFR, lisinopril (PRINIVIL,ZESTRIL) 10 MG tablet, metoprolol succinate (TOPROL-XL) 50 MG 24 hr tablet  - stable. Denied orthostatic or other sx's of bradycardia. No med changes - rf x 1 yr.   Hyperlipidemia - Plan: COMPLETE METABOLIC PANEL WITH GFR, Lipid panel, rosuvastatin (CRESTOR) 20 MG tablet  -borderline prior. Refilled same meds 1 yr, labs pending.   Hyperglycemia - Plan: POCT glucose (manual entry), POCT glycosylated hemoglobin (Hb A1C)  -prediabetes by A1c.  Diet, exercise discussed - recheck levels over next few months.   Meds ordered this encounter  Medications  . lisinopril (PRINIVIL,ZESTRIL) 10  MG tablet    Sig: Take 1 tablet (10 mg total) by mouth daily.    Dispense:  90 tablet    Refill:  3  . rosuvastatin (CRESTOR) 20 MG tablet    Sig: Take 1 tablet (20 mg total) by mouth daily.    Dispense:  90 tablet    Refill:  3  . metoprolol succinate (TOPROL-XL) 50 MG 24 hr tablet    Sig: TAKE 1 TABLET BY MOUTH EVERY DAY    Dispense:  90 tablet    Refill:  3   Patient Instructions  Your blood sugar was in "prediabetes" range. No new medicines at this time, but watch diet, exercise and recheck level in next 6 months.   No  change to other meds at this time. You should receive a call or letter about your lab results within the next week to 10 days.   Hyperglycemia Hyperglycemia occurs when the glucose (sugar) in your blood is too high. Hyperglycemia can happen for many reasons, but it most often happens to people who do not know they have diabetes or are not managing their diabetes properly.  CAUSES  Whether you have diabetes or not, there are other causes of hyperglycemia. Hyperglycemia can occur when you have diabetes, but it can also occur in other situations that you might not be as aware of, such as: Diabetes  If you have diabetes and are having problems controlling your blood glucose, hyperglycemia could occur because of some of the following reasons:  Not following your meal plan.  Not taking your diabetes medications or not taking it properly.  Exercising less or doing less activity than you normally do.  Being sick. Pre-diabetes  This cannot be ignored. Before people develop Type 2 diabetes, they almost always have "pre-diabetes." This is when your blood glucose levels are higher than normal, but not yet high enough to be diagnosed as diabetes. Research has shown that some long-term damage to the body, especially the heart and circulatory system, may already be occurring during pre-diabetes. If you take action to manage your blood glucose when you have pre-diabetes, you may  delay or prevent Type 2 diabetes from developing. Stress  If you have diabetes, you may be "diet" controlled or on oral medications or insulin to control your diabetes. However, you may find that your blood glucose is higher than usual in the hospital whether you have diabetes or not. This is often referred to as "stress hyperglycemia." Stress can elevate your blood glucose. This happens because of hormones put out by the body during times of stress. If stress has been the cause of your high blood glucose, it can be followed regularly by your caregiver. That way he/she can make sure your hyperglycemia does not continue to get worse or progress to diabetes. Steroids  Steroids are medications that act on the infection fighting system (immune system) to block inflammation or infection. One side effect can be a rise in blood glucose. Most people can produce enough extra insulin to allow for this rise, but for those who cannot, steroids make blood glucose levels go even higher. It is not unusual for steroid treatments to "uncover" diabetes that is developing. It is not always possible to determine if the hyperglycemia will go away after the steroids are stopped. A special blood test called an A1c is sometimes done to determine if your blood glucose was elevated before the steroids were started. SYMPTOMS  Thirsty.  Frequent urination.  Dry mouth.  Blurred vision.  Tired or fatigue.  Weakness.  Sleepy.  Tingling in feet or leg. DIAGNOSIS  Diagnosis is made by monitoring blood glucose in one or all of the following ways:  A1c test. This is a chemical found in your blood.  Fingerstick blood glucose monitoring.  Laboratory results. TREATMENT  First, knowing the cause of the hyperglycemia is important before the hyperglycemia can be treated. Treatment may include, but is not be limited to:  Education.  Change or adjustment in medications.  Change or adjustment in meal plan.  Treatment  for an illness, infection, etc.  More frequent blood glucose monitoring.  Change in exercise plan.  Decreasing or stopping steroids.  Lifestyle changes. HOME CARE INSTRUCTIONS   Test your blood glucose  as directed.  Exercise regularly. Your caregiver will give you instructions about exercise. Pre-diabetes or diabetes which comes on with stress is helped by exercising.  Eat wholesome, balanced meals. Eat often and at regular, fixed times. Your caregiver or nutritionist will give you a meal plan to guide your sugar intake.  Being at an ideal weight is important. If needed, losing as little as 10 to 15 pounds may help improve blood glucose levels. SEEK MEDICAL CARE IF:   You have questions about medicine, activity, or diet.  You continue to have symptoms (problems such as increased thirst, urination, or weight gain). SEEK IMMEDIATE MEDICAL CARE IF:   You are vomiting or have diarrhea.  Your breath smells fruity.  You are breathing faster or slower.  You are very sleepy or incoherent.  You have numbness, tingling, or pain in your feet or hands.  You have chest pain.  Your symptoms get worse even though you have been following your caregiver's orders.  If you have any other questions or concerns. Document Released: 07/09/2000 Document Revised: 04/07/2011 Document Reviewed: 05/12/2011 [facility] Patient Information 2015 [place], [place]. This information is not intended to replace advice given to you by your health care provider. Make sure you discuss any questions you have with your health care provider.    I personally performed the services described in this documentation, which was scribed in my presence. The recorded information has been reviewed and considered, and addended by me as needed.

## 2014-03-07 NOTE — Patient Instructions (Addendum)
Your blood sugar was in "prediabetes" range. No new medicines at this time, but watch diet, exercise and recheck level in next 6 months.   No change to other meds at this time. You should receive a call or letter about your lab results within the next week to 10 days.   Hyperglycemia Hyperglycemia occurs when the glucose (sugar) in your blood is too high. Hyperglycemia can happen for many reasons, but it most often happens to people who do not know they have diabetes or are not managing their diabetes properly.  CAUSES  Whether you have diabetes or not, there are other causes of hyperglycemia. Hyperglycemia can occur when you have diabetes, but it can also occur in other situations that you might not be as aware of, such as: Diabetes  If you have diabetes and are having problems controlling your blood glucose, hyperglycemia could occur because of some of the following reasons:  Not following your meal plan.  Not taking your diabetes medications or not taking it properly.  Exercising less or doing less activity than you normally do.  Being sick. Pre-diabetes  This cannot be ignored. Before people develop Type 2 diabetes, they almost always have "pre-diabetes." This is when your blood glucose levels are higher than normal, but not yet high enough to be diagnosed as diabetes. Research has shown that some long-term damage to the body, especially the heart and circulatory system, may already be occurring during pre-diabetes. If you take action to manage your blood glucose when you have pre-diabetes, you may delay or prevent Type 2 diabetes from developing. Stress  If you have diabetes, you may be "diet" controlled or on oral medications or insulin to control your diabetes. However, you may find that your blood glucose is higher than usual in the hospital whether you have diabetes or not. This is often referred to as "stress hyperglycemia." Stress can elevate your blood glucose. This happens  because of hormones put out by the body during times of stress. If stress has been the cause of your high blood glucose, it can be followed regularly by your caregiver. That way he/she can make sure your hyperglycemia does not continue to get worse or progress to diabetes. Steroids  Steroids are medications that act on the infection fighting system (immune system) to block inflammation or infection. One side effect can be a rise in blood glucose. Most people can produce enough extra insulin to allow for this rise, but for those who cannot, steroids make blood glucose levels go even higher. It is not unusual for steroid treatments to "uncover" diabetes that is developing. It is not always possible to determine if the hyperglycemia will go away after the steroids are stopped. A special blood test called an A1c is sometimes done to determine if your blood glucose was elevated before the steroids were started. SYMPTOMS  Thirsty.  Frequent urination.  Dry mouth.  Blurred vision.  Tired or fatigue.  Weakness.  Sleepy.  Tingling in feet or leg. DIAGNOSIS  Diagnosis is made by monitoring blood glucose in one or all of the following ways:  A1c test. This is a chemical found in your blood.  Fingerstick blood glucose monitoring.  Laboratory results. TREATMENT  First, knowing the cause of the hyperglycemia is important before the hyperglycemia can be treated. Treatment may include, but is not be limited to:  Education.  Change or adjustment in medications.  Change or adjustment in meal plan.  Treatment for an illness, infection, etc.  More  frequent blood glucose monitoring.  Change in exercise plan.  Decreasing or stopping steroids.  Lifestyle changes. HOME CARE INSTRUCTIONS   Test your blood glucose as directed.  Exercise regularly. Your caregiver will give you instructions about exercise. Pre-diabetes or diabetes which comes on with stress is helped by exercising.  Eat  wholesome, balanced meals. Eat often and at regular, fixed times. Your caregiver or nutritionist will give you a meal plan to guide your sugar intake.  Being at an ideal weight is important. If needed, losing as little as 10 to 15 pounds may help improve blood glucose levels. SEEK MEDICAL CARE IF:   You have questions about medicine, activity, or diet.  You continue to have symptoms (problems such as increased thirst, urination, or weight gain). SEEK IMMEDIATE MEDICAL CARE IF:   You are vomiting or have diarrhea.  Your breath smells fruity.  You are breathing faster or slower.  You are very sleepy or incoherent.  You have numbness, tingling, or pain in your feet or hands.  You have chest pain.  Your symptoms get worse even though you have been following your caregiver's orders.  If you have any other questions or concerns. Document Released: 07/09/2000 Document Revised: 04/07/2011 Document Reviewed: 05/12/2011 [facility] Patient Information 2015 [place], [place]. This information is not intended to replace advice given to you by your health care provider. Make sure you discuss any questions you have with your health care provider.

## 2014-03-08 ENCOUNTER — Other Ambulatory Visit: Payer: Self-pay | Admitting: Emergency Medicine

## 2014-03-09 ENCOUNTER — Other Ambulatory Visit: Payer: Self-pay

## 2014-03-09 DIAGNOSIS — I10 Essential (primary) hypertension: Secondary | ICD-10-CM

## 2014-03-09 MED ORDER — LISINOPRIL 10 MG PO TABS: 10.0000 mg | ORAL_TABLET | Freq: Every day | ORAL | Status: DC

## 2014-03-09 MED ORDER — METOPROLOL SUCCINATE ER 50 MG PO TB24: ORAL_TABLET | ORAL | Status: DC

## 2014-04-17 ENCOUNTER — Ambulatory Visit (INDEPENDENT_AMBULATORY_CARE_PROVIDER_SITE_OTHER): Payer: BLUE CROSS/BLUE SHIELD | Admitting: Family Medicine

## 2014-04-17 ENCOUNTER — Telehealth: Payer: Self-pay | Admitting: *Deleted

## 2014-04-17 ENCOUNTER — Encounter: Payer: Self-pay | Admitting: Family Medicine

## 2014-04-17 VITALS — BP 134/79 | HR 78 | Temp 98.1°F | Resp 16 | Ht 65.5 in | Wt 165.6 lb

## 2014-04-17 DIAGNOSIS — Z0000 Encounter for general adult medical examination without abnormal findings: Secondary | ICD-10-CM

## 2014-04-17 DIAGNOSIS — E785 Hyperlipidemia, unspecified: Secondary | ICD-10-CM

## 2014-04-17 DIAGNOSIS — I10 Essential (primary) hypertension: Secondary | ICD-10-CM

## 2014-04-17 DIAGNOSIS — Z86010 Personal history of colonic polyps: Secondary | ICD-10-CM

## 2014-04-17 LAB — POCT URINALYSIS DIPSTICK
BILIRUBIN UA: NEGATIVE
Blood, UA: NEGATIVE
GLUCOSE UA: NEGATIVE
Ketones, UA: NEGATIVE
Leukocytes, UA: NEGATIVE
NITRITE UA: NEGATIVE
PH UA: 5.5
Protein, UA: NEGATIVE
Spec Grav, UA: 1.01
Urobilinogen, UA: 0.2

## 2014-04-17 NOTE — Progress Notes (Signed)
   Subjective:    Patient ID: Samuel Macias, male    DOB: 1955/03/04, 59 y.o.   MRN: [id number]  HPI    Review of Systems  Constitutional: Negative.   HENT: Negative.   Eyes: Negative.   Respiratory: Negative.   Cardiovascular: Negative.   Gastrointestinal: Negative.   Endocrine: Negative.   Genitourinary: Negative.   Musculoskeletal: Negative.   Skin: Negative.   Allergic/Immunologic: Negative.   Neurological: Negative.   Hematological: Negative.   Psychiatric/Behavioral: Negative.        Objective:   Physical Exam        Assessment & Plan:

## 2014-04-17 NOTE — Progress Notes (Signed)
Subjective:  This chart was scribed for Samuel Ray, MD by Samuel Macias, ED Scribe at [facility].The patient was seen in exam room ** and the patient's care was started at 4:52 PM.   Patient ID: Samuel Macias, male    DOB: Oct 05, 1955, 59 y.o.   MRN: [id number] Chief Complaint  Patient presents with  . Annual Exam   HPI HPI Comments: Samuel Macias is a 59 y.o. male who presents to [facility] for an annual exam.  Cancer screening: His last colonoscopy was 08/03/2013 and  planned for repeat in 2022.  Prostate cancer screening: PSA normal 02/12/2013  Immunizations: Immunization History  Administered Date(s) Administered  . Tdap 09/30/2006   Depression screening: PHQ 2 screening is zero  Fall screening: Zero falls in the past year.  Functional status screening: was normal no positive findings.  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 He decided to take same dose of Crestor but to watch diet and recheck labs in 6 months. Lab Results  Component Value Date   CHOL 221* 03/07/2014   HDL 62 03/07/2014   LDLCALC 138* 03/07/2014   TRIG 106 03/07/2014   CHOLHDL 3.6 03/07/2014   Hypertension: Kidney function normal in 03/07/2014.  Hyperglycemia: Noted to have slightly elevated levels in the past few years. Pre diabetic per A1C of 5.9 last month.   Patient Active Problem List   Diagnosis Date Noted  . Hypertension 03/01/2011  . Hyperlipidemia 03/01/2011  . Personal history of colonic polyps - adenomas 11/05/2006   Past Medical History  Diagnosis Date  . Hypertension   . Hyperlipidemia   . Personal history of colonic polyps - adenomas 11/05/2006   Past Surgical History  Procedure Laterality Date  . Pain block      Herniated disc repair  . Wrist surgery      tendon  . External ear surgery      as child    No Known Allergies Prior to Admission medications   Medication Sig Start Date End Date Taking? Authorizing Provider  aspirin 81 MG tablet Take 81 mg by mouth daily.   Yes Historical Provider, MD  lisinopril (PRINIVIL,ZESTRIL) 10 MG tablet Take 1 tablet (10 mg total) by mouth daily. 03/09/14  Yes Wendie Agreste, MD  metoprolol succinate (TOPROL-XL) 50 MG 24 hr tablet TAKE 1 TABLET BY MOUTH EVERY DAY 03/09/14  Yes Wendie Agreste, MD  Omega-3 Fatty Acids (FISH OIL) 1200 MG CAPS Take 1 capsule by mouth daily.   Yes Historical Provider, MD  rosuvastatin (CRESTOR) 20 MG tablet Take 1 tablet (20 mg total) by mouth daily. 03/07/14  Yes Wendie Agreste, MD   History   Social History  . Marital Status: Married    Spouse Name: [place]  . Number of Children: 2  . Years of Education: 12th grade   Occupational History  . retired Soil scientist    Social History Main Topics  . Smoking status: Never Smoker   . Smokeless tobacco: Never Used  . Alcohol Use: 6.0 oz/week    10 Cans of beer per week  . Drug Use: No  . Sexual Activity: Not on file   Other Topics Concern  . Not on file   Social History Narrative   Lives with his wife and son, [name]. His daughter  lives independently in [place], [place].   Review of Systems 13 point ROS reviewed in patient survey, negative other than listed above or in reviewed nursing note.     Objective:  BP 134/79 mmHg  Pulse 78  Temp(Src) 98.1 F (36.7 C) (Oral)  Resp 16  Ht 5' 5.5" (1.664 m)  Wt 165 lb 9.6 oz (75.116 kg)  BMI 27.13 kg/m2  SpO2 97%   Visual Acuity Screening   Right eye Left eye Both eyes  Without correction:  20/70 20/20  With correction: 20/20     Physical Exam  Constitutional: He is oriented to person, place, and time. He appears well-developed and well-nourished. No distress.  HENT:  Head: Normocephalic and atraumatic.  Eyes: Pupils are equal, round, and reactive to light.  Neck: Normal range of motion.   Cardiovascular: Normal rate and regular rhythm.   Pulmonary/Chest: Effort normal. No respiratory distress.  Musculoskeletal: Normal range of motion.  Neurological: He is alert and oriented to person, place, and time.  Skin: Skin is warm and dry.  Psychiatric: He has a normal mood and affect. His behavior is normal.  Nursing note and vitals reviewed.      Assessment & Plan:   Patient discussed  And examined separately. Agree with assessment and plan of care per his note.

## 2014-04-17 NOTE — Patient Instructions (Signed)
Keeping you healthy  Get these tests  Blood pressure- Have your blood pressure checked once a year by your healthcare provider.  Normal blood pressure is 120/80  Weight- Have your body mass index (BMI) calculated to screen for obesity.  BMI is a measure of body fat based on height and weight. You can also calculate your own BMI at ViewBanking.si.  Cholesterol- Have your cholesterol checked every year.  Diabetes- Have your blood sugar checked regularly if you have high blood pressure, high cholesterol, have a family history of diabetes or if you are overweight.  Screening for Colon Cancer- Colonoscopy starting at age 57.  Screening may begin sooner depending on your family history and other health conditions. Follow up colonoscopy as directed by your Gastroenterologist.  Screening for Prostate Cancer- Both blood work (PSA) and a rectal exam help screen for Prostate Cancer.  Screening begins at age 42 with African-American men and at age 83 with Caucasian men.  Screening may begin sooner depending on your family history.  Take these medicines  Aspirin- One aspirin daily can help prevent Heart disease and Stroke.  Flu shot- Every fall.  Tetanus- Every 10 years.  Zostavax- Once after the age of 59 to prevent Shingles.  Pneumonia shot- Once after the age of 7; if you are younger than 43, ask your healthcare provider if you need a Pneumonia shot.  Take these steps  Don't smoke- If you do smoke, talk to your doctor about quitting.  For tips on how to quit, go to www.smokefree.gov or call 1-800-QUIT-NOW.  Be physically active- Exercise 5 days a week for at least 30 minutes.  If you are not already physically active start slow and gradually work up to 30 minutes of moderate physical activity.  Examples of moderate activity include walking briskly, mowing the yard, dancing, swimming, bicycling, etc.  Eat a healthy diet- Eat a variety of healthy food such as fruits, vegetables, low  fat milk, low fat cheese, yogurt, lean meant, poultry, fish, beans, tofu, etc. For more information go to www.thenutritionsource.org  Drink alcohol in moderation- Limit alcohol intake to less than two drinks a day. Never drink and drive.  Dentist- Brush and floss twice daily; visit your dentist twice a year.  Depression- Your emotional health is as important as your physical health. If you're feeling down, or losing interest in things you would normally enjoy please talk to your healthcare provider.  Eye exam- Visit your eye doctor every year.  Safe sex- If you may be exposed to a sexually transmitted infection, use a condom.  Seat belts- Seat belts can save your life; always wear one.  Smoke/Carbon Monoxide detectors- These detectors need to be installed on the appropriate level of your home.  Replace batteries at least once a year.  Skin cancer- When out in the sun, cover up and use sunscreen 15 SPF or higher.  Violence- If anyone is threatening you, please tell your healthcare provider.  Living Will/ Health care power of attorney- Speak with your healthcare provider and family.  Hypertension Hypertension, commonly called high blood pressure, is when the force of blood pumping through your arteries is too strong. Your arteries are the blood vessels that carry blood from your heart throughout your body. A blood pressure reading consists of a higher number over a lower number, such as 110/72. The higher number (systolic) is the pressure inside your arteries when your heart pumps. The lower number (diastolic) is the pressure inside your arteries when your heart  relaxes. Ideally you want your blood pressure below 120/80. Hypertension forces your heart to work harder to pump blood. Your arteries may become narrow or stiff. Having hypertension puts you at risk for heart disease, stroke, and other problems.  RISK FACTORS Some risk factors for high blood pressure are controllable. Others are not.   Risk factors you cannot control include:  7. Race. You may be at higher risk if you are African American. 8. Age. Risk increases with age. 9. Gender. Men are at higher risk than women before age 57 years. After age 72, women are at higher risk than men. Risk factors you can control include: 6. Not getting enough exercise or physical activity. 64. Being overweight. 8. Getting too much fat, sugar, calories, or salt in your diet. 9. Drinking too much alcohol. SIGNS AND SYMPTOMS Hypertension does not usually cause signs or symptoms. Extremely high blood pressure (hypertensive crisis) may cause headache, anxiety, shortness of breath, and nosebleed. DIAGNOSIS  To check if you have hypertension, your health care provider will measure your blood pressure while you are seated, with your arm held at the level of your heart. It should be measured at least twice using the same arm. Certain conditions can cause a difference in blood pressure between your right and left arms. A blood pressure reading that is higher than normal on one occasion does not mean that you need treatment. If one blood pressure reading is high, ask your health care provider about having it checked again. TREATMENT  Treating high blood pressure includes making lifestyle changes and possibly taking medicine. Living a healthy lifestyle can help lower high blood pressure. You may need to change some of your habits. Lifestyle changes may include: 14. Following the DASH diet. This diet is high in fruits, vegetables, and whole grains. It is low in salt, red meat, and added sugars. 15. Getting at least 2 hours of brisk physical activity every week. 61. Losing weight if necessary. 17. Not smoking. 18. Limiting alcoholic beverages. 19. Learning ways to reduce stress. If lifestyle changes are not enough to get your blood pressure under control, your health care provider may prescribe medicine. You may need to take more than one. Work closely  with your health care provider to understand the risks and benefits. HOME CARE INSTRUCTIONS  Have your blood pressure rechecked as directed by your health care provider.   Take medicines only as directed by your health care provider. Follow the directions carefully. Blood pressure medicines must be taken as prescribed. The medicine does not work as well when you skip doses. Skipping doses also puts you at risk for problems.   Do not smoke.   Monitor your blood pressure at home as directed by your health care provider. SEEK MEDICAL CARE IF:   You think you are having a reaction to medicines taken.  You have recurrent headaches or feel dizzy.  You have swelling in your ankles.  You have trouble with your vision. SEEK IMMEDIATE MEDICAL CARE IF:  You develop a severe headache or confusion.  You have unusual weakness, numbness, or feel faint.  You have severe chest or abdominal pain.  You vomit repeatedly.  You have trouble breathing. MAKE SURE YOU:   Understand these instructions.  Will watch your condition.  Will get help right away if you are not doing well or get worse. Document Released: 01/13/2005 Document Revised: 05/30/2013 Document Reviewed: 11/05/2012 [facility] Patient Information 2015 [place], [place]. This information is not intended to replace advice  to you by your health care provider. Make sure you discuss any questions you have with your health care provider.  

## 2014-04-17 NOTE — Telephone Encounter (Signed)
Called patient to give date/time for appointment in 6 months with Dr Carlota Raspberry to follow up diabetes/cholesterol. He was not able to get the appointment before leaving.

## 2014-04-17 NOTE — Progress Notes (Signed)
MRN: [id number]  Subjective:   [name] is a 59 y.o. male presenting for annual physical exam.  Medical care team includes:  PCP: Wendie Agreste, MD Vision: Dr. Jacqualine Mau, eye exam scheduled 05/2014 Dental: Dr. Priscille Heidelberg, had cleaning 03/2014, follow up scheduled 04/27/2014 for evaluation of chipped tooth Specialists: Colonoscopy completed 07/2013, had a few polyps removed, is on 5 year follow up.  [name] has Hypertension; Hyperlipidemia; and Personal history of colonic polyps - adenomas on his problem list.  HTN - managed with lisinopril, metoprolol. Checking BP at home, occasional 160's/90's but generally 284'X systolic. Denies headaches, double vision, chest pain, heart racing, shob, dizziness, lower leg swelling, urinary changes. Patient started exercising more recently, uses elliptical machine regularly, trying to eat healthier but does not actively avoid salt, eats TV dinners. Drinks 12 pack of beer throughout 1 week, does not smoke cigarettes.   Hyperlipidemia - managed with rosuvastatin, fish oil, diet and exercise as above. Denies mental fog, myalgia.  Hx of polyps - GI work-up as above. Diet as above. Denies family history of colon cancer. Denies constipation, blood in stool, weight loss, abdominal bloating.  Social - lives with wife, 1 son (DOB 71) lives at home, 1 other adult child, reports that everyone is doing well. 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  [name] has a current medication list which includes the following prescription(s): aspirin, lisinopril, metoprolol succinate, fish oil, and rosuvastatin.  He has No Known Allergies  Samuel Macias  has a past medical history of Hypertension; Hyperlipidemia; and Personal history of colonic polyps - adenomas (11/05/2006). Also  has past surgical history that includes pain block; Wrist surgery; and External ear surgery.  Immunizations: No flu vaccine this  year, last TDAP 09/30/2006  ROS    Objective:   Vitals: BP 134/79 mmHg  Pulse 78  Temp(Src) 98.1 F (36.7 C) (Oral)  Resp 16  Ht 5' 5.5" (1.664 m)  Wt 165 lb 9.6 oz (75.116 kg)  BMI 27.13 kg/m2  SpO2 97%  Wt Readings from Last 3 Encounters:  04/17/14 165 lb 9.6 oz (75.116 kg)  03/07/14 162 lb (73.483 kg)  02/20/14 168 lb (76.204 kg)   BP Readings from Last 3 Encounters:  04/17/14 134/79  03/07/14 118/76  02/20/14 138/82   Physical Exam  Constitutional: He is oriented to person, place, and time and well-developed, well-nourished, and in no distress.  HENT:  Left TM intact bilaterally, no effusions or erythema. 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 Nares patent, nasal turbinates pink and moist. Oropharynx clear, mucous membranes moist, dentition in good repair.  Eyes: Conjunctivae and EOM are normal. Pupils are equal, round, and reactive to light. Right eye exhibits no discharge. Left eye exhibits no discharge. No scleral icterus.  Neck: Normal range of motion. Neck supple. No thyromegaly present.  Cardiovascular: Normal rate, regular rhythm and intact distal pulses.  Exam reveals no gallop and no friction rub.   No murmur heard. Pulmonary/Chest: No stridor. No respiratory distress. He has no wheezes. He has no rales. He exhibits no tenderness.  Abdominal: Soft. Bowel sounds are normal. He exhibits no distension and no mass. There is no tenderness.  Genitourinary:  Deferred.  Musculoskeletal: Normal range of motion. He exhibits no edema or tenderness.  Grip strength 5/5.  Lymphadenopathy:    He has no cervical adenopathy.  Neurological: He is alert and oriented to person, place, and time. He has normal reflexes.  Skin: Skin is warm and dry. No rash noted. No erythema. No pallor.  Psychiatric: Mood and affect normal.   Results for orders placed or performed in visit on 04/17/14 (from the past 24 hour(s))  POCT urinalysis dipstick     Status: None   Collection  Time: 04/17/14  5:10 PM  Result Value Ref Range   Color, UA YELLOW    Clarity, UA CLEAR    Glucose, UA NEG    Bilirubin, UA NEG    Ketones, UA NEG    Spec Grav, UA 1.010    Blood, UA NEG    pH, UA 5.5    Protein, UA NEG    Urobilinogen, UA 0.2    Nitrite, UA NEG    Leukocytes, UA Negative    Assessment and Plan :   1. Annual physical exam - Medically healthy, labs pending, discussed healthy lifestyle, diet, exercise, preventative care, vaccinations, and addressed patient's concerns.  - Repeat annual exam in 1 year.  2. Hyperlipidemia - Labs pending, continue rosuvastatin, practicing healthy diet and exercise - Follow up in 6 months  3. Essential hypertension - Well-controlled, continue lisinopril, metoprolol, diet and exercise as above - Labs pending - Follow up in 6 months  4. History of colonic polyps - Stable, continue follow up with GI specialist  Jaynee Eagles, PA-C [facility] [phone] 04/17/2014 4:19 PM

## 2014-04-18 ENCOUNTER — Encounter: Payer: Self-pay | Admitting: Urgent Care

## 2014-04-18 LAB — COMPREHENSIVE METABOLIC PANEL
ALBUMIN: 4.4 g/dL (ref 3.5–5.2)
ALT: 32 U/L (ref 0–53)
AST: 27 U/L (ref 0–37)
Alkaline Phosphatase: 40 U/L (ref 39–117)
BUN: 19 mg/dL (ref 6–23)
CHLORIDE: 99 meq/L (ref 96–112)
CO2: 27 mEq/L (ref 19–32)
Calcium: 9.4 mg/dL (ref 8.4–10.5)
Creat: 0.76 mg/dL (ref 0.50–1.35)
Glucose, Bld: 94 mg/dL (ref 70–99)
POTASSIUM: 4.4 meq/L (ref 3.5–5.3)
SODIUM: 135 meq/L (ref 135–145)
Total Bilirubin: 0.4 mg/dL (ref 0.2–1.2)
Total Protein: 7.1 g/dL (ref 6.0–8.3)

## 2014-04-18 LAB — CBC WITH DIFFERENTIAL/PLATELET
Basophils Absolute: 0.1 10*3/uL (ref 0.0–0.1)
Basophils Relative: 1 % (ref 0–1)
EOS PCT: 2 % (ref 0–5)
Eosinophils Absolute: 0.1 10*3/uL (ref 0.0–0.7)
HEMATOCRIT: 42.3 % (ref 39.0–52.0)
HEMOGLOBIN: 14.3 g/dL (ref 13.0–17.0)
LYMPHS ABS: 2.5 10*3/uL (ref 0.7–4.0)
Lymphocytes Relative: 34 % (ref 12–46)
MCH: 31.1 pg (ref 26.0–34.0)
MCHC: 33.8 g/dL (ref 30.0–36.0)
MCV: 92 fL (ref 78.0–100.0)
MONOS PCT: 15 % — AB (ref 3–12)
MPV: 10.4 fL (ref 8.6–12.4)
Monocytes Absolute: 1.1 10*3/uL — ABNORMAL HIGH (ref 0.1–1.0)
Neutro Abs: 3.5 10*3/uL (ref 1.7–7.7)
Neutrophils Relative %: 48 % (ref 43–77)
Platelets: 284 10*3/uL (ref 150–400)
RBC: 4.6 MIL/uL (ref 4.22–5.81)
RDW: 14.1 % (ref 11.5–15.5)
WBC: 7.3 10*3/uL (ref 4.0–10.5)

## 2014-06-01 ENCOUNTER — Ambulatory Visit (INDEPENDENT_AMBULATORY_CARE_PROVIDER_SITE_OTHER): Payer: BLUE CROSS/BLUE SHIELD

## 2014-06-01 ENCOUNTER — Ambulatory Visit (INDEPENDENT_AMBULATORY_CARE_PROVIDER_SITE_OTHER): Payer: BLUE CROSS/BLUE SHIELD | Admitting: Physician Assistant

## 2014-06-01 VITALS — BP 128/70 | HR 80 | Temp 98.5°F | Resp 20 | Ht 65.5 in | Wt 168.4 lb

## 2014-06-01 DIAGNOSIS — R053 Chronic cough: Secondary | ICD-10-CM

## 2014-06-01 DIAGNOSIS — J189 Pneumonia, unspecified organism: Secondary | ICD-10-CM | POA: Diagnosis not present

## 2014-06-01 DIAGNOSIS — R05 Cough: Secondary | ICD-10-CM

## 2014-06-01 MED ORDER — AZITHROMYCIN 250 MG PO TABS: ORAL_TABLET | ORAL | Status: DC

## 2014-06-01 MED ORDER — PREDNISONE 20 MG PO TABS
40.0000 mg | ORAL_TABLET | Freq: Every day | ORAL | Status: AC
Start: 2014-06-01 — End: 2014-06-06

## 2014-06-01 NOTE — Progress Notes (Signed)
06/01/2014 at 9:48 AM  Samuel Macias / DOB: [birth year] / MRN: [id number]  The patient has Hypertension; Hyperlipidemia; and Personal history of colonic polyps - adenomas on his problem list.  SUBJECTIVE  Chief complaint: chest congestion and Cough   Samuel Macias is a 59 y.o. male complaining of chest congestion and productive cough that started 3 months ago after being treated for a bacterial sinusitis with Amox-Clav for ten days.  Associated symptoms include nothing today, and he denies runny nose, congestion, fever, sore throat, difficulty breathing and headache.The patient symptoms show no change. Treatments tried thus far include nothing.He denies sick contacts. He takes lisinopril and denies gerd like symptoms as well as allergic symptoms. He denies leg swelling and abdominal swelling, as well as orthopnea.   He  has a past medical history of Hypertension; Hyperlipidemia; and Personal history of colonic polyps - adenomas (11/05/2006).    Medications reviewed and updated by myself where necessary, and exist elsewhere in the encounter.   Samuel Macias has No Known Allergies. He  reports that he has never smoked. He has never used smokeless tobacco. He reports that he drinks about 6.0 oz of alcohol per week. He reports that he does not use illicit drugs. He  has no sexual activity history on file. The patient  has past surgical history that includes pain block; Wrist surgery; and External ear surgery.  His family history includes Crohn's disease in his brother. There is no history of Colon cancer.  Review of Systems  Constitutional: Negative for fever, chills, weight loss, malaise/fatigue and diaphoresis.  Respiratory: Negative for shortness of breath and wheezing.   Cardiovascular: Negative for chest pain.  Gastrointestinal: Negative for heartburn, nausea and abdominal pain.  Genitourinary: Negative.   Skin: Negative.   Neurological: Negative for dizziness and weakness.     OBJECTIVE  His  height is 5' 5.5" (1.664 m) and weight is 168 lb 6.4 oz (76.386 kg). His oral temperature is 98.5 F (36.9 C). His blood pressure is 128/70 and his pulse is 80. His respiration is 20 and oxygen saturation is 94%.  The patient's body mass index is 27.59 kg/(m^2).    Physical Exam  Constitutional: He is oriented to person, place, and time. He appears well-developed and well-nourished. No distress.  Cardiovascular: Normal rate and regular rhythm.   Respiratory: Effort normal. No respiratory distress. He has no wheezes. He has rales (left lower lung fields). He exhibits no tenderness.  GI: Soft. Bowel sounds are normal.  Neurological: He is alert and oriented to person, place, and time.  Skin: Skin is warm and dry. He is not diaphoretic.  Psychiatric: He has a normal mood and affect.    No results found for this or any previous visit (from the past 24 hour(s)).  [facility] reading (PRIMARY) by  Dr.Lauenstein: Bronchial thickening questionable for pneumonia.   ASSESSMENT & PLAN  Samuel Macias was seen today for chest congestion and cough.  Diagnoses and all orders for this visit:  Chronic coughing Orders: -     DG Chest 2 View; Future -     predniSONE (DELTASONE) 20 MG tablet; Take 2 tablets (40 mg total) by mouth daily with breakfast.  Atypical pneumonia: Given rales, questionable chest radiograph and patient HPI atypical pneumonia is reasonable to cover for.  He is to come back in 6 days for reeval.   Orders: -     azithromycin (ZITHROMAX Z-PAK) 250 MG tablet; Take two on the first day, and then  on pill each daily thereafter.  Take all of the medication.    The patient was advised to call or come back to clinic if he does not see an improvement in symptoms, or worsens with the above plan.   Philis Fendt, MHS, PA-C [facility] 06/01/2014 9:48 AM

## 2014-06-09 ENCOUNTER — Ambulatory Visit (INDEPENDENT_AMBULATORY_CARE_PROVIDER_SITE_OTHER): Payer: BLUE CROSS/BLUE SHIELD | Admitting: Physician Assistant

## 2014-06-09 VITALS — BP 128/82 | HR 74 | Temp 98.1°F | Resp 17 | Ht 65.5 in | Wt 167.0 lb

## 2014-06-09 DIAGNOSIS — R05 Cough: Secondary | ICD-10-CM | POA: Diagnosis not present

## 2014-06-09 DIAGNOSIS — N522 Drug-induced erectile dysfunction: Secondary | ICD-10-CM | POA: Diagnosis not present

## 2014-06-09 DIAGNOSIS — R0989 Other specified symptoms and signs involving the circulatory and respiratory systems: Secondary | ICD-10-CM

## 2014-06-09 DIAGNOSIS — T464X5A Adverse effect of angiotensin-converting-enzyme inhibitors, initial encounter: Secondary | ICD-10-CM

## 2014-06-09 MED ORDER — ALBUTEROL SULFATE (2.5 MG/3ML) 0.083% IN NEBU: 2.5000 mg | INHALATION_SOLUTION | Freq: Once | RESPIRATORY_TRACT | Status: DC

## 2014-06-09 MED ORDER — IPRATROPIUM BROMIDE 0.02 % IN SOLN: 0.5000 mg | Freq: Once | RESPIRATORY_TRACT | Status: DC

## 2014-06-09 MED ORDER — LOSARTAN POTASSIUM 50 MG PO TABS: 50.0000 mg | ORAL_TABLET | Freq: Every day | ORAL | Status: DC

## 2014-06-09 MED ORDER — SILDENAFIL CITRATE 100 MG PO TABS: 50.0000 mg | ORAL_TABLET | Freq: Every day | ORAL | Status: DC | PRN

## 2014-06-09 NOTE — Patient Instructions (Signed)
CLINICAL DATA: Cough and congestion for 4 months  EXAM: CHEST 2 VIEW  COMPARISON: None.  FINDINGS: The heart size and mediastinal contours are within normal limits. Both lungs are hyperinflated. The visualized skeletal structures are unremarkable.  IMPRESSION: COPD without acute abnormality.

## 2014-06-09 NOTE — Progress Notes (Signed)
06/09/2014 at 10:04 AM  Samuel Macias / DOB: Jun 16, 1955 / MRN: [id number]  The patient has Hypertension; Hyperlipidemia; and Personal history of colonic polyps - adenomas on his problem list.  SUBJECTIVE  Chief complaint: Follow up   Patient here for follow up of chronic cough.  Reports his last round of treatment from 06/01/14, dose pack and azithro was helpful, however still complains of cough.  Informed patient of abnormal radiograph that included hyperinflation consistent with COPD and advised further workup. Patient agreeable. He continues to complain of cough, however improved, and denies SOB, DOE, and weight loss.  He reports a four year smoking history from the age of 60 to 63, but was raised around secondhand smoke.  He reports some occupational exposure to deasel  exhaust while working for [employer] for roughly thirty years.  He denies a history of asthma.   Patient reports some erectile dysfunction that started with an increase in Metoprolol in January of 2015.  Reports he has a difficult time since with achieving and maintaining an erection with both sex and masturbation. He denies orthostatic presyncope.  He does not take nitrates.    He  has a past medical history of Hypertension; Hyperlipidemia; and Personal history of colonic polyps - adenomas (11/05/2006).    Medications reviewed and updated by myself where necessary, and exist elsewhere in the encounter.   Samuel Macias has No Known Allergies. He  reports that he has never smoked. He has never used smokeless tobacco. He reports that he drinks about 6.0 oz of alcohol per week. He reports that he does not use illicit drugs. He  has no sexual activity history on file. The patient  has past surgical history that includes pain block; Wrist surgery; and External ear surgery.  His family history includes Crohn's disease in his brother. There is no history of Colon cancer.  Review of Systems  Constitutional: Negative for fever and chills.    Respiratory: Positive for cough. Negative for hemoptysis, shortness of breath and wheezing.   Cardiovascular: Negative for chest pain.  Gastrointestinal: Negative for nausea and abdominal pain.  Musculoskeletal: Negative for neck pain.  Skin: Negative for itching and rash.  Neurological: Negative for dizziness and headaches.    OBJECTIVE  His  height is 5' 5.5" (1.664 m) and weight is 167 lb (75.751 kg). His oral temperature is 98.1 F (36.7 C). His blood pressure is 128/82 and his pulse is 74. His respiration is 17 and oxygen saturation is 97%.  The patient's body mass index is 27.36 kg/(m^2).  Physical Exam  Vitals reviewed. Constitutional: He is oriented to person, place, and time. He appears well-developed and well-nourished. No distress.  HENT:  Right Ear: Hearing, tympanic membrane, external ear and ear canal normal.  Left Ear: Hearing, tympanic membrane, external ear and ear canal normal.  Nose: Mucosal edema present. No sinus tenderness. Right sinus exhibits no maxillary sinus tenderness and no frontal sinus tenderness. Left sinus exhibits no maxillary sinus tenderness and no frontal sinus tenderness.  Mouth/Throat: Uvula is midline and mucous membranes are normal. Mucous membranes are not pale, not dry and not cyanotic. No oropharyngeal exudate, posterior oropharyngeal edema, posterior oropharyngeal erythema or tonsillar abscesses.  Cardiovascular: Normal rate and regular rhythm.   Respiratory: Effort normal and breath sounds normal. He has no wheezes. He has no rales.  Musculoskeletal: Normal range of motion.  Lymphadenopathy:    He has no cervical adenopathy.  Neurological: He is alert and oriented to person, place,  and time.  Skin: Skin is warm and dry. He is not diaphoretic.  Psychiatric: He has a normal mood and affect.    No results found for this or any previous visit (from the past 24 hour(s)).  ASSESSMENT & PLAN  Samuel Macias was seen today for  follow-up.  Diagnoses and all orders for this visit:  Hyperinflation of lungs: EKG and spirometry normal.  Unkown etiology at this point. Will treat chronic cough with empiric trials. Will start with problem 2, and if this fails will consider trial of ranitidine or zyrtec, one at a time.  Orders: -     EKG 12-Lead -     PFT PULM FXN SPIROMETRY (94010) -     Discontinue: albuterol (PROVENTIL) (2.5 MG/3ML) 0.083% nebulizer solution 2.5 mg; Take 3 mLs (2.5 mg total) by nebulization once. -     Discontinue: ipratropium (ATROVENT) nebulizer solution 0.5 mg; Take 2.5 mLs (0.5 mg total) by nebulization once.  ACE-inhibitor cough Orders: -     losartan (COZAAR) 50 MG tablet; Take 1 tablet (50 mg total) by mouth daily.  Drug-induced erectile dysfunction Orders: -     sildenafil (VIAGRA) 100 MG tablet; Take 0.5-1 tablets (50-100 mg total) by mouth daily as needed for erectile dysfunction.    The patient was advised to call or come back to clinic if he does not see an improvement in symptoms, or worsens with the above plan.   Philis Fendt, MHS, PA-C [facility] 06/09/2014 10:04 AM

## 2014-06-19 ENCOUNTER — Encounter: Payer: Self-pay | Admitting: Family Medicine

## 2014-07-10 ENCOUNTER — Encounter: Payer: BLUE CROSS/BLUE SHIELD | Admitting: Family Medicine

## 2014-08-22 ENCOUNTER — Telehealth: Payer: Self-pay

## 2014-08-22 DIAGNOSIS — E785 Hyperlipidemia, unspecified: Secondary | ICD-10-CM

## 2014-08-22 MED ORDER — ROSUVASTATIN CALCIUM 20 MG PO TABS: 20.0000 mg | ORAL_TABLET | Freq: Every day | ORAL | Status: DC

## 2014-08-22 NOTE — Telephone Encounter (Signed)
Rx sent 

## 2014-08-22 NOTE — Telephone Encounter (Signed)
Pt requesting refill on crestor thur [facility] phone  [phone]

## 2014-10-23 ENCOUNTER — Encounter: Payer: Self-pay | Admitting: Family Medicine

## 2014-10-23 ENCOUNTER — Ambulatory Visit (INDEPENDENT_AMBULATORY_CARE_PROVIDER_SITE_OTHER): Payer: BLUE CROSS/BLUE SHIELD | Admitting: Family Medicine

## 2014-10-23 VITALS — BP 146/94 | HR 73 | Temp 98.5°F | Resp 16 | Wt 178.0 lb

## 2014-10-23 DIAGNOSIS — R7303 Prediabetes: Secondary | ICD-10-CM

## 2014-10-23 DIAGNOSIS — I10 Essential (primary) hypertension: Secondary | ICD-10-CM

## 2014-10-23 DIAGNOSIS — R7309 Other abnormal glucose: Secondary | ICD-10-CM

## 2014-10-23 DIAGNOSIS — E785 Hyperlipidemia, unspecified: Secondary | ICD-10-CM

## 2014-10-23 DIAGNOSIS — Z23 Encounter for immunization: Secondary | ICD-10-CM

## 2014-10-23 DIAGNOSIS — R059 Cough, unspecified: Secondary | ICD-10-CM

## 2014-10-23 DIAGNOSIS — R05 Cough: Secondary | ICD-10-CM

## 2014-10-23 LAB — LIPID PANEL
CHOLESTEROL: 189 mg/dL (ref 125–200)
HDL: 58 mg/dL (ref >=40)
LDL Cholesterol: 101 mg/dL (ref <130)
Total CHOL/HDL Ratio: 3.3 Ratio (ref <=5.0)
Triglycerides: 150 mg/dL — ABNORMAL HIGH (ref <150)
VLDL: 30 mg/dL (ref <30)

## 2014-10-23 LAB — COMPLETE METABOLIC PANEL WITH GFR
ALBUMIN: 4.4 g/dL (ref 3.6–5.1)
ALK PHOS: 42 U/L (ref 40–115)
ALT: 32 U/L (ref 9–46)
AST: 28 U/L (ref 10–35)
BUN: 10 mg/dL (ref 7–25)
CO2: 25 mmol/L (ref 20–31)
Calcium: 9.2 mg/dL (ref 8.6–10.3)
Chloride: 102 mmol/L (ref 98–110)
Creat: 0.71 mg/dL (ref 0.70–1.33)
GFR, Est African American: >89 mL/min (ref >=60)
GFR, Est Non African American: >89 mL/min (ref >=60)
GLUCOSE: 110 mg/dL — AB (ref 65–99)
POTASSIUM: 4.7 mmol/L (ref 3.5–5.3)
Sodium: 136 mmol/L (ref 135–146)
Total Bilirubin: 0.4 mg/dL (ref 0.2–1.2)
Total Protein: 6.8 g/dL (ref 6.1–8.1)

## 2014-10-23 LAB — POCT GLYCOSYLATED HEMOGLOBIN (HGB A1C): Hemoglobin A1C: 5.9

## 2014-10-23 LAB — GLUCOSE, POCT (MANUAL RESULT ENTRY): POC Glucose: 80 mg/dl (ref 70–99)

## 2014-10-23 MED ORDER — LISINOPRIL 10 MG PO TABS: 10.0000 mg | ORAL_TABLET | Freq: Every day | ORAL | Status: DC

## 2014-10-23 NOTE — Progress Notes (Signed)
Subjective:    Patient ID: Samuel Macias, male    DOB: 1955-04-07, 59 y.o.   MRN: [id number] This chart was scribed for Samuel Ray, MD by Zola Button, Medical Scribe. This patient was seen in Room 23 and the patient's care was started at 4:42 PM.    HPI HPI Comments: Samuel Macias is a 59 y.o. male with a history of hypertension, hyperlipidemia and pre-diabetes who presents to the [facility] for a follow-up.  Hypertension: Last visit with me was in March. Well-controlled at that time and was continued on lisinopril and metoprolol. He was seen in May this year with chronic cough since January, possible atypical pneumonia. Then May 13th thought to have ACE inhibitor cough. Did note that his CXR on May 5th showed hyperinflation consistent with COPD without acute abnormality. Was improving on May 13th. Trial of discontinuation of lisinopril, changed to losartan 50 mg qd. He is also on Toprol 50 mg once per day. Patient notes that he still has the cough. He has not noticed any changes to the cough when he changed to losartan from lisinopril. He does have occasional heartburn depending on what he eats, but not every night. Patient denies fever, chills, unexplained weight loss, edema, chest pain and SOB. He also denies problems with allergies. He does have a history of smoking from age [age], about 0.5 ppd. Additionally, both of his parents did smoke. Lab Results  Component Value Date   CREATININE 0.76 04/17/2014    Hyperlipidemia: Planned on re-check cholesterol as elevated in February. Was continued on Crestor at that time. He was on 20 mg Crestor qd and is still taking it. He denies myalgias and any other new side effects. Lab Results  Component Value Date   CHOL 221* 03/07/2014   HDL 62 03/07/2014   LDLCALC 138* 03/07/2014   TRIG 106 03/07/2014   CHOLHDL 3.6 03/07/2014   Lab Results  Component Value Date   ALT 32 04/17/2014   AST 27 04/17/2014   ALKPHOS 40  04/17/2014   BILITOT 0.4 04/17/2014    Pre-Diabetes: He has been doing cardio exercises on a machine for 34 minutes 3-4 days a week. Lab Results  Component Value Date   HGBA1C 5.9 03/07/2014    Wt Readings from Last 3 Encounters:  10/23/14 178 lb (80.74 kg)  06/09/14 167 lb (75.751 kg)  06/01/14 168 lb 6.4 oz (76.386 kg)     Patient Active Problem List   Diagnosis Date Noted  . Hypertension 03/01/2011  . Hyperlipidemia 03/01/2011  . Personal history of colonic polyps - adenomas 11/05/2006   Past Medical History  Diagnosis Date  . Hypertension   . Hyperlipidemia   . Personal history of colonic polyps - adenomas 11/05/2006   Past Surgical History  Procedure Laterality Date  . Pain block      Herniated disc repair  . Wrist surgery      tendon  . External ear surgery      as child   Not on File Prior to Admission medications   Medication Sig Start Date End Date Taking? Authorizing Provider  aspirin 81 MG tablet Take 81 mg by mouth daily.   Yes Historical Provider, MD  losartan (COZAAR) 50 MG tablet Take 1 tablet (50 mg total) by mouth daily. 06/09/14  Yes Tereasa Coop, PA-C  metoprolol succinate (TOPROL-XL) 50 MG 24 hr tablet TAKE 1 TABLET BY MOUTH EVERY DAY 03/09/14  Yes Wendie Agreste, MD  Omega-3 Fatty Acids (FISH OIL) 1200 MG CAPS Take 1 capsule by mouth daily.   Yes Historical Provider, MD  rosuvastatin (CRESTOR) 20 MG tablet Take 1 tablet (20 mg total) by mouth daily. 08/22/14  Yes Tereasa Coop, PA-C  sildenafil (VIAGRA) 100 MG tablet Take 0.5-1 tablets (50-100 mg total) by mouth daily as needed for erectile dysfunction. 06/09/14  Yes Tereasa Coop, PA-C   Social History   Social History  . Marital Status: Married    Spouse Name: Aspers  . Number of Children: 2  . Years of Education: 12th grade   Occupational History  . retired Soil scientist    Social History Main Topics  . Smoking status: Former Smoker -- 0.50 packs/day for 5 years     Types: Cigarettes  . Smokeless tobacco: Never Used  . Alcohol Use: 6.0 oz/week    10 Cans of beer per week  . Drug Use: No  . Sexual Activity: Not on file   Other Topics Concern  . Not on file   Social History Narrative   Lives with his wife and son, Samuel Macias. His daughter lives independently in [place], [place].     Review of Systems  Constitutional: Negative for fever, chills, fatigue and unexpected weight change.  HENT: Negative for congestion and rhinorrhea.   Eyes: Negative for visual disturbance.  Respiratory: Positive for cough. Negative for chest tightness and shortness of breath.   Cardiovascular: Negative for chest pain, palpitations and leg swelling.  Gastrointestinal: Negative for abdominal pain and blood in stool.  Musculoskeletal: Negative for myalgias.  Allergic/Immunologic: Negative for environmental allergies.  Neurological: Negative for dizziness, light-headedness and headaches.       Objective:   Physical Exam  Constitutional: He is oriented to person, place, and time. He appears well-developed and well-nourished.  HENT:  Head: Normocephalic and atraumatic.  Mouth/Throat: Oropharynx is clear and moist.  Turbinates appear normal. Moist oral mucosa.  Eyes: EOM are normal. Pupils are equal, round, and reactive to light.  Neck: No JVD present. Carotid bruit is not present.  Cardiovascular: Normal rate, regular rhythm and normal heart sounds.   No murmur heard. Pulmonary/Chest: Effort normal and breath sounds normal. He has no wheezes. He has no rales.  Musculoskeletal: He exhibits no edema.  No lower extremity edema.  Neurological: He is alert and oriented to person, place, and time.  Skin: Skin is warm and dry.  Psychiatric: He has a normal mood and affect.  Vitals reviewed.  Results for orders placed or performed in visit on 10/23/14  POCT glucose (manual entry)  Result Value Ref Range   POC Glucose 80 70 - 99 mg/dl  POCT glycosylated hemoglobin (Hb A1C)    Result Value Ref Range   Hemoglobin A1C 5.9       Filed Vitals:   10/23/14 1611 10/23/14 1615  BP: 155/95 146/94  Pulse: 73   Temp: 98.5 F (36.9 C)   TempSrc: Oral   Resp: 16   Weight: 178 lb (80.74 kg)        Assessment & Plan:   RENDER MARLEY is a 59 y.o. male Need for prophylactic vaccination and inoculation against influenza - Plan: Flu Vaccine QUAD 36+ mos IM given  Prediabetes - Plan: POCT glucose (manual entry), POCT glycosylated hemoglobin (Hb A1C)  -Stable A1c, but still in prediabetes range. Continue exercise and diet, and recheck A1c in 3-6 months.  Essential hypertension - Plan: COMPLETE METABOLIC PANEL WITH GFR  -Decreased  control. Will change back to lisinopril as no change in cough with switching to ARB. Doubt ACE inhibitor cough. Monitor blood pressure back on usual regimen, then if remains elevated look at change in meds.  Cough  -Possible multifactorial cough. Differential includes laryngeal pharyngeal reflux. Trigger avoidance discussed, start Zantac over-the-counter twice a day.  -Also possible postnasal drip with allergies, so can try Zyrtec in few weeks if Zantac is not helping. If cough persists in one month, return for recheck and probable spirometry as some indication of possible COPD on chest x-Macias.  Hyperlipidemia - Plan: COMPLETE METABOLIC PANEL WITH GFR, Lipid panel  -Labs pending.  Meds ordered this encounter  Medications  . lisinopril (PRINIVIL,ZESTRIL) 10 MG tablet    Sig: Take 1 tablet (10 mg total) by mouth daily.    Dispense:  90 tablet    Refill:  1   Patient Instructions  restart lisinopril (ok to continue losartan until prescription arrives, then stop losartan).  Avoid foods than can cause heartburn as listed below.  Zantac over the counter twice per day, then if cough not improved in 2 weeks - zyrtec once per day.   Follow up in next 4-6 weeks for cough. May need to check lung function (spirometry test) then.  Return to the  clinic or go to the nearest emergency room if any of your symptoms worsen or new symptoms occur.  Cough, Adult  A cough is a reflex that helps clear your throat and airways. It can help heal the body or may be a reaction to an irritated airway. A cough may only last 2 or 3 weeks (acute) or may last more than 8 weeks (chronic).  CAUSES Acute cough:  Viral or bacterial infections. Chronic cough:  Infections.  Allergies.  Asthma.  Post-nasal drip.  Smoking.  Heartburn or acid reflux.  Some medicines.  Chronic lung problems (COPD).  Cancer. SYMPTOMS   Cough.  Fever.  Chest pain.  Increased breathing rate.  High-pitched whistling sound when breathing (wheezing).  Colored mucus that you cough up (sputum). TREATMENT   A bacterial cough may be treated with antibiotic medicine.  A viral cough must run its course and will not respond to antibiotics.  Your caregiver may recommend other treatments if you have a chronic cough. HOME CARE INSTRUCTIONS   Only take over-the-counter or prescription medicines for pain, discomfort, or fever as directed by your caregiver. Use cough suppressants only as directed by your caregiver.  Use a cold steam vaporizer or humidifier in your bedroom or home to help loosen secretions.  Sleep in a semi-upright position if your cough is worse at night.  Rest as needed.  Stop smoking if you smoke. SEEK IMMEDIATE MEDICAL CARE IF:   You have pus in your sputum.  Your cough starts to worsen.  You cannot control your cough with suppressants and are losing sleep.  You begin coughing up blood.  You have difficulty breathing.  You develop pain which is getting worse or is uncontrolled with medicine.  You have a fever. MAKE SURE YOU:   Understand these instructions.  Will watch your condition.  Will get help right away if you are not doing well or get worse. Document Released: 07/12/2010 Document Revised: 04/07/2011 Document  Reviewed: 07/12/2010 [facility] Patient Information 2015 [place], [place]. This information is not intended to replace advice given to you by your health care provider. Make sure you discuss any questions you have with your health care provider.  Food Choices for Gastroesophageal  Reflux Disease When you have gastroesophageal reflux disease (GERD), the foods you eat and your eating habits are very important. Choosing the right foods can help ease the discomfort of GERD. WHAT GENERAL GUIDELINES DO I NEED TO FOLLOW?  Choose fruits, vegetables, whole grains, low-fat dairy products, and low-fat meat, fish, and poultry.  Limit fats such as oils, salad dressings, butter, nuts, and avocado.  Keep a food diary to identify foods that cause symptoms.  Avoid foods that cause reflux. These may be different for different people.  Eat frequent small meals instead of three large meals each day.  Eat your meals slowly, in a relaxed setting.  Limit fried foods.  Cook foods using methods other than frying.  Avoid drinking alcohol.  Avoid drinking large amounts of liquids with your meals.  Avoid bending over or lying down until 2-3 hours after eating. WHAT FOODS ARE NOT RECOMMENDED? The following are some foods and drinks that may worsen your symptoms: Vegetables Tomatoes. Tomato juice. Tomato and spaghetti sauce. Chili peppers. Onion and garlic. Horseradish. Fruits Oranges, grapefruit, and lemon (fruit and juice). Meats High-fat meats, fish, and poultry. This includes hot dogs, ribs, ham, sausage, salami, and bacon. Dairy Whole milk and chocolate milk. Sour cream. Cream. Butter. Ice cream. Cream cheese.  Beverages Coffee and tea, with or without caffeine. Carbonated beverages or energy drinks. Condiments Hot sauce. Barbecue sauce.  Sweets/Desserts Chocolate and cocoa. Donuts. Peppermint and spearmint. Fats and Oils High-fat foods, including [place] fries and potato chips. Other Vinegar.  Strong spices, such as black pepper, white pepper, red pepper, cayenne, curry powder, cloves, ginger, and chili powder. The items listed above may not be a complete list of foods and beverages to avoid. Contact your dietitian for more information. Document Released: 01/13/2005 Document Revised: 01/18/2013 Document Reviewed: 11/17/2012 [facility] Patient Information 2015 [place], [place]. This information is not intended to replace advice given to you by your health care provider. Make sure you discuss any questions you have with your health care provider.      I personally performed the services described in this documentation, which was scribed in my presence. The recorded information has been reviewed and considered, and addended by me as needed.    By signing my name below, I, Zola Button, attest that this documentation has been prepared under the direction and in the presence of Samuel Ray, MD.  Electronically Signed: Zola Button, Medical Scribe. 10/23/2014. 5:00 PM.

## 2014-10-23 NOTE — Patient Instructions (Signed)
restart lisinopril (ok to continue losartan until prescription arrives, then stop losartan).  Avoid foods than can cause heartburn as listed below.  Zantac over the counter twice per day, then if cough not improved in 2 weeks - zyrtec once per day.   Follow up in next 4-6 weeks for cough. May need to check lung function (spirometry test) then.  Return to the clinic or go to the nearest emergency room if any of your symptoms worsen or new symptoms occur.  Cough, Adult  A cough is a reflex that helps clear your throat and airways. It can help heal the body or may be a reaction to an irritated airway. A cough may only last 2 or 3 weeks (acute) or may last more than 8 weeks (chronic).  CAUSES Acute cough:  Viral or bacterial infections. Chronic cough:  Infections.  Allergies.  Asthma.  Post-nasal drip.  Smoking.  Heartburn or acid reflux.  Some medicines.  Chronic lung problems (COPD).  Cancer. SYMPTOMS   Cough.  Fever.  Chest pain.  Increased breathing rate.  High-pitched whistling sound when breathing (wheezing).  Colored mucus that you cough up (sputum). TREATMENT   A bacterial cough may be treated with antibiotic medicine.  A viral cough must run its course and will not respond to antibiotics.  Your caregiver may recommend other treatments if you have a chronic cough. HOME CARE INSTRUCTIONS   Only take over-the-counter or prescription medicines for pain, discomfort, or fever as directed by your caregiver. Use cough suppressants only as directed by your caregiver.  Use a cold steam vaporizer or humidifier in your bedroom or home to help loosen secretions.  Sleep in a semi-upright position if your cough is worse at night.  Rest as needed.  Stop smoking if you smoke. SEEK IMMEDIATE MEDICAL CARE IF:   You have pus in your sputum.  Your cough starts to worsen.  You cannot control your cough with suppressants and are losing sleep.  You begin coughing  up blood.  You have difficulty breathing.  You develop pain which is getting worse or is uncontrolled with medicine.  You have a fever. MAKE SURE YOU:   Understand these instructions.  Will watch your condition.  Will get help right away if you are not doing well or get worse. Document Released: 07/12/2010 Document Revised: 04/07/2011 Document Reviewed: 07/12/2010 [facility] Patient Information 2015 [place], [place]. This information is not intended to replace advice given to you by your health care provider. Make sure you discuss any questions you have with your health care provider.  Food Choices for Gastroesophageal Reflux Disease When you have gastroesophageal reflux disease (GERD), the foods you eat and your eating habits are very important. Choosing the right foods can help ease the discomfort of GERD. WHAT GENERAL GUIDELINES DO I NEED TO FOLLOW?  Choose fruits, vegetables, whole grains, low-fat dairy products, and low-fat meat, fish, and poultry.  Limit fats such as oils, salad dressings, butter, nuts, and avocado.  Keep a food diary to identify foods that cause symptoms.  Avoid foods that cause reflux. These may be different for different people.  Eat frequent small meals instead of three large meals each day.  Eat your meals slowly, in a relaxed setting.  Limit fried foods.  Cook foods using methods other than frying.  Avoid drinking alcohol.  Avoid drinking large amounts of liquids with your meals.  Avoid bending over or lying down until 2-3 hours after eating. WHAT FOODS ARE NOT RECOMMENDED? The  following are some foods and drinks that may worsen your symptoms: Vegetables Tomatoes. Tomato juice. Tomato and spaghetti sauce. Chili peppers. Onion and garlic. Horseradish. Fruits Oranges, grapefruit, and lemon (fruit and juice). Meats High-fat meats, fish, and poultry. This includes hot dogs, ribs, ham, sausage, salami, and bacon. Dairy Whole milk and chocolate  milk. Sour cream. Cream. Butter. Ice cream. Cream cheese.  Beverages Coffee and tea, with or without caffeine. Carbonated beverages or energy drinks. Condiments Hot sauce. Barbecue sauce.  Sweets/Desserts Chocolate and cocoa. Donuts. Peppermint and spearmint. Fats and Oils High-fat foods, including [place] fries and potato chips. Other Vinegar. Strong spices, such as black pepper, white pepper, red pepper, cayenne, curry powder, cloves, ginger, and chili powder. The items listed above may not be a complete list of foods and beverages to avoid. Contact your dietitian for more information. Document Released: 01/13/2005 Document Revised: 01/18/2013 Document Reviewed: 11/17/2012 [facility] Patient Information 2015 [place], [place]. This information is not intended to replace advice given to you by your health care provider. Make sure you discuss any questions you have with your health care provider.

## 2014-10-26 ENCOUNTER — Encounter: Payer: Self-pay | Admitting: Family Medicine

## 2014-11-20 ENCOUNTER — Encounter: Payer: Self-pay | Admitting: Family Medicine

## 2014-11-20 ENCOUNTER — Ambulatory Visit (INDEPENDENT_AMBULATORY_CARE_PROVIDER_SITE_OTHER): Payer: BLUE CROSS/BLUE SHIELD | Admitting: Family Medicine

## 2014-11-20 VITALS — BP 125/80 | HR 67 | Temp 97.9°F | Resp 16 | Ht 66.0 in | Wt 176.0 lb

## 2014-11-20 DIAGNOSIS — R05 Cough: Secondary | ICD-10-CM

## 2014-11-20 DIAGNOSIS — R059 Cough, unspecified: Secondary | ICD-10-CM

## 2014-11-20 LAB — HEMOGLOBIN A1C: HEMOGLOBIN A1C: 5.9 % (ref 4.0–6.0)

## 2014-11-20 NOTE — Progress Notes (Signed)
Subjective:  This chart was scribed for Samuel Ray, MD by Samuel Macias, ED Scribe. This patient was seen in room 22 and the patient's care was started at 10:32 AM.   Patient ID: Samuel Macias, male    DOB: 03/10/1955, 59 y.o.   MRN: [id number]  HPI Chief Complaint  Patient presents with  . Follow-up  . Cough    HPI Comments: Samuel Macias is a 59 y.o. male who presents to the [facility] for a follow up cough. Last seen 1 month ago. Initially seen in May with chronic cough since January. 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 No change with cessation with ace inhibitor., CXR 5/5 hyperinflation, poss COPD. Short course of smoking hx from age [age] but less than 3 pack years. Second hand smoke from both parents. Did note occasional heart burn at September visit. Possible LPR vs allergies. Recommend Zantac than if not improved trial of zyrtec. Here for recheck.   Since last visit , cough has improved. States he is coughing less and is no longer wheezing. Pt wakes up in the morning having to clear his throat, coughs somewhat during the day and less at night. He has been taking Zantac, did not try zyrtec. No fever, postnasal drip, unexplained weight loss, or coughing up blood. He had spirometry testing that was reported normal in May.     Patient Active Problem List   Diagnosis Date Noted  . Hypertension 03/01/2011  . Hyperlipidemia 03/01/2011  . Personal history of colonic polyps - adenomas 11/05/2006   Past Medical History  Diagnosis Date  . Hypertension   . Hyperlipidemia   . Personal history of colonic polyps - adenomas 11/05/2006   Past Surgical History  Procedure Laterality Date  . Pain block      Herniated disc repair  . Wrist surgery      tendon  . External ear surgery      as child   No Known Allergies Prior to Admission medications   Medication Sig Start Date End Date Taking? Authorizing Provider  aspirin 81 MG tablet Take 81 mg by mouth  daily.   Yes Historical Provider, MD  lisinopril (PRINIVIL,ZESTRIL) 10 MG tablet Take 1 tablet (10 mg total) by mouth daily. 10/23/14  Yes Samuel Agreste, MD  metoprolol succinate (TOPROL-XL) 50 MG 24 hr tablet TAKE 1 TABLET BY MOUTH EVERY DAY 03/09/14  Yes Samuel Agreste, MD  Omega-3 Fatty Acids (FISH OIL) 1200 MG CAPS Take 1 capsule by mouth daily.   Yes Historical Provider, MD  rosuvastatin (CRESTOR) 20 MG tablet Take 1 tablet (20 mg total) by mouth daily. 08/22/14  Yes Samuel Coop, PA-C  sildenafil (VIAGRA) 100 MG tablet Take 0.5-1 tablets (50-100 mg total) by mouth daily as needed for erectile dysfunction. 06/09/14  Yes Samuel Coop, PA-C   Social History   Social History  . Marital Status: Married    Spouse Name: Samuel Macias  . Number of Children: 2  . Years of Education: 12th grade   Occupational History  . retired Soil scientist    Social History Main Topics  . Smoking status: Former Smoker -- 0.50 packs/day for 5 years    Types: Cigarettes  . Smokeless tobacco: Never Used  . Alcohol Use: 6.0 oz/week    10 Cans of beer per week  . Drug Use: No  . Sexual Activity: Not on file   Other Topics Concern  . Not on  file   Social History Narrative   Lives with his wife and son, Samuel Macias. His daughter lives independently in [place], [place].   Review of Systems  Constitutional: Negative for fever, chills and unexpected weight change.  HENT: Negative for postnasal drip.   Respiratory: Positive for cough. Negative for wheezing.   Gastrointestinal: Negative for nausea and vomiting.    Objective:   Physical Exam  Constitutional: He is oriented to person, place, and time. He appears well-developed and well-nourished. No distress.  HENT:  Head: Normocephalic and atraumatic.  Eyes: Conjunctivae and EOM are normal.  Neck: Neck supple.  Cardiovascular: Normal rate, regular rhythm and normal heart sounds.  Exam reveals no gallop and no friction rub.   No murmur  heard. Pulmonary/Chest: Effort normal and breath sounds normal. No respiratory distress. He has no wheezes. He has no rales.  Musculoskeletal: Normal range of motion.  Neurological: He is alert and oriented to person, place, and time.  Skin: Skin is warm and dry.  Psychiatric: He has a normal mood and affect. His behavior is normal.  Nursing note and vitals reviewed.   Filed Vitals:   11/20/14 1021  BP: 125/80  Pulse: 67  Temp: 97.9 F (36.6 C)  Resp: 16  Height: 5\' 6"  (1.676 m)  Weight: 176 lb (79.833 kg)    Assessment & Plan:   Samuel Macias is a 59 y.o. male Cough  -improved on Zantac - possible laryngopharyngeal reflux. Can try prilosec for improved control., add antihistamine for allergic cause, and recheck in 2 months if not resolved. Sooner if worse.   No orders of the defined types were placed in this encounter.   Patient Instructions  As cough is improving - can continue Zantac as heartburn is likely cause. If cough not resolving in next few weeks - change Zantac to Prilosec once per day.  For allergies - you can take over the counter Allegra, Zyrtec, or Claritin once per day. Follow  Up with me in 2-3 months if cough not resolving. Return to the clinic or go to the nearest emergency room if any of your symptoms worsen or new symptoms occur.  Cough, Adult Coughing is a reflex that clears your throat and your airways. Coughing helps to heal and protect your lungs. It is normal to cough occasionally, but a cough that happens with other symptoms or lasts a long time may be a sign of a condition that needs treatment. A cough may last only 2-3 weeks (acute), or it may last longer than 8 weeks (chronic). CAUSES Coughing is commonly caused by:  Breathing in substances that irritate your lungs.  A viral or bacterial respiratory infection.  Allergies.  Asthma.  Postnasal drip.  Smoking.  Acid backing up from the stomach into the esophagus (gastroesophageal  reflux).  Certain medicines.  Chronic lung problems, including COPD (or rarely, lung cancer).  Other medical conditions such as heart failure. HOME CARE INSTRUCTIONS  Pay attention to any changes in your symptoms. Take these actions to help with your discomfort:  Take medicines only as told by your health care provider.  If you were prescribed an antibiotic medicine, take it as told by your health care provider. Do not stop taking the antibiotic even if you start to feel better.  Talk with your health care provider before you take a cough suppressant medicine.  Drink enough fluid to keep your urine clear or pale yellow.  If the air is dry, use a cold steam vaporizer or  humidifier in your bedroom or your home to help loosen secretions.  Avoid anything that causes you to cough at work or at home.  If your cough is worse at night, try sleeping in a semi-upright position.  Avoid cigarette smoke. If you smoke, quit smoking. If you need help quitting, ask your health care provider.  Avoid caffeine.  Avoid alcohol.  Rest as needed. SEEK MEDICAL CARE IF:   You have new symptoms.  You cough up pus.  Your cough does not get better after 2-3 weeks, or your cough gets worse.  You cannot control your cough with suppressant medicines and you are losing sleep.  You develop pain that is getting worse or pain that is not controlled with pain medicines.  You have a fever.  You have unexplained weight loss.  You have night sweats. SEEK IMMEDIATE MEDICAL CARE IF:  You cough up blood.  You have difficulty breathing.  Your heartbeat is very fast.   This information is not intended to replace advice given to you by your health care provider. Make sure you discuss any questions you have with your health care provider.   Document Released: 07/12/2010 Document Revised: 10/04/2014 Document Reviewed: 03/22/2014 Elsevier Interactive Patient Education [employer].     I  personally performed the services described in this documentation, which was scribed in my presence. The recorded information has been reviewed and considered, and addended by me as needed.

## 2014-11-20 NOTE — Patient Instructions (Signed)
As cough is improving - can continue Zantac as heartburn is likely cause. If cough not resolving in next few weeks - change Zantac to Prilosec once per day.  For allergies - you can take over the counter Allegra, Zyrtec, or Claritin once per day. Follow  Up with me in 2-3 months if cough not resolving. Return to the clinic or go to the nearest emergency room if any of your symptoms worsen or new symptoms occur.  Cough, Adult Coughing is a reflex that clears your throat and your airways. Coughing helps to heal and protect your lungs. It is normal to cough occasionally, but a cough that happens with other symptoms or lasts a long time may be a sign of a condition that needs treatment. A cough may last only 2-3 weeks (acute), or it may last longer than 8 weeks (chronic). CAUSES Coughing is commonly caused by:  Breathing in substances that irritate your lungs.  A viral or bacterial respiratory infection.  Allergies.  Asthma.  Postnasal drip.  Smoking.  Acid backing up from the stomach into the esophagus (gastroesophageal reflux).  Certain medicines.  Chronic lung problems, including COPD (or rarely, lung cancer).  Other medical conditions such as heart failure. HOME CARE INSTRUCTIONS  Pay attention to any changes in your symptoms. Take these actions to help with your discomfort:  Take medicines only as told by your health care provider.  If you were prescribed an antibiotic medicine, take it as told by your health care provider. Do not stop taking the antibiotic even if you start to feel better.  Talk with your health care provider before you take a cough suppressant medicine.  Drink enough fluid to keep your urine clear or pale yellow.  If the air is dry, use a cold steam vaporizer or humidifier in your bedroom or your home to help loosen secretions.  Avoid anything that causes you to cough at work or at home.  If your cough is worse at night, try sleeping in a semi-upright  position.  Avoid cigarette smoke. If you smoke, quit smoking. If you need help quitting, ask your health care provider.  Avoid caffeine.  Avoid alcohol.  Rest as needed. SEEK MEDICAL CARE IF:   You have new symptoms.  You cough up pus.  Your cough does not get better after 2-3 weeks, or your cough gets worse.  You cannot control your cough with suppressant medicines and you are losing sleep.  You develop pain that is getting worse or pain that is not controlled with pain medicines.  You have a fever.  You have unexplained weight loss.  You have night sweats. SEEK IMMEDIATE MEDICAL CARE IF:  You cough up blood.  You have difficulty breathing.  Your heartbeat is very fast.   This information is not intended to replace advice given to you by your health care provider. Make sure you discuss any questions you have with your health care provider.   Document Released: 07/12/2010 Document Revised: 10/04/2014 Document Reviewed: 03/22/2014 Elsevier Interactive Patient Education [employer].

## 2014-11-22 ENCOUNTER — Encounter: Payer: Self-pay | Admitting: Family Medicine

## 2015-02-12 ENCOUNTER — Ambulatory Visit (INDEPENDENT_AMBULATORY_CARE_PROVIDER_SITE_OTHER): Payer: BLUE CROSS/BLUE SHIELD | Admitting: Family Medicine

## 2015-02-12 ENCOUNTER — Encounter: Payer: Self-pay | Admitting: Family Medicine

## 2015-02-12 VITALS — BP 151/90 | HR 66 | Temp 98.3°F | Resp 16 | Ht 66.0 in | Wt 183.0 lb

## 2015-02-12 DIAGNOSIS — R7303 Prediabetes: Secondary | ICD-10-CM | POA: Diagnosis not present

## 2015-02-12 DIAGNOSIS — E785 Hyperlipidemia, unspecified: Secondary | ICD-10-CM | POA: Diagnosis not present

## 2015-02-12 DIAGNOSIS — I10 Essential (primary) hypertension: Secondary | ICD-10-CM

## 2015-02-12 DIAGNOSIS — R05 Cough: Secondary | ICD-10-CM

## 2015-02-12 DIAGNOSIS — R059 Cough, unspecified: Secondary | ICD-10-CM

## 2015-02-12 MED ORDER — ROSUVASTATIN CALCIUM 20 MG PO TABS: 20.0000 mg | ORAL_TABLET | Freq: Every day | ORAL | Status: DC

## 2015-02-12 NOTE — Progress Notes (Signed)
Subjective:  By signing my name below, I, Raven Small, attest that this documentation has been prepared under the direction and in the presence of [name], MD.  Electronically Signed: [name], ED Scribe. 02/12/2015. 9:58 AM.   Patient ID: Samuel Macias, male    DOB: 07-18-1955, 60 y.o.   MRN: [id number]  HPI   Chief Complaint  Patient presents with  . Follow-up  . Cough  . Hypertension   HPI Comments: JAMEE BUCHKO is a 60 y.o. male who presents to the [facility] f/u of HTN.  Hypertension   Lab Results  Component Value Date   CREATININE 0.71 10/23/2014   takes lisinopril 10 mg and torprol 10 mg qd and aspirin 81 mg a day. Pt checks BP at home which has been doing well. He did not bring readings in today.  Cough He did have a cough last year. Poss ace inhibitor cough but no change with stopping ace inhibitor. CXR in 05/2014, hyperinflation poss COPD only 3 pack hx of smoking when he was younger. Second hand smoke from parents. Suspect GERD/LPR component. treated with zantac with improvement of cough. Advised to change to Prilosec of not completely resolve and OTC antihistamine for allergies.   Pt takes zyrtec twice a day and another OTC allergy medication once a day. Though medication has helped with cough he still has daily cough States cough is more dry during the night. He did tried Prilosec for 14 days without improvement. He drinks 2 caffeine drinks a day and up to 3-4 beers 3-4 times a week especially on weekends. He has noticed worsening cough when drinking beer. Normal EKG and spirometry.   He denies unexplained weight loss, night sweats, SOB, HA, light headed and dizziness.  Prediabetes  Lab Results  Component Value Date   HGBA1C 5.9 11/20/2014   Wt Readings from Last 3 Encounters:  02/12/15 183 lb (83.008 kg)  11/20/14 176 lb (79.833 kg)  10/23/14 178 lb (80.74 kg)   Pt exercises 2 days a week on elliptical and treadmill. He  has a mild cough with exercise, but no SOB.   Patient Active Problem List   Diagnosis Date Noted  . Hypertension 03/01/2011  . Hyperlipidemia 03/01/2011  . Personal history of colonic polyps - adenomas 11/05/2006   Past Medical History  Diagnosis Date  . Hypertension   . Hyperlipidemia   . Personal history of colonic polyps - adenomas 11/05/2006   Past Surgical History  Procedure Laterality Date  . Pain block      Herniated disc repair  . Wrist surgery      tendon  . External ear surgery      as child   No Known Allergies Prior to Admission medications   Medication Sig Start Date End Date Taking? Authorizing Provider  aspirin 81 MG tablet Take 81 mg by mouth daily.   Yes Historical Provider, MD  lisinopril (PRINIVIL,ZESTRIL) 10 MG tablet Take 1 tablet (10 mg total) by mouth daily. 10/23/14  Yes Wendie Agreste, MD  metoprolol succinate (TOPROL-XL) 50 MG 24 hr tablet TAKE 1 TABLET BY MOUTH EVERY DAY 03/09/14  Yes Wendie Agreste, MD  Omega-3 Fatty Acids (FISH OIL) 1200 MG CAPS Take 1 capsule by mouth daily.   Yes Historical Provider, MD  rosuvastatin (CRESTOR) 20 MG tablet Take 1 tablet (20 mg total) by mouth daily. 08/22/14  Yes Tereasa Coop, PA-C  sildenafil (VIAGRA) 100 MG tablet Take 0.5-1 tablets (  50-100 mg total) by mouth daily as needed for erectile dysfunction. 06/09/14  Yes Tereasa Coop, PA-C   Social History   Social History  . Marital Status: Married    Spouse Name: [place]  . Number of Children: 2  . Years of Education: 12th grade   Occupational History  . retired Soil scientist    Social History Main Topics  . Smoking status: Former Smoker -- 0.50 packs/day for 5 years    Types: Cigarettes  . Smokeless tobacco: Never Used  . Alcohol Use: 6.0 oz/week    10 Cans of beer per week  . Drug Use: No  . Sexual Activity: Yes   Other Topics Concern  . Not on file   Social History Narrative   Lives with his wife and son, [name]. His daughter  lives independently in [place], [place].   Review of Systems  Constitutional: Negative for diaphoresis, appetite change, fatigue and unexpected weight change.  HENT: Negative for postnasal drip.   Eyes: Negative for visual disturbance.  Respiratory: Positive for cough. Negative for chest tightness and shortness of breath.   Cardiovascular: Negative for chest pain, palpitations and leg swelling.  Gastrointestinal: Negative for abdominal pain and blood in stool.  Neurological: Negative for dizziness, light-headedness and headaches.   Objective:   Physical Exam  Constitutional: He is oriented to person, place, and time. He appears well-developed and well-nourished. No distress.  HENT:  Head: Normocephalic and atraumatic.  Eyes: Conjunctivae and EOM are normal. Pupils are equal, round, and reactive to light.  Neck: Neck supple. No JVD present. Carotid bruit is not present.  Cardiovascular: Normal rate, regular rhythm and normal heart sounds.   No murmur heard. Pulmonary/Chest: Effort normal and breath sounds normal. He has no rales.  Musculoskeletal: Normal range of motion. He exhibits no edema.  Neurological: He is alert and oriented to person, place, and time.  Skin: Skin is warm and dry.  Psychiatric: He has a normal mood and affect. His behavior is normal.  Nursing note and vitals reviewed.  Filed Vitals:   02/12/15 0917  BP: 151/90  Pulse: 66  Temp: 98.3 F (36.8 C)  Resp: 16  Height: 5\' 6"  (1.676 m)  Weight: 183 lb (83.008 kg)   Assessment & Plan:   KASIMIR ELLENBURG is a 60 y.o. male Essential hypertension  - elevated in office, but reported improved control at home. May be component of alcohol from last night.   -decrease alcohol to no more than 1-2 drinks per day.   -continue same dose meds for now.  -watch diet and increased exercise discussed.   -plan on labs at CPE in 3 months.   Cough - Plan: Ambulatory referral to Pulmonology  -suspected upper airway cough  syndrome, with some improvement on zantac for GERD/LPR, and otc H1blocker, but still persistent daily cough. No change in sx's with change to PPI for 2 weeks, so back in Zantac. No change in sx's in past off of ACE-I. EKG and spirometry WNL prior.   -refer to pulmonary for further eval.  -cut back on alcohol as above.   -rtc precautions if worsening.   Prediabetes  --work on weight loss as above, plan on A1c at upcoming CPE.   Hyperlipidemia - Plan: rosuvastatin (CRESTOR) 20 MG tablet  -tolerating Crestor, refilled. Plan on FLP and CMP at upcoming CPE.   Meds ordered this encounter  Medications  . rosuvastatin (CRESTOR) 20 MG tablet    Sig: Take 1  tablet (20 mg total) by mouth daily.    Dispense:  90 tablet    Refill:  1   Patient Instructions  Work on weight loss with increasing exercise to most days of the week.  I will refer you to pulmonary to discuss cough further, but for now cut back to no more than 2 alcoholic drinks per day and cut back to 1 caffeine drink per day.  Keep a record of your blood pressures outside of the office and bring them to the next office visit. If remaining over 140/90 - may need to change meds.  Follow up for physical in 3 months. Return to the clinic or go to the nearest emergency room if any of your symptoms worsen or new symptoms occur.  Cough, Adult Coughing is a reflex that clears your throat and your airways. Coughing helps to heal and protect your lungs. It is normal to cough occasionally, but a cough that happens with other symptoms or lasts a long time may be a sign of a condition that needs treatment. A cough may last only 2-3 weeks (acute), or it may last longer than 8 weeks (chronic). CAUSES Coughing is commonly caused by:  Breathing in substances that irritate your lungs.  A viral or bacterial respiratory infection.  Allergies.  Asthma.  Postnasal drip.  Smoking.  Acid backing up from the stomach into the esophagus  (gastroesophageal reflux).  Certain medicines.  Chronic lung problems, including COPD (or rarely, lung cancer).  Other medical conditions such as heart failure. HOME CARE INSTRUCTIONS  Pay attention to any changes in your symptoms. Take these actions to help with your discomfort:  Take medicines only as told by your health care provider.  If you were prescribed an antibiotic medicine, take it as told by your health care provider. Do not stop taking the antibiotic even if you start to feel better.  Talk with your health care provider before you take a cough suppressant medicine.  Drink enough fluid to keep your urine clear or pale yellow.  If the air is dry, use a cold steam vaporizer or humidifier in your bedroom or your home to help loosen secretions.  Avoid anything that causes you to cough at work or at home.  If your cough is worse at night, try sleeping in a semi-upright position.  Avoid cigarette smoke. If you smoke, quit smoking. If you need help quitting, ask your health care provider.  Avoid caffeine.  Avoid alcohol.  Rest as needed. SEEK MEDICAL CARE IF:   You have new symptoms.  You cough up pus.  Your cough does not get better after 2-3 weeks, or your cough gets worse.  You cannot control your cough with suppressant medicines and you are losing sleep.  You develop pain that is getting worse or pain that is not controlled with pain medicines.  You have a fever.  You have unexplained weight loss.  You have night sweats. SEEK IMMEDIATE MEDICAL CARE IF:  You cough up blood.  You have difficulty breathing.  Your heartbeat is very fast.   This information is not intended to replace advice given to you by your health care provider. Make sure you discuss any questions you have with your health care provider.   Document Released: 07/12/2010 Document Revised: 10/04/2014 Document Reviewed: 03/22/2014 Elsevier Interactive Patient Education [employer].      I personally performed the services described in this documentation, which was scribed in my presence. The recorded information  has been reviewed and considered, and addended by me as needed.

## 2015-02-12 NOTE — Patient Instructions (Signed)
Work on weight loss with increasing exercise to most days of the week.  I will refer you to pulmonary to discuss cough further, but for now cut back to no more than 2 alcoholic drinks per day and cut back to 1 caffeine drink per day.  Keep a record of your blood pressures outside of the office and bring them to the next office visit. If remaining over 140/90 - may need to change meds.  Follow up for physical in 3 months. Return to the clinic or go to the nearest emergency room if any of your symptoms worsen or new symptoms occur.  Cough, Adult Coughing is a reflex that clears your throat and your airways. Coughing helps to heal and protect your lungs. It is normal to cough occasionally, but a cough that happens with other symptoms or lasts a long time may be a sign of a condition that needs treatment. A cough may last only 2-3 weeks (acute), or it may last longer than 8 weeks (chronic). CAUSES Coughing is commonly caused by:  Breathing in substances that irritate your lungs.  A viral or bacterial respiratory infection.  Allergies.  Asthma.  Postnasal drip.  Smoking.  Acid backing up from the stomach into the esophagus (gastroesophageal reflux).  Certain medicines.  Chronic lung problems, including COPD (or rarely, lung cancer).  Other medical conditions such as heart failure. HOME CARE INSTRUCTIONS  Pay attention to any changes in your symptoms. Take these actions to help with your discomfort:  Take medicines only as told by your health care provider.  If you were prescribed an antibiotic medicine, take it as told by your health care provider. Do not stop taking the antibiotic even if you start to feel better.  Talk with your health care provider before you take a cough suppressant medicine.  Drink enough fluid to keep your urine clear or pale yellow.  If the air is dry, use a cold steam vaporizer or humidifier in your bedroom or your home to help loosen secretions.  Avoid  anything that causes you to cough at work or at home.  If your cough is worse at night, try sleeping in a semi-upright position.  Avoid cigarette smoke. If you smoke, quit smoking. If you need help quitting, ask your health care provider.  Avoid caffeine.  Avoid alcohol.  Rest as needed. SEEK MEDICAL CARE IF:   You have new symptoms.  You cough up pus.  Your cough does not get better after 2-3 weeks, or your cough gets worse.  You cannot control your cough with suppressant medicines and you are losing sleep.  You develop pain that is getting worse or pain that is not controlled with pain medicines.  You have a fever.  You have unexplained weight loss.  You have night sweats. SEEK IMMEDIATE MEDICAL CARE IF:  You cough up blood.  You have difficulty breathing.  Your heartbeat is very fast.   This information is not intended to replace advice given to you by your health care provider. Make sure you discuss any questions you have with your health care provider.   Document Released: 07/12/2010 Document Revised: 10/04/2014 Document Reviewed: 03/22/2014 Elsevier Interactive Patient Education [employer].

## 2015-02-14 ENCOUNTER — Encounter: Payer: Self-pay | Admitting: Cardiovascular Disease

## 2015-02-23 ENCOUNTER — Encounter: Payer: Self-pay | Admitting: Internal Medicine

## 2015-03-09 ENCOUNTER — Institutional Professional Consult (permissible substitution): Payer: Self-pay | Admitting: Internal Medicine

## 2015-03-27 ENCOUNTER — Institutional Professional Consult (permissible substitution): Payer: Self-pay | Admitting: Internal Medicine

## 2015-04-06 ENCOUNTER — Institutional Professional Consult (permissible substitution): Payer: Self-pay | Admitting: Internal Medicine

## 2015-04-26 ENCOUNTER — Encounter: Payer: Self-pay | Admitting: Pulmonary Disease

## 2015-04-26 ENCOUNTER — Ambulatory Visit (INDEPENDENT_AMBULATORY_CARE_PROVIDER_SITE_OTHER): Payer: BLUE CROSS/BLUE SHIELD | Admitting: Pulmonary Disease

## 2015-04-26 VITALS — BP 150/80 | HR 57 | Temp 98.4°F | Ht 66.0 in | Wt 178.0 lb

## 2015-04-26 DIAGNOSIS — J387 Other diseases of larynx: Secondary | ICD-10-CM | POA: Diagnosis not present

## 2015-04-26 DIAGNOSIS — R05 Cough: Secondary | ICD-10-CM | POA: Diagnosis not present

## 2015-04-26 DIAGNOSIS — R059 Cough, unspecified: Secondary | ICD-10-CM | POA: Insufficient documentation

## 2015-04-26 DIAGNOSIS — K219 Gastro-esophageal reflux disease without esophagitis: Secondary | ICD-10-CM | POA: Diagnosis not present

## 2015-04-26 MED ORDER — LOSARTAN POTASSIUM 100 MG PO TABS: 100.0000 mg | ORAL_TABLET | Freq: Every day | ORAL | Status: DC

## 2015-04-26 NOTE — Progress Notes (Signed)
Subjective:     Patient ID: [facility] Macias, male   DOB: Apr 09, 1955, 60 y.o.   MRN: [id number]  HPI ~  April 26, 2015:  Initial pulmonary evaluation by SN>      76 y/o WM referred by Dr. Merri Macias, [facility] on [facility], for evaluation of a chronic cough>  Pt states he's been coughing for about 37yr, mostly a dry hacking cough, rarely productive, no hx hemoptysis, denis SOB/ CP/ etc; recalls that he caught a cold/ flu-bug in early 2016 & treated by [facility] PCP at [facility] w/ improvement after the meds but w/ persistent cough (wife & son really noticed it);  When he returned to office they realized he was on Lisinopril for BP & stopped this med but as there was no change in the cough off the ACE it was restarted a few weeks later;  They then placed him on Zantac for reflux since cough was worse at night (some better) and later increased this to Prilosec (didn't help);  Avail notes in EPIC reviewed> Exams were reported clear; CXR 06/01/14 was clear & wnl (radiology questioned sl overinflated); Spirometry reported normal in 06/09/14 note but i cannot find the tracing anywhere in Epic; he was treated w/ Augmentin, Prednisone taper, later ZPak...   Smoking Hx>  Min/  remote smoking hx: started age 35, smoked for 71yrs, up to 1/2ppd, and quit in 38 at age 77- for a total 3pack-yr smoking hx...  Pulmonary Hx>  He's had occas bronchitic infctions, responds readily to antibiotic rx w/o recurrence or refractory cough; no hx asthma, pneumonia, Tb or known exposure...  Medical Hx>  HBP, HL, colon polyps- followed [facility] at [facility]...  Family Hx>  Parents were smokers, Mother died age 43 w/ COPD on O2; no other hx lung dis in the family...  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  EXAM shows Afeb, VSS (BP=150/80), O2sat=97% on RA;  HEENT- neg, mallampati1;  Chest- clear w/o  w/r/r;  Heart- RR w/o m/r/g;  Abd- soft, nontender, neg;  Ext- neg w/o c/c/e;  Neuro- intact...  Last CXR done 55/16 showed norm heart size, clear lungs, NAD...  Spirometry 04/26/15>  FVC=3.52 (85%), FEV1=2.65 (81%), %1sec=75, mid-flows sl reduced at 64% predicted; This is c/w some mild small airways dis but otherw OK... IMP >>     Prob ACE cough>  Prev 2wks off the ACE inhibitor may not have been long enough to see resolution; we decided to STOP LISINOPRIL, Rx written for LOSARTAN100...    GERD/ LPR>  His nocturnal symptoms certainly point in this direction & it may be a confounding factor;  He notes that Omep prev w/o benefit but he was taking it Qhs & not following an antireflux regimen- we reviewed this in detail (see AVS).    Medical issues>  Allergic rhinitis, HBP, HL, hx colon polyps,  PLAN >>  STOP LISINOPRIL & replace it w/ Losartan100mg /d for his BP... We discussed a vigorous antireflux regimen to include:  OMEPRAZOLE40 take 78min before dinner,  NPO after dinner,  Elevate HOB 6" blocks... He can use cough drops, lozenges, sugarless gum, DELSYM prn... We plan brief recheck in about 6wks to document response to Rx & see if any further evaluation is warranted...    Past Medical History  Diagnosis Date  . Hypertension   . Hyperlipidemia   . Personal  history of colonic polyps - adenomas 11/05/2006    Past Surgical History  Procedure Laterality Date  . Pain block      Herniated disc repair  . Wrist surgery      tendon  . External ear surgery      as child    Outpatient Encounter Prescriptions as of 04/26/2015  Medication Sig  . aspirin 81 MG tablet Take 81 mg by mouth daily.  . metoprolol succinate (TOPROL-XL) 50 MG 24 hr tablet TAKE 1 TABLET BY MOUTH EVERY DAY  . Omega-3 Fatty Acids (FISH OIL) 1200 MG CAPS Take 1 capsule by mouth daily.  . ranitidine (ZANTAC) 150 MG tablet Take 150 mg by mouth 2 (two) times daily.  . rosuvastatin (CRESTOR) 20 MG tablet Take 1 tablet (20 mg  total) by mouth daily.  . sildenafil (VIAGRA) 100 MG tablet Take 0.5-1 tablets (50-100 mg total) by mouth daily as needed for erectile dysfunction.  Marland Kitchen  lisinopril (PRINIVIL,ZESTRIL) 10 MG tablet Take 1 tablet (10 mg total) by mouth daily.    No Known Allergies   Immunization History  Administered Date(s) Administered  . Influenza,inj,Quad PF,36+ Mos 10/23/2014  . Tdap 09/30/2006    Family History  Problem Relation Age of Onset  . Crohn's disease Brother   . Colon cancer Neg Hx   . COPD Mother   Mother was a smoker w/ COPD, on oxygen, died at 19...   Social History   Social History  . Marital Status: Married    Spouse Name: [place]  . Number of Children: 2  . Years of Education: 12th grade   Occupational History  . retired Soil scientist    Social History Main Topics  . Smoking status: Former Smoker -- 0.40 packs/day for 5 years    Types: Cigarettes    Quit date: 01/27/1981  . Smokeless tobacco: Former Systems developer    Types: [place] date: 01/27/1981  . Alcohol Use: 6.0 oz/week    10 Cans of beer per week     Comment: 12 pack weekly  . Drug Use: No  . Sexual Activity: Yes   Other Topics Concern  . Not on file   Social History Narrative   Lives with his wife and son, [facility]. His daughter lives independently in [place], [place].    Current Medications, Allergies, Past Medical History, Past Surgical History, Family History, and Social History were reviewed in [employer] record.   Review of Systems             All symptoms NEG except where BOLDED >>  Constitutional:  F/C/S, fatigue, anorexia, unexpected weight change. HEENT:  HA, visual changes, hearing loss, earache, nasal symptoms, sore throat, mouth sores, hoarseness. Resp:  cough, sputum, hemoptysis; SOB, tightness, wheezing. Cardio:  CP, palpit, DOE, orthopnea, edema. GI:  N/V/D/C, blood in stool; reflux, abd pain, distention, gas. GU:  dysuria, freq, urgency, hematuria,  flank pain, voiding difficulty. MS:  joint pain, swelling, tenderness, decr ROM; neck pain, back pain, etc. Neuro:  HA, tremors, seizures, dizziness, syncope, weakness, numbness, gait abn. Skin:  suspicious lesions or skin rash. Heme:  adenopathy, bruising, bleeding. Psyche:  confusion, agitation, sleep disturbance, hallucinations, anxiety, depression suicidal.   Objective:   Physical Exam       Vital Signs:  Reviewed...  General:  WD, WN, 60 y/o WM in NAD; alert & oriented; pleasant & cooperative... HEENT:  [place]/AT; Conjunctiva- pink, Sclera- nonicteric, EOM-wnl, PERRLA, Fundi-benign; EACs-clear, TMs-wnl;  NOSE-clear; THROAT-clear & wnl. Neck:  Supple w/ full ROM; no JVD; normal carotid impulses w/o bruits; no thyromegaly or nodules palpated; no lymphadenopathy. Chest:  Clear to P & A; without wheezes, rales, or rhonchi heard. Heart:  Regular Rhythm; norm S1 & S2 without murmurs, rubs, or gallops detected. Abdomen:  Soft & nontender- no guarding or rebound; normal bowel sounds; no organomegaly or masses palpated. Ext:  Normal ROM; without deformities or arthritic changes; no varicose veins, venous insuffic, or edema;  Pulses intact w/o bruits. Neuro:  No focal neuro deficits, gait normal & balance OK. Derm:  No lesions noted; no rash etc. Lymph:  No cervical, supraclavicular, axillary, or inguinal adenopathy palpated.   Assessment:      IMP >>     Prob ACE cough>  Prev 2wks off the ACE inhibitor may not have been long enough to see resolution; we decided to STOP LISINOPRIL, Rx written for LOSARTAN100...    GERD/ LPR>  His nocturnal symptoms certainly point in this direction & it may be a confounding factor;  He notes that Omep prev w/o benefit but he was taking it Qhs & not following an antireflux regimen- we reviewed this in detail (see AVS).    Medical issues>  Allergic rhinitis, HBP, HL, hx colon polyps,   PLAN >>  STOP LISINOPRIL & replace it w/ Losartan100mg /d for his BP... We  discussed a vigorous antireflux regimen to include:  OMEPRAZOLE40 take 71min before dinner,  NPO after dinner,  Elevate HOB 6" blocks... He can use cough drops, lozenges, sugarless gum, DELSYM prn... We plan brief recheck in about 6wks to document response to Rx & see if any further evaluation is warranted...     Plan:     Patient's Medications  New Prescriptions   LOSARTAN (COZAAR) 100 MG TABLET    Take 1 tablet (100 mg total) by mouth daily.  Previous Medications   ASPIRIN 81 MG TABLET    Take 81 mg by mouth daily.   METOPROLOL SUCCINATE (TOPROL-XL) 50 MG 24 HR TABLET    TAKE 1 TABLET BY MOUTH EVERY DAY   OMEGA-3 FATTY ACIDS (FISH OIL) 1200 MG CAPS    Take 1 capsule by mouth daily.   RANITIDINE (ZANTAC) 150 MG TABLET    Take 150 mg by mouth 2 (two) times daily.   ROSUVASTATIN (CRESTOR) 20 MG TABLET    Take 1 tablet (20 mg total) by mouth daily.   SILDENAFIL (VIAGRA) 100 MG TABLET    Take 0.5-1 tablets (50-100 mg total) by mouth daily as needed for erectile dysfunction.  Modified Medications   No medications on file  Discontinued Medications   LISINOPRIL (PRINIVIL,ZESTRIL) 10 MG TABLET    Take 1 tablet (10 mg total) by mouth daily.

## 2015-04-26 NOTE — Patient Instructions (Signed)
[facility]uel [facility]ias-- it was great meeting you today...  We reviewed your medical history, your prev CXR and we did a spirometry breathing test...  We discussed the most common mechanisms resulting in a chronic cough...  In your situation we decided to do the following>       1) STOP THE LISINOPRIL & replace this BP med w/ LOSARTAN 100mg  one tab daily.Marland KitchenMarland Kitchen    2) Start a vigorous ANTIREFLUX regimen as follows>       A)  Take the OMEPRAZOLE 40mg  about 30 min before dinner in the eve...       B)  Do NOT eat or drink much after dinner (let your stomach totally empty out before bedtime)       C)  Elevate the head of your bed 6" on blocks (it's a pain I know but important to do this!)  You may use cough drops, throat lozenges, sugarless gum, or the OC meds like DELSYM- 2tsp twice daily as needed...  Call for any questions...  Let's plan a follow up visit in 6-8 weeks, sooner if needed for problems.Marland KitchenMarland Kitchen

## 2015-05-16 ENCOUNTER — Ambulatory Visit (INDEPENDENT_AMBULATORY_CARE_PROVIDER_SITE_OTHER): Payer: BLUE CROSS/BLUE SHIELD | Admitting: Family Medicine

## 2015-05-16 ENCOUNTER — Encounter: Payer: Self-pay | Admitting: Family Medicine

## 2015-05-16 VITALS — BP 140/94 | HR 65 | Temp 97.9°F | Resp 16 | Ht 65.75 in | Wt 175.2 lb

## 2015-05-16 DIAGNOSIS — I10 Essential (primary) hypertension: Secondary | ICD-10-CM

## 2015-05-16 DIAGNOSIS — E785 Hyperlipidemia, unspecified: Secondary | ICD-10-CM

## 2015-05-16 DIAGNOSIS — Z0000 Encounter for general adult medical examination without abnormal findings: Secondary | ICD-10-CM | POA: Diagnosis not present

## 2015-05-16 DIAGNOSIS — Z23 Encounter for immunization: Secondary | ICD-10-CM

## 2015-05-16 DIAGNOSIS — R7303 Prediabetes: Secondary | ICD-10-CM | POA: Diagnosis not present

## 2015-05-16 DIAGNOSIS — Z125 Encounter for screening for malignant neoplasm of prostate: Secondary | ICD-10-CM | POA: Diagnosis not present

## 2015-05-16 DIAGNOSIS — Z1159 Encounter for screening for other viral diseases: Secondary | ICD-10-CM

## 2015-05-16 DIAGNOSIS — Z114 Encounter for screening for human immunodeficiency virus [HIV]: Secondary | ICD-10-CM | POA: Diagnosis not present

## 2015-05-16 LAB — COMPLETE METABOLIC PANEL WITH GFR
ALBUMIN: 4.7 g/dL (ref 3.6–5.1)
ALK PHOS: 43 U/L (ref 40–115)
ALT: 29 U/L (ref 9–46)
AST: 26 U/L (ref 10–35)
BUN: 17 mg/dL (ref 7–25)
CALCIUM: 9.5 mg/dL (ref 8.6–10.3)
CO2: 23 mmol/L (ref 20–31)
CREATININE: 0.92 mg/dL (ref 0.70–1.25)
Chloride: 99 mmol/L (ref 98–110)
GFR, Est African American: >89 mL/min (ref >=60)
GFR, Est Non African American: >89 mL/min (ref >=60)
GLUCOSE: 112 mg/dL — AB (ref 65–99)
Potassium: 4.2 mmol/L (ref 3.5–5.3)
SODIUM: 135 mmol/L (ref 135–146)
TOTAL PROTEIN: 7.4 g/dL (ref 6.1–8.1)
Total Bilirubin: 0.8 mg/dL (ref 0.2–1.2)

## 2015-05-16 LAB — POCT URINALYSIS DIP (MANUAL ENTRY)
BILIRUBIN UA: NEGATIVE
Bilirubin, UA: NEGATIVE
GLUCOSE UA: NEGATIVE
Leukocytes, UA: NEGATIVE
Nitrite, UA: NEGATIVE
Protein Ur, POC: NEGATIVE
RBC UA: NEGATIVE
SPEC GRAV UA: 1.015
UROBILINOGEN UA: 0.2
pH, UA: 7

## 2015-05-16 LAB — LIPID PANEL
CHOL/HDL RATIO: 3.4 ratio (ref <=5.0)
CHOLESTEROL: 206 mg/dL — AB (ref 125–200)
HDL: 60 mg/dL (ref >=40)
LDL Cholesterol: 121 mg/dL (ref <130)
Triglycerides: 127 mg/dL (ref <150)
VLDL: 25 mg/dL (ref <30)

## 2015-05-16 LAB — HEMOGLOBIN A1C
HEMOGLOBIN A1C: 6.4 % — AB (ref <5.7)
Mean Plasma Glucose: 137 mg/dL

## 2015-05-16 LAB — HIV ANTIBODY (ROUTINE TESTING W REFLEX): HIV: NONREACTIVE

## 2015-05-16 MED ORDER — ZOSTER VACCINE LIVE 19400 UNT/0.65ML ~~LOC~~ SOLR: 0.6500 mL | Freq: Once | SUBCUTANEOUS | Status: DC

## 2015-05-16 NOTE — Patient Instructions (Addendum)
IF you received an x-ray today, you will receive an invoice from [facility]. Please contact [facility] at [phone] with questions or concerns regarding your invoice.   IF you received labwork today, you will receive an invoice from [facility]. Please contact [facility] at [phone] with questions or concerns regarding your invoice.   Our billing staff will not be able to assist you with questions regarding bills from these companies.  You will be contacted with the lab results as soon as they are available. The fastest way to get your results is to activate your My Chart account. Instructions are located on the last page of this paperwork. If you have not heard from [place] regarding the results in 2 weeks, please contact this office.    Keep a record of your blood pressures outside of the office and if remaining over 140/90 in next week - return to discuss medication changes.   Check into cost/coverage for shingles vaccine, but this was printed if you would like to have it given at your pharmacy.   Keeping you healthy  Get these tests  Blood pressure- Have your blood pressure checked once a year by your healthcare provider.  Normal blood pressure is 120/80  Weight- Have your body mass index (BMI) calculated to screen for obesity.  BMI is a measure of body fat based on height and weight. You can also calculate your own BMI at ViewBank[employer].si.  Cholesterol- Have your cholesterol checked every year.  Diabetes- Have your blood sugar checked regularly if you have high blood pressure, high cholesterol, have a family history of diabetes or if you are overweight.  Screening for Colon Cancer- Colonoscopy starting at age 55.  Screening may begin sooner depending on your family history and other health conditions. Follow up colonoscopy as directed by your Gastroenterologist.  Screening for Prostate Cancer- Both blood work (PSA) and a  rectal exam help screen for Prostate Cancer.  Screening begins at age 73 with African-American men and at age 3 with Caucasian men.  Screening may begin sooner depending on your family history.  Take these medicines  Aspirin- One aspirin daily can help prevent Heart disease and Stroke.  Flu shot- Every fall.  Tetanus- Every 10 years.  Zostavax- Once after the age of 1 to prevent Shingles.  Pneumonia shot- Once after the age of 80; if you are younger than 61, ask your healthcare provider if you need a Pneumonia shot.  Take these steps  Don't smoke- If you do smoke, talk to your doctor about quitting.  For tips on how to quit, go to www.smokefree.gov or call 1-800-QUIT-NOW.  Be physically active- Exercise 5 days a week for at least 30 minutes.  If you are not already physically active start slow and gradually work up to 30 minutes of moderate physical activity.  Examples of moderate activity include walking briskly, mowing the yard, dancing, swimming, bicycling, etc.  Eat a healthy diet- Eat a variety of healthy food such as fruits, vegetables, low fat milk, low fat cheese, yogurt, lean meant, poultry, fish, beans, tofu, etc. For more information go to www.thenutritionsource.org  Drink alcohol in moderation- Limit alcohol intake to less than two drinks a day. Never drink and drive.  Dentist- Brush and floss twice daily; visit your dentist twice a year.  Depression- Your emotional health is as important as your physical health. If you're feeling down, or losing interest in things you would normally enjoy please talk to your  healthcare provider.  Eye exam- Visit your eye doctor every year.  Safe sex- If you may be exposed to a sexually transmitted infection, use a condom.  Seat belts- Seat belts can save your life; always wear one.  Smoke/Carbon Monoxide detectors- These detectors need to be installed on the appropriate level of your home.  Replace batteries at least once a  year.  Skin cancer- When out in the sun, cover up and use sunscreen 15 SPF or higher.  Violence- If anyone is threatening you, please tell your healthcare provider.  Living Will/ Health care power of attorney- Speak with your healthcare provider and family.

## 2015-05-16 NOTE — Progress Notes (Signed)
   Subjective:    Patient ID: Samuel Macias, male    DOB: Dec 15, 1955, 60 y.o.   MRN: [id number]  HPI    Review of Systems  Constitutional: Negative.   HENT: Negative.   Eyes: Negative.   Respiratory: Negative.   Cardiovascular: Negative.   Gastrointestinal: Negative.   Endocrine: Negative.   Genitourinary: Negative.   Musculoskeletal: Negative.   Skin: Negative.   Allergic/Immunologic: Negative.   Neurological: Negative.   Hematological: Negative.   Psychiatric/Behavioral: Negative.        Objective:   Physical Exam        Assessment & Plan:

## 2015-05-16 NOTE — Progress Notes (Signed)
By signing my name below, I, Mesha Guiyard, attest that this documentation has been prepared under the direction and in the presence of Corliss Parish, MD.  Electronically Signed: [name], Medical Scribe. 05/16/2015. 10:12 AM.  Subjective:    Patient ID: Samuel Macias, male    DOB: 05/21/1955, 60 y.o.   MRN: [id number]  HPI Chief Complaint  Patient presents with  . Annual Exam   HPI Comments: Samuel Macias is a 60 y.o. male with a hx of HTN, hyperlipidemia, and GERD who presents to the [facility] for an annual exam. He didn't express any concerns for this visit. Hx of cough was seen by Dr. Lenna Gilford on March 30th. He stopped taking lisonopril and started losartan. Started metoprolol 40 mg before dinner. Elevated head on bed for possible reflux. Follow up June 1st with Dr. Lenna Gilford.   Cancer Screens: Colon Cancer Screening -Last colonoscopy was with Dr. Carlean Purl. One polyp removed. Planned for repeat in 2022. Prostate cancer Screening - Lab Results  Component Value Date   PSA 0.31 02/12/2013   PSA 0.29 03/20/2012   Immunizations: He hasn't got his shingles vaccine yet. Immunization History  Administered Date(s) Administered  . Influenza,inj,Quad PF,36+ Mos 10/23/2014  . Tdap 09/30/2006   Screening for Hep C and HIV: He hasn't got a HIV or Hep C screening yet, but is interested in both.   Depression:  Vision: Saw [facility] about a year ago.  Dentist: Saw in March, but will see again in December.  Exercise: Does exercise about 3 days out of the week.  Advance Directives: Has his living will in the making but isn't complete  Hyperlipidemia: He takes crestor 20 mg QD Lab Results  Component Value Date   CHOL 189 10/23/2014   HDL 58 10/23/2014   LDLCALC 101 10/23/2014   TRIG 150* 10/23/2014   CHOLHDL 3.3 10/23/2014   Lab Results  Component Value Date   ALT 32 10/23/2014   AST 28 10/23/2014   ALKPHOS 42 10/23/2014   BILITOT 0.4 10/23/2014    HTN: He is on 2 medications for that, as well as aspirin QD. He doesn't check bp regularly, but this morning it was 151/92. On Losartan for about 2 weeks now. Lab Results  Component Value Date   CREATININE 0.71 10/23/2014   Pre-Diabetes: Lab Results  Component Value Date   HGBA1C 5.9 11/20/2014   Wt Readings from Last 3 Encounters:  05/16/15 175 lb 3.2 oz (79.47 kg)  04/26/15 178 lb (80.74 kg)  02/12/15 183 lb (83.008 kg)  Exercising 3 days per week.  Patient Active Problem List   Diagnosis Date Noted  . Cough 04/26/2015  . GERD (gastroesophageal reflux disease) 04/26/2015  . Laryngopharyngeal reflux (LPR) 04/26/2015  . Hypertension 03/01/2011  . Hyperlipidemia 03/01/2011  . Personal history of colonic polyps - adenomas 11/05/2006   Past Medical History  Diagnosis Date  . Hypertension   . Hyperlipidemia   . Personal history of colonic polyps - adenomas 11/05/2006   Past Surgical History  Procedure Laterality Date  . Pain block      Herniated disc repair  . Wrist surgery      tendon  . External ear surgery      as child   No Known Allergies Prior to Admission medications   Medication Sig Start Date End Date Taking? Authorizing Provider  aspirin 81 MG tablet Take 81 mg by mouth daily.   Yes Historical Provider, MD  losartan (COZAAR)  100 MG tablet Take 1 tablet (100 mg total) by mouth daily. 04/26/15  Yes Noralee Space, MD  metoprolol succinate (TOPROL-XL) 50 MG 24 hr tablet TAKE 1 TABLET BY MOUTH EVERY DAY 03/09/14  Yes Wendie Agreste, MD  Omega-3 Fatty Acids (FISH OIL) 1200 MG CAPS Take 1 capsule by mouth daily.   Yes Historical Provider, MD  ranitidine (ZANTAC) 150 MG tablet Take 150 mg by mouth 2 (two) times daily.   Yes Historical Provider, MD  rosuvastatin (CRESTOR) 20 MG tablet Take 1 tablet (20 mg total) by mouth daily. 02/12/15  Yes Wendie Agreste, MD  sildenafil (VIAGRA) 100 MG tablet Take 0.5-1 tablets (50-100 mg total) by mouth daily as needed for  erectile dysfunction. 06/09/14  Yes Tereasa Coop, PA-C   Social History   Social History  . Marital Status: Married    Spouse Name: [place]  . Number of Children: 2  . Years of Education: 12th grade   Occupational History  . Delivery Driver for [employer] History Main Topics  . Smoking status: Former Smoker -- 0.40 packs/day for 5 years    Types: Cigarettes    Quit date: 01/27/1981  . Smokeless tobacco: Former Systems developer    Types: [place] date: 01/27/1981  . Alcohol Use: 7.2 oz/week    12 Standard drinks or equivalent per week     Comment: 12 pack weekly  . Drug Use: No  . Sexual Activity: Yes   Other Topics Concern  . Not on file   Social History Narrative   Lives with his wife and son, [name]. His daughter lives independently in [place], [place].   Education: High School   Exercise: Yes   Review of Systems  Constitutional: Negative for fatigue and unexpected weight change.  Eyes: Negative for visual disturbance.  Respiratory: Negative for cough, chest tightness and shortness of breath.   Cardiovascular: Negative for chest pain, palpitations and leg swelling.  Gastrointestinal: Negative for abdominal pain and blood in stool.  Neurological: Negative for dizziness, light-headedness and headaches.   13. ROS no positive findings   Objective:   Physical Exam  Constitutional: He is oriented to person, place, and time. He appears well-developed and well-nourished.  HENT:  Head: Normocephalic and atraumatic.  Right Ear: External ear normal.  Left Ear: External ear normal.  Mouth/Throat: Oropharynx is clear and moist.  Stenotic/absent right external canal  Eyes: Conjunctivae and EOM are normal. Pupils are equal, round, and reactive to light.  Neck: Normal range of motion. Neck supple. No JVD present. Carotid bruit is not present. No thyromegaly present.  Cardiovascular: Normal rate, regular rhythm, normal heart sounds and intact distal pulses.   No murmur  heard. Pulmonary/Chest: Effort normal and breath sounds normal. No respiratory distress. He has no wheezes. He has no rales.  Abdominal: Soft. He exhibits no distension. There is no tenderness. Hernia confirmed negative in the right inguinal area and confirmed negative in the left inguinal area.  Genitourinary: Prostate normal.  Musculoskeletal: Normal range of motion. He exhibits no edema or tenderness.  Lymphadenopathy:    He has no cervical adenopathy.  Neurological: He is alert and oriented to person, place, and time. He has normal reflexes.  Skin: Skin is warm and dry.  Psychiatric: He has a normal mood and affect. His behavior is normal.  Vitals reviewed.    Assessment & Plan:   Samuel Macias is a 60 y.o. male Annual physical exam  --  anticipatory guidance as below in AVS, screening labs above. Health maintenance items as above in HPI discussed/recommended as applicable.   Screening for prostate cancer - Plan: PSA  -We discussed pros and cons of prostate cancer screening, and after this discussion, he chose to have screening done. PSA obtained, and no concerning findings on DRE.   Need for shingles vaccine - Plan: zoster vaccine live, PF, (ZOSTAVAX) 16109 UNT/0.65ML injection  -Prescription printed. Can check into coverage through pharmacy.  Hyperlipidemia - Plan: COMPLETE METABOLIC PANEL WITH GFR, Lipid panel  -Tolerating Crestor. CMP, lipid panel pending. No change in dose for now.  Essential hypertension - Plan: POCT urinalysis dipstick  - Borderline elevated, but recent change in medication due to cough. Continue to monitor at home, and RTC if remains elevated.   Screening for HIV (human immunodeficiency virus) - Plan: HIV antibody  Need for hepatitis C screening test - Plan: Hepatitis C antibody  Prediabetes - Plan: Hemoglobin A1c  -Continue to watch diet, exercise.  Meds ordered this encounter  Medications  . zoster vaccine live, PF, (ZOSTAVAX) 60454 UNT/0.65ML  injection    Sig: Inject 19,400 Units into the skin once.    Dispense:  1 each    Refill:  0   Patient Instructions       IF you received an x-ray today, you will receive an invoice from [facility]. Please contact [facility] at [phone] with questions or concerns regarding your invoice.   IF you received labwork today, you will receive an invoice from [employer]. Please contact [facility] at [phone] with questions or concerns regarding your invoice.   Our billing staff will not be able to assist you with questions regarding bills from these companies.  You will be contacted with the lab results as soon as they are available. The fastest way to get your results is to activate your My Chart account. Instructions are located on the last page of this paperwork. If you have not heard from [place] regarding the results in 2 weeks, please contact this office.    Keep a record of your blood pressures outside of the office and if remaining over 140/90 in next week - return to discuss medication changes.   Check into cost/coverage for shingles vaccine, but this was printed if you would like to have it given at your pharmacy.   Keeping you healthy  Get these tests  Blood pressure- Have your blood pressure checked once a year by your healthcare provider.  Normal blood pressure is 120/80  Weight- Have your body mass index (BMI) calculated to screen for obesity.  BMI is a measure of body fat based on height and weight. You can also calculate your own BMI at ViewBanking.si.  Cholesterol- Have your cholesterol checked every year.  Diabetes- Have your blood sugar checked regularly if you have high blood pressure, high cholesterol, have a family history of diabetes or if you are overweight.  Screening for Colon Cancer- Colonoscopy starting at age 80.  Screening may begin sooner depending on your family history and other health conditions.  Follow up colonoscopy as directed by your Gastroenterologist.  Screening for Prostate Cancer- Both blood work (PSA) and a rectal exam help screen for Prostate Cancer.  Screening begins at age 73 with African-American men and at age 71 with Caucasian men.  Screening may begin sooner depending on your family history.  Take these medicines  Aspirin- One aspirin daily can help prevent Heart disease and Stroke.  Flu  shot- Every fall.  Tetanus- Every 10 years.  Zostavax- Once after the age of 28 to prevent Shingles.  Pneumonia shot- Once after the age of 1; if you are younger than 92, ask your healthcare provider if you need a Pneumonia shot.  Take these steps  Don't smoke- If you do smoke, talk to your doctor about quitting.  For tips on how to quit, go to www.smokefree.gov or call 1-800-QUIT-NOW.  Be physically active- Exercise 5 days a week for at least 30 minutes.  If you are not already physically active start slow and gradually work up to 30 minutes of moderate physical activity.  Examples of moderate activity include walking briskly, mowing the yard, dancing, swimming, bicycling, etc.  Eat a healthy diet- Eat a variety of healthy food such as fruits, vegetables, low fat milk, low fat cheese, yogurt, lean meant, poultry, fish, beans, tofu, etc. For more information go to www.thenutritionsource.org  Drink alcohol in moderation- Limit alcohol intake to less than two drinks a day. Never drink and drive.  Dentist- Brush and floss twice daily; visit your dentist twice a year.  Depression- Your emotional health is as important as your physical health. If you're feeling down, or losing interest in things you would normally enjoy please talk to your healthcare provider.  Eye exam- Visit your eye doctor every year.  Safe sex- If you may be exposed to a sexually transmitted infection, use a condom.  Seat belts- Seat belts can save your life; always wear one.  Smoke/Carbon Monoxide  detectors- These detectors need to be installed on the appropriate level of your home.  Replace batteries at least once a year.  Skin cancer- When out in the sun, cover up and use sunscreen 15 SPF or higher.  Violence- If anyone is threatening you, please tell your healthcare provider.  Living Will/ Health care power of attorney- Speak with your healthcare provider and family.    I personally performed the services described in this documentation, which was scribed in my presence. The recorded information has been reviewed and considered, and addended by me as needed.

## 2015-05-17 LAB — HEPATITIS C ANTIBODY: HCV AB: NEGATIVE

## 2015-05-17 LAB — PSA: PSA: 0.4 ng/mL (ref <=4.00)

## 2015-05-29 ENCOUNTER — Telehealth: Payer: Self-pay

## 2015-05-29 DIAGNOSIS — I10 Essential (primary) hypertension: Secondary | ICD-10-CM

## 2015-05-29 NOTE — Telephone Encounter (Signed)
Pt refused to call his pharmacy and stated we always call in his TOPROL-XL 50 MG 24 HR TABLET. Please call [phone]     [facility]

## 2015-05-30 MED ORDER — METOPROLOL SUCCINATE ER 50 MG PO TB24: ORAL_TABLET | ORAL | Status: DC

## 2015-05-30 NOTE — Telephone Encounter (Signed)
Sent in RFs and notified pt. Also went over his lab results.

## 2015-06-28 ENCOUNTER — Ambulatory Visit: Payer: BLUE CROSS/BLUE SHIELD | Admitting: Pulmonary Disease

## 2015-08-23 ENCOUNTER — Ambulatory Visit (INDEPENDENT_AMBULATORY_CARE_PROVIDER_SITE_OTHER): Payer: BLUE CROSS/BLUE SHIELD

## 2015-08-23 ENCOUNTER — Encounter: Payer: Self-pay | Admitting: Podiatry

## 2015-08-23 ENCOUNTER — Ambulatory Visit (INDEPENDENT_AMBULATORY_CARE_PROVIDER_SITE_OTHER): Payer: BLUE CROSS/BLUE SHIELD | Admitting: Podiatry

## 2015-08-23 DIAGNOSIS — M722 Plantar fascial fibromatosis: Secondary | ICD-10-CM

## 2015-08-23 MED ORDER — METHYLPREDNISOLONE 4 MG PO TBPK: ORAL_TABLET | ORAL | 0 refills | Status: DC

## 2015-08-23 MED ORDER — MELOXICAM 15 MG PO TABS: 15.0000 mg | ORAL_TABLET | Freq: Every day | ORAL | 3 refills | Status: DC

## 2015-08-23 NOTE — Patient Instructions (Signed)
Plantar Fasciitis Plantar fasciitis is a painful foot condition that affects the heel. It occurs when the band of tissue that connects the toes to the heel bone (plantar fascia) becomes irritated. This can happen after exercising too much or doing other repetitive activities (overuse injury). The pain from plantar fasciitis can range from mild irritation to severe pain that makes it difficult for you to walk or move. The pain is usually worse in the morning or after you have been sitting or lying down for a while. CAUSES This condition may be caused by:  Standing for long periods of time.  Wearing shoes that do not fit.  Doing high-impact activities, including running, aerobics, and ballet.  Being overweight.  Having an abnormal way of walking (gait).  Having tight calf muscles.  Having high arches in your feet.  Starting a new athletic activity. SYMPTOMS The main symptom of this condition is heel pain. Other symptoms include:  Pain that gets worse after activity or exercise.  Pain that is worse in the morning or after resting.  Pain that goes away after you walk for a few minutes. DIAGNOSIS This condition may be diagnosed based on your signs and symptoms. Your health care provider will also do a physical exam to check for:  A tender area on the bottom of your foot.  A high arch in your foot.  Pain when you move your foot.  Difficulty moving your foot. You may also need to have imaging studies to confirm the diagnosis. These can include:  X-rays.  Ultrasound.  MRI. TREATMENT  Treatment for plantar fasciitis depends on the severity of the condition. Your treatment may include:  Rest, ice, and over-the-counter pain medicines to manage your pain.  Exercises to stretch your calves and your plantar fascia.  A splint that holds your foot in a stretched, upward position while you sleep (night splint).  Physical therapy to relieve symptoms and prevent problems in the  future.  Cortisone injections to relieve severe pain.  Extracorporeal shock wave therapy (ESWT) to stimulate damaged plantar fascia with electrical impulses. It is often used as a last resort before surgery.  Surgery, if other treatments have not worked after 12 months. HOME CARE INSTRUCTIONS  Take medicines only as directed by your health care provider.  Avoid activities that cause pain.  Roll the bottom of your foot over a bag of ice or a bottle of cold water. Do this for 20 minutes, 3-4 times a day.  Perform simple stretches as directed by your health care provider.  Try wearing athletic shoes with air-sole or gel-sole cushions or soft shoe inserts.  Wear a night splint while sleeping, if directed by your health care provider.  Keep all follow-up appointments with your health care provider. PREVENTION   Do not perform exercises or activities that cause heel pain.  Consider finding low-impact activities if you continue to have problems.  Lose weight if you need to. The best way to prevent plantar fasciitis is to avoid the activities that aggravate your plantar fascia. SEEK MEDICAL CARE IF:  Your symptoms do not go away after treatment with home care measures.  Your pain gets worse.  Your pain affects your ability to move or do your daily activities.   This information is not intended to replace advice given to you by your health care provider. Make sure you discuss any questions you have with your health care provider.   Document Released: 10/08/2000 Document Revised: 10/04/2014 Document Reviewed: 11/23/2013 Elsevier   Interactive Patient Education 2016 Elsevier Inc.  

## 2015-08-23 NOTE — Progress Notes (Signed)
   Subjective:    Patient ID: Samuel Macias, male    DOB: 04-04-1955, 60 y.o.   MRN: [id number]  HPI: He presents today with a chief complaint of painful right heel times the past 6 months. He states that had plantar fasciitis before in the slotted feels like. I tried ibuprofen and orthotics but really not wearing the orthotics anymore. He thinks that he could've possibly started this with a bad pair of shoes.      Review of Systems  All other systems reviewed and are negative.      Objective:   Physical Exam: Vital signs are stable alert and oriented 3. Pulses are palpable. Neurologic sensorium is intact. Deep tendon reflexes are intact muscle strength is normal bilateral. Orthopedic evaluation demonstrates pain on palpation medial calcaneal tubercle of the right heel. Radiographic evaluation doesn't straight plantar distally oriented calcaneal heel spur of the soft tissue increase in density at the plantar fascia calcaneal insertion site.      Assessment & Plan:  Plantar fasciitis right 6 months.  Plan: I injected his first dose of Kenalog and local anesthetic to the right heel today put him in a plantar fascia brace and a night splint. Discussed appropriate shoe gear stretching exercises and ice therapy. We discussed the etiology pathology conservative versus surgical therapies. I also started him on a Medrol Dosepak to be followed by meloxicam. I will follow-up with him in 1 month.

## 2015-08-24 ENCOUNTER — Telehealth: Payer: Self-pay | Admitting: *Deleted

## 2015-08-24 MED ORDER — METHYLPREDNISOLONE 4 MG PO TBPK: ORAL_TABLET | ORAL | 0 refills | Status: DC

## 2015-08-24 MED ORDER — MELOXICAM 15 MG PO TABS: 15.0000 mg | ORAL_TABLET | Freq: Every day | ORAL | 3 refills | Status: DC

## 2015-08-24 NOTE — Telephone Encounter (Signed)
Pt states the medications ordered yesterday [facility] [address]. Unable to leave message mobile voicemail box not set up. Left message on home phone that [facility], to begin the Steroid dose pack on a whole day, not to take meloxicam until the dose pack had been completed.

## 2015-08-31 ENCOUNTER — Telehealth: Payer: Self-pay

## 2015-08-31 DIAGNOSIS — E785 Hyperlipidemia, unspecified: Secondary | ICD-10-CM

## 2015-08-31 MED ORDER — ROSUVASTATIN CALCIUM 20 MG PO TABS: 20.0000 mg | ORAL_TABLET | Freq: Every day | ORAL | 1 refills | Status: DC

## 2015-08-31 NOTE — Telephone Encounter (Signed)
Pt would like a refill on his rosuvastatin (CRESTOR) 20 MG tablet [id number] at Pharmacy:  [place], [place] [facility]. Please advise at [phone]

## 2015-08-31 NOTE — Telephone Encounter (Signed)
Rx sent 

## 2015-08-31 NOTE — Telephone Encounter (Signed)
[facility]. I re-sent Rx [facility].

## 2015-09-14 ENCOUNTER — Telehealth: Payer: Self-pay

## 2015-09-14 DIAGNOSIS — E785 Hyperlipidemia, unspecified: Secondary | ICD-10-CM

## 2015-09-14 NOTE — Telephone Encounter (Signed)
Patient is calling because he requested a refill for crestor and [facility] never received anything. Please resend.

## 2015-09-18 MED ORDER — ROSUVASTATIN CALCIUM 20 MG PO TABS: 20.0000 mg | ORAL_TABLET | Freq: Every day | ORAL | 1 refills | Status: DC

## 2015-09-18 NOTE — Telephone Encounter (Signed)
Re-sent Rx that was sent in [place] on 8/4. LMOM on H# to advise. Cell # did not have VM set up.

## 2015-09-20 ENCOUNTER — Ambulatory Visit (INDEPENDENT_AMBULATORY_CARE_PROVIDER_SITE_OTHER): Payer: BLUE CROSS/BLUE SHIELD | Admitting: Podiatry

## 2015-09-20 ENCOUNTER — Encounter: Payer: Self-pay | Admitting: Podiatry

## 2015-09-20 DIAGNOSIS — M722 Plantar fascial fibromatosis: Secondary | ICD-10-CM | POA: Diagnosis not present

## 2015-09-20 NOTE — Progress Notes (Signed)
He presents today for follow-up of plantar fasciitis to the right foot. He states that the brace and shoe gear makes a big difference and on doing much better.  Objective: Vital signs are stable he is alert and oriented 3. Continues to take his anti-inflammatory and wear good shoes. He has minimal pain on palpation medial calcaneal tubercle of the right heel painonpalpationofthelateralaspectoftherightfoot.Noreproduciblepaininthefootatall.  Assessment:Resolvingplantarfasciitis.  Plan:II recommended for him to continue all conservative therapies and I will follow-up with him in 1 month if necessary.

## 2015-10-18 ENCOUNTER — Ambulatory Visit: Payer: BLUE CROSS/BLUE SHIELD | Admitting: Podiatry

## 2015-12-05 ENCOUNTER — Telehealth: Payer: Self-pay

## 2015-12-05 NOTE — Telephone Encounter (Signed)
Patient is requesting a medication refill of toprol-xl.   [phone]

## 2015-12-07 ENCOUNTER — Other Ambulatory Visit: Payer: Self-pay | Admitting: Emergency Medicine

## 2015-12-07 DIAGNOSIS — I10 Essential (primary) hypertension: Secondary | ICD-10-CM

## 2015-12-07 MED ORDER — METOPROLOL SUCCINATE ER 50 MG PO TB24: ORAL_TABLET | ORAL | 3 refills | Status: DC

## 2015-12-07 NOTE — Telephone Encounter (Signed)
[facility]

## 2015-12-10 NOTE — Telephone Encounter (Signed)
Notified pt that RFs were sent and that he is overdue for f/up visit for BP. Pt agreed to CB and set up appt.

## 2016-03-06 ENCOUNTER — Telehealth: Payer: Self-pay

## 2016-03-06 DIAGNOSIS — I10 Essential (primary) hypertension: Secondary | ICD-10-CM

## 2016-03-06 MED ORDER — METOPROLOL SUCCINATE ER 50 MG PO TB24: ORAL_TABLET | ORAL | 0 refills | Status: DC

## 2016-03-06 NOTE — Telephone Encounter (Signed)
Pt is calling to get a refill of his toprol 50mg  tablet.  He states that [facility] has [facility] to [facility] and now has [employer].  Please advise if there are any problems [phone]

## 2016-03-07 ENCOUNTER — Telehealth: Payer: Self-pay

## 2016-03-17 ENCOUNTER — Ambulatory Visit (INDEPENDENT_AMBULATORY_CARE_PROVIDER_SITE_OTHER): Payer: 59 | Admitting: Family Medicine

## 2016-03-17 VITALS — BP 110/60 | HR 61 | Temp 98.7°F | Resp 16 | Ht 65.75 in | Wt 166.0 lb

## 2016-03-17 DIAGNOSIS — I10 Essential (primary) hypertension: Secondary | ICD-10-CM

## 2016-03-17 DIAGNOSIS — R001 Bradycardia, unspecified: Secondary | ICD-10-CM | POA: Diagnosis not present

## 2016-03-17 DIAGNOSIS — R42 Dizziness and giddiness: Secondary | ICD-10-CM

## 2016-03-17 MED ORDER — METOPROLOL SUCCINATE ER 25 MG PO TB24: 25.0000 mg | ORAL_TABLET | Freq: Every day | ORAL | 2 refills | Status: DC

## 2016-03-17 NOTE — Progress Notes (Signed)
Subjective:    Patient ID: Samuel Macias, male    DOB: Aug 15, 1955, 61 y.o.   MRN: [id number]  Chief Complaint  Patient presents with  . Dizziness    X2 weeks off/on; thinks he meds need adjusting    PCP: Wendie Agreste, MD  HPI  This is a 61 y.o. male who is presenting with dizziness. He reports that his symptoms started about four weeks ago. He has had them occur about 4 times. He will be walking and notice that he feels unsteady and has to stop. This does not occur while rising from a seated position. He takes both of his blood pressure medications in the morning. He had symptoms occur on Saturday and was unable to drive and they lasted longer than an hour. He denies any one side weakness or slurring of his speech. He denies any history of heart problems in the past. He denies any syncope. .   Review of Systems  ROS: No unexpected weight loss, fever, chills, swelling, instability, muscle pain, numbness/tingling, redness, otherwise see HPI   Surgical: wrist surgery  Social: former smoker and smokeless tobacco.  Fhx: none    Patient Active Problem List   Diagnosis Date Noted  . Dizziness 03/17/2016  . Cough 04/26/2015  . GERD (gastroesophageal reflux disease) 04/26/2015  . Laryngopharyngeal reflux (LPR) 04/26/2015  . Hypertension 03/01/2011  . Hyperlipidemia 03/01/2011  . Personal history of colonic polyps - adenomas 11/05/2006     Prior to Admission medications   Medication Sig Start Date End Date Taking? Authorizing Provider  aspirin 81 MG tablet Take 81 mg by mouth daily.   Yes Historical Provider, MD  losartan (COZAAR) 100 MG tablet Take 1 tablet (100 mg total) by mouth daily. 04/26/15  Yes Noralee Space, MD  meloxicam (MOBIC) 15 MG tablet Take 1 tablet (15 mg total) by mouth daily. 08/24/15  Yes Max T Hyatt, DPM  metoprolol succinate (TOPROL-XL) 50 MG 24 hr tablet TAKE 1 TABLET BY MOUTH EVERY DAY 03/06/16  Yes Wendie Agreste, MD  Omega-3 Fatty Acids (FISH  OIL) 1200 MG CAPS Take 1 capsule by mouth daily.   Yes Historical Provider, MD  rosuvastatin (CRESTOR) 20 MG tablet Take 1 tablet (20 mg total) by mouth daily. 09/18/15  Yes Wendie Agreste, MD  VIAGRA 100 MG tablet  06/08/15  Yes Historical Provider, MD     No Known Allergies    Objective:   Physical Exam BP 110/60 (BP Location: Right Arm, Patient Position: Sitting, Cuff Size: Normal)   Pulse 61   Temp 98.7 F (37.1 C) (Oral)   Resp 16   Ht 5' 5.75" (1.67 m)   Wt 166 lb (75.3 kg)   SpO2 98%   BMI 27.00 kg/m  Gen: NAD, alert, cooperative with exam, well-appearing CV: bradycardic, regular rhythm, good S1/S2, no murmur, no edema, capillary refill brisk  Resp: CTABL, no wheezes, non-labored Skin: no rashes, normal turgor  Neuro: no gross deficits.  Psych:  alert and oriented  Orthostatic VS for the past 24 hrs:  BP- Lying Pulse- Lying BP- Sitting Pulse- Sitting BP- Standing at 0 minutes Pulse- Standing at 0 minutes  03/17/16 1206 142/84 (!) 48 150/84 (!) 46 149/81 (!) 46        Assessment & Plan:   Dizziness There is concern that his symptoms could be presyncope. They seem to be progressing and they lasted longer than an hour this past weekend. He denies any weakness that  is one sided. He does take an ASA daily. His HR is bradycardic today which is most likely the source of his symptoms..  - BMP, CBC, EKG  - advised to take losartan in the morning and metoprolol at night. Will  Decrease metoprolol to 25 mg. - will refer to cardiology.  - given indications to seek immediate care and to return.   Hypertension Decreased metoprolol and continued with losartan at current dose.  May need to continue to decrease metoprolol if still bradycardic.  Patient was counseled reviewing diagnosis and treatment in detail, totaling in 40 minutes, over half of which was spent in face to face counseling.

## 2016-03-17 NOTE — Assessment & Plan Note (Signed)
Decreased metoprolol and continued with losartan at current dose.  May need to continue to decrease metoprolol if still bradycardic.

## 2016-03-17 NOTE — Patient Instructions (Addendum)
  Thank you for coming in,   I am referring you to cardiology for your slower heart rate.   I am also sending in a new prescription for your metoprolol.   Please seek immediate care if your symptoms are one sided or you having slurring of your words.   Please follow up with [place] in 3 to 4 weeks.    Please feel free to call with any questions or concerns at any time, at [phone]. --Dr. Raeford Razor    IF you received an x-ray today, you will receive an invoice from [facility]. Please contact [facility] at [phone] with questions or concerns regarding your invoice.   IF you received labwork today, you will receive an invoice from [place]. Please contact [facility] at [phone] with questions or concerns regarding your invoice.   Our billing staff will not be able to assist you with questions regarding bills from these companies.  You will be contacted with the lab results as soon as they are available. The fastest way to get your results is to activate your My Chart account. Instructions are located on the last page of this paperwork. If you have not heard from [place] regarding the results in 2 weeks, please contact this office.

## 2016-03-17 NOTE — Assessment & Plan Note (Addendum)
There is concern that his symptoms could be presyncope. They seem to be progressing and they lasted longer than an hour this past weekend. He denies any weakness that is one sided. He does take an ASA daily. His HR is bradycardic today which is most likely the source of his symptoms..  - BMP, CBC, EKG  - advised to take losartan in the morning and metoprolol at night. Will  Decrease metoprolol to 25 mg. - will refer to cardiology.  - given indications to seek immediate care and to return.

## 2016-03-18 LAB — BASIC METABOLIC PANEL
BUN/Creatinine Ratio: 12 (ref 10–24)
BUN: 10 mg/dL (ref 8–27)
CALCIUM: 9.5 mg/dL (ref 8.6–10.2)
CO2: 26 mmol/L (ref 18–29)
CREATININE: 0.83 mg/dL (ref 0.76–1.27)
Chloride: 101 mmol/L (ref 96–106)
GFR calc Af Amer: 110 mL/min/{1.73_m2} (ref >59)
GFR calc non Af Amer: 95 mL/min/{1.73_m2} (ref >59)
GLUCOSE: 72 mg/dL (ref 65–99)
Potassium: 4.5 mmol/L (ref 3.5–5.2)
Sodium: 142 mmol/L (ref 134–144)

## 2016-03-18 LAB — TSH: TSH: 1.4 u[IU]/mL (ref 0.450–4.500)

## 2016-03-18 LAB — CBC
HEMATOCRIT: 41.6 % (ref 37.5–51.0)
Hemoglobin: 13.9 g/dL (ref 13.0–17.7)
MCH: 30.3 pg (ref 26.6–33.0)
MCHC: 33.4 g/dL (ref 31.5–35.7)
MCV: 91 fL (ref 79–97)
PLATELETS: 273 10*3/uL (ref 150–379)
RBC: 4.58 x10E6/uL (ref 4.14–5.80)
RDW: 13.3 % (ref 12.3–15.4)
WBC: 6.4 10*3/uL (ref 3.4–10.8)

## 2016-03-18 NOTE — Progress Notes (Signed)
Patient discussed with Dr. Raeford Razor, agree with his plan as outlined in note below.  Signed,   [name], MD [facility] [place].  03/18/16 1:26 PM

## 2016-03-20 ENCOUNTER — Other Ambulatory Visit: Payer: Self-pay

## 2016-03-20 DIAGNOSIS — E785 Hyperlipidemia, unspecified: Secondary | ICD-10-CM

## 2016-03-20 MED ORDER — ROSUVASTATIN CALCIUM 20 MG PO TABS: 20.0000 mg | ORAL_TABLET | Freq: Every day | ORAL | 1 refills | Status: DC

## 2016-03-20 NOTE — Telephone Encounter (Signed)
Pt only needs mail order, it has been sent

## 2016-03-20 NOTE — Telephone Encounter (Signed)
Pt is calling to get a refill of his 20mg  Crestor.  He is currently out of his medication and uses [facility] mail order pharmacy.  Please advise  [phone]

## 2016-03-20 NOTE — Telephone Encounter (Signed)
Meds ordered this encounter  Medications  . rosuvastatin (CRESTOR) 20 MG tablet    Sig: Take 1 tablet (20 mg total) by mouth daily.    Dispense:  90 tablet    Refill:  1    Order Specific Question:   Supervising Provider    Answer:   Brigitte Pulse, EVA N [[id number]]    LM to call if he needs a supply sent to a local pharmacy.

## 2016-04-10 ENCOUNTER — Ambulatory Visit (INDEPENDENT_AMBULATORY_CARE_PROVIDER_SITE_OTHER): Payer: 59 | Admitting: Family Medicine

## 2016-04-10 ENCOUNTER — Encounter: Payer: Self-pay | Admitting: Family Medicine

## 2016-04-10 VITALS — BP 129/84 | HR 53 | Temp 97.7°F | Resp 16 | Ht 65.75 in | Wt 162.6 lb

## 2016-04-10 DIAGNOSIS — R001 Bradycardia, unspecified: Secondary | ICD-10-CM | POA: Diagnosis not present

## 2016-04-10 DIAGNOSIS — E785 Hyperlipidemia, unspecified: Secondary | ICD-10-CM

## 2016-04-10 DIAGNOSIS — R42 Dizziness and giddiness: Secondary | ICD-10-CM | POA: Diagnosis not present

## 2016-04-10 DIAGNOSIS — R7303 Prediabetes: Secondary | ICD-10-CM | POA: Diagnosis not present

## 2016-04-10 DIAGNOSIS — I10 Essential (primary) hypertension: Secondary | ICD-10-CM | POA: Diagnosis not present

## 2016-04-10 MED ORDER — LOSARTAN POTASSIUM 100 MG PO TABS: 100.0000 mg | ORAL_TABLET | Freq: Every day | ORAL | 3 refills | Status: DC

## 2016-04-10 MED ORDER — AMLODIPINE BESYLATE 2.5 MG PO TABS: 2.5000 mg | ORAL_TABLET | Freq: Every day | ORAL | 3 refills | Status: DC

## 2016-04-10 NOTE — Progress Notes (Signed)
By signing my name below, I, Samuel Macias, attest that this documentation has been prepared under the direction and in the presence of Samuel Ray, MD.  Electronically Signed: Verlee Macias, Medical Scribe. 04/10/16. 11:05 AM.  Subjective:    Patient ID: Samuel Macias, male    DOB: Aug 15, 1955, 61 y.o.   MRN: [id number]  HPI Chief Complaint  Patient presents with  . Follow-up    medication check    HPI Comments: SALBADOR Macias is a 61 y.o. male who presents to the [facility] for medication follow-up. Pt is fasting.  HTN: Decreased metoprolol and continued on losartan 100 mg QD. Denies experiencing any negative side effects from his medication. Lab Results  Component Value Date   CREATININE 0.83 03/17/2016   BP Readings from Last 3 Encounters:  04/10/16 129/84  03/17/16 110/60  05/16/15 (!) 140/94   HLD: He takes crestor 20 mg QD. Denies experiencing any negative side effects from crestor. Lab Results  Component Value Date   CHOL 206 (H) 05/16/2015   HDL 60 05/16/2015   LDLCALC 121 05/16/2015   TRIG 127 05/16/2015   CHOLHDL 3.4 05/16/2015   Lab Results  Component Value Date   ALT 29 05/16/2015   AST 26 05/16/2015   ALKPHOS 43 05/16/2015   BILITOT 0.8 05/16/2015   PreDM: Pt has being exercising on the treadmill for a few miles and has lost weight. Denies chest pain with exercise. Lab Results  Component Value Date   HGBA1C 6.4 (H) 05/16/2015   Wt Readings from Last 3 Encounters:  04/10/16 162 lb 9.6 oz (73.8 kg)  03/17/16 166 lb (75.3 kg)  05/16/15 175 lb 3.2 oz (79.5 kg)   Dizziness: See last office visit approx 1 months ago. He was bradycardic at that time. Metoprolol was decreased and was referred to cardiology. Reports his dizziness has improved with a lower dose of metoprolol and he has an appt with cardiology 05/01/16 with [place]. Reports he has dizziness when looking up for a long time and with his previous higher dose of  metoprolol. Denies dizziness when getting up, HAs and palpitations.  Patient Active Problem List   Diagnosis Date Noted  . Dizziness 03/17/2016  . Cough 04/26/2015  . GERD (gastroesophageal reflux disease) 04/26/2015  . Laryngopharyngeal reflux (LPR) 04/26/2015  . Hypertension 03/01/2011  . Hyperlipidemia 03/01/2011  . Personal history of colonic polyps - adenomas 11/05/2006   Past Medical History:  Diagnosis Date  . Hyperlipidemia   . Hypertension   . Personal history of colonic polyps - adenomas 11/05/2006   Past Surgical History:  Procedure Laterality Date  . EXTERNAL EAR SURGERY     as child  . pain block     Herniated disc repair  . WRIST SURGERY     tendon   No Known Allergies Prior to Admission medications   Medication Sig Start Date End Date Taking? Authorizing Provider  aspirin 81 MG tablet Take 81 mg by mouth daily.   Yes Historical Provider, MD  losartan (COZAAR) 100 MG tablet Take 1 tablet (100 mg total) by mouth daily. 04/26/15  Yes Noralee Space, MD  meloxicam (MOBIC) 15 MG tablet Take 1 tablet (15 mg total) by mouth daily. 08/24/15  Yes Max T Hyatt, DPM  metoprolol succinate (TOPROL-XL) 25 MG 24 hr tablet Take 1 tablet (25 mg total) by mouth daily. 03/17/16  Yes Rosemarie Ax, MD  Omega-3 Fatty Acids (FISH OIL) 1200 MG CAPS Take 1  capsule by mouth daily.   Yes Historical Provider, MD  rosuvastatin (CRESTOR) 20 MG tablet Take 1 tablet (20 mg total) by mouth daily. 03/20/16  Yes Chelle Jeffery, PA-C  VIAGRA 100 MG tablet  06/08/15  Yes Historical Provider, MD   Social History   Social History  . Marital status: Married    Spouse name: Central Aguirre  . Number of children: 2  . Years of education: 12th grade   Occupational History  . Delivery Driver for [employer] History Main Topics  . Smoking status: Former Smoker    Packs/day: 0.40    Years: 5.00    Types: Cigarettes    Quit date: 01/27/1981  . Smokeless tobacco: Former Systems developer    Types: Chew    Quit  date: 01/27/1981  . Alcohol use 7.2 oz/week    12 Standard drinks or equivalent per week     Comment: 12 pack weekly  . Drug use: No  . Sexual activity: Yes   Other Topics Concern  . Not on file   Social History Narrative   Lives with his wife and son, [name]. His daughter lives independently in [name], [place].   Education: High School   Exercise: Yes   Review of Systems  Constitutional: Negative for fatigue and unexpected weight change.  Eyes: Negative for visual disturbance.  Respiratory: Negative for cough, chest tightness and shortness of breath.   Cardiovascular: Negative for chest pain, palpitations and leg swelling.  Gastrointestinal: Negative for abdominal pain and blood in stool.  Neurological: Positive for dizziness. Negative for light-headedness and headaches.   Objective:  Physical Exam  Constitutional: He is oriented to person, place, and time. He appears well-developed and well-nourished. No distress.  HENT:  Head: Normocephalic and atraumatic.  Eyes: Conjunctivae and EOM are normal. Pupils are equal, round, and reactive to light. Right eye exhibits no nystagmus. Left eye exhibits no nystagmus.  Neck: Neck supple. No JVD present. Carotid bruit is not present.  Cardiovascular: Regular rhythm and normal heart sounds.  Bradycardia present.  Exam reveals no gallop and no friction rub.   No murmur heard. Pulmonary/Chest: Effort normal and breath sounds normal. No respiratory distress. He has no wheezes. He has no rales.  Abdominal: There is no tenderness.  Musculoskeletal: He exhibits no edema.  Neurological: He is alert and oriented to person, place, and time. He displays a negative Romberg sign.  No pronator drift Finger to nose test nl Heel to toe test nl Non focal exam  Skin: Skin is warm and dry.  Psychiatric: He has a normal mood and affect. His behavior is normal.  Nursing note and vitals reviewed.   Vitals:   04/10/16 1017  BP: 129/84  Pulse: (!) 53  Resp:  16  Temp: 97.7 F (36.5 C)  TempSrc: Oral  SpO2: 97%  Weight: 162 lb 9.6 oz (73.8 kg)  Height: 5' 5.75" (1.67 m)  Body mass index is 26.44 kg/m. Assessment & Plan:   Samuel Macias is a 61 y.o. male Essential hypertension - Plan: losartan (COZAAR) 100 MG tablet, amLODipine (NORVASC) 2.5 MG tablet Dizziness Bradycardia  - Overall controlled, improved dizziness with lower dose of beta blocker, but still bradycardic and occasional dizziness. Appears to be noted with looking up for prolonged time, so vascular steal in differential, but with improvement on lower dose of beta blocker, will stop beta blocker altogether at this point.   -stop toprol, start amlodipine 2.5 mg daily, continue losartan same dose.  -  Follow-up with cardiology is pending. RTC precautions if worsening.  Hyperlipidemia, unspecified hyperlipidemia type - Plan: Hepatic Function Panel, Lipid panel  - Recent BMP reviewed. Check lipid panel, hepatic function panel, continue same dose of Crestor 20 mg daily.  Prediabetes - Plan: Hemoglobin A1c  - Commended on exercise and weight loss. Repeat A1c.  Recheck 6 months for physical  Meds ordered this encounter  Medications  . losartan (COZAAR) 100 MG tablet    Sig: Take 1 tablet (100 mg total) by mouth daily.    Dispense:  90 tablet    Refill:  3  . amLODipine (NORVASC) 2.5 MG tablet    Sig: Take 1 tablet (2.5 mg total) by mouth daily.    Dispense:  90 tablet    Refill:  3   Patient Instructions     Talk to cardiologist about dizziness with looking up as that may be due to a blood vessel issue, and may need to have other testing. Stop metoprolol, start amlodipine 2.5mg  once per day, no other changes for now.   Follow-up in  6 months for a physical, sooner if any new concerns.  [facility]: Address: [address], [place], [place] [address]  Phone: [phone]   IF you received an x-Macias today, you will receive an invoice from [facility].  Please contact [facility] Radiology at [phone] with questions or concerns regarding your invoice.   IF you received labwork today, you will receive an invoice from [place]. Please contact [facility] at [phone] with questions or concerns regarding your invoice.   Our billing staff will not be able to assist you with questions regarding bills from these companies.  You will be contacted with the lab results as soon as they are available. The fastest way to get your results is to activate your My Chart account. Instructions are located on the last page of this paperwork. If you have not heard from [place] regarding the results in 2 weeks, please contact this office.       I personally performed the services described in this documentation, which was scribed in my presence. The recorded information has been reviewed and considered for accuracy and completeness, addended by me as needed, and agree with information above.  Signed,   Samuel Ray, MD [facility] [place].  04/10/16 2:43 PM

## 2016-04-10 NOTE — Patient Instructions (Addendum)
   Talk to cardiologist about dizziness with looking up as that may be due to a blood vessel issue, and may need to have other testing. Stop metoprolol, start amlodipine 2.5mg  once per day, no other changes for now.   Follow-up in  6 months for a physical, sooner if any new concerns.  [facility]: Address: [address], [place], [place] [address]  Phone: [phone]   IF you received an x-ray today, you will receive an invoice from [facility]. Please contact [facility] Radiology at [phone] with questions or concerns regarding your invoice.   IF you received labwork today, you will receive an invoice from [place]. Please contact [facility] at [phone] with questions or concerns regarding your invoice.   Our billing staff will not be able to assist you with questions regarding bills from these companies.  You will be contacted with the lab results as soon as they are available. The fastest way to get your results is to activate your My Chart account. Instructions are located on the last page of this paperwork. If you have not heard from [place] regarding the results in 2 weeks, please contact this office.

## 2016-04-11 LAB — LIPID PANEL
CHOL/HDL RATIO: 3.5 ratio (ref 0.0–5.0)
CHOLESTEROL TOTAL: 166 mg/dL (ref 100–199)
HDL: 48 mg/dL (ref >39)
LDL CALC: 98 mg/dL (ref 0–99)
TRIGLYCERIDES: 99 mg/dL (ref 0–149)
VLDL CHOLESTEROL CAL: 20 mg/dL (ref 5–40)

## 2016-04-11 LAB — HEPATIC FUNCTION PANEL
ALBUMIN: 4.5 g/dL (ref 3.6–4.8)
ALT: 39 IU/L (ref 0–44)
AST: 24 IU/L (ref 0–40)
Alkaline Phosphatase: 50 IU/L (ref 39–117)
BILIRUBIN TOTAL: 0.5 mg/dL (ref 0.0–1.2)
Bilirubin, Direct: 0.13 mg/dL (ref 0.00–0.40)
TOTAL PROTEIN: 7.3 g/dL (ref 6.0–8.5)

## 2016-04-11 LAB — HEMOGLOBIN A1C
ESTIMATED AVERAGE GLUCOSE: 123 mg/dL
Hgb A1c MFr Bld: 5.9 % — ABNORMAL HIGH (ref 4.8–5.6)

## 2016-04-15 ENCOUNTER — Encounter: Payer: Self-pay | Admitting: Emergency Medicine

## 2016-04-15 ENCOUNTER — Telehealth: Payer: Self-pay | Admitting: Family Medicine

## 2016-04-15 NOTE — Telephone Encounter (Signed)
Pt calling about his labs I advised him they was sent in the mail and I gave him message of lab results

## 2016-04-18 ENCOUNTER — Ambulatory Visit: Payer: 59 | Admitting: Internal Medicine

## 2016-04-25 ENCOUNTER — Encounter (INDEPENDENT_AMBULATORY_CARE_PROVIDER_SITE_OTHER): Payer: Self-pay

## 2016-04-25 ENCOUNTER — Ambulatory Visit (INDEPENDENT_AMBULATORY_CARE_PROVIDER_SITE_OTHER): Payer: 59 | Admitting: Internal Medicine

## 2016-04-25 ENCOUNTER — Encounter: Payer: Self-pay | Admitting: Internal Medicine

## 2016-04-25 VITALS — BP 114/66 | HR 64 | Ht 66.0 in | Wt 161.6 lb

## 2016-04-25 DIAGNOSIS — R001 Bradycardia, unspecified: Secondary | ICD-10-CM | POA: Insufficient documentation

## 2016-04-25 DIAGNOSIS — E785 Hyperlipidemia, unspecified: Secondary | ICD-10-CM | POA: Diagnosis not present

## 2016-04-25 DIAGNOSIS — I10 Essential (primary) hypertension: Secondary | ICD-10-CM

## 2016-04-25 NOTE — Patient Instructions (Signed)
Medication Instructions: Your physician recommends that you continue on your current medications as directed. Please refer to the Current Medication list given to you today.   Follow-Up: As needed   If you need a refill on your cardiac medications before your next appointment, please call your pharmacy.   

## 2016-04-25 NOTE — Progress Notes (Signed)
OFFICE CONSULT NOTE  Chief Complaint:  Dizziness, bradycardia  Primary Care Physician: Samuel Agreste, MD  HPI:  Samuel Macias is a 61 y.o. male who is being seen today for the evaluation of dizziness and bradycardia at the request of Samuel Ax, MD. Samuel Macias recently had noticed dizziness. He has had 3 separate episodes including one significant episode of dizziness. Previously he had hypertension and was on metoprolol in addition to losartan. He was seen in the office in February and found to have negative orthostatics however was noted to be bradycardic with heart rates in the mid 40s. Based on that his metoprolol was decreased to 25 mg. He continued to have some dizziness and subsequently his metoprolol was discontinued completely. During this time he actually lost more than 25 pounds intentionally. Based on that weight loss it seems reasonable that he was over treated with beta blocker. He denies any chest pain or worsening shortness of breath. He was exercising as well and had no other complaints. EKG personally reviewed today shows sinus rhythm without any ischemia.  PMHx:  Past Medical History:  Diagnosis Date  . Hyperlipidemia   . Hypertension   . Personal history of colonic polyps - adenomas 11/05/2006    Past Surgical History:  Procedure Laterality Date  . EXTERNAL EAR SURGERY     as child  . pain block     Herniated disc repair  . WRIST SURGERY     tendon    FAMHx:  Family History  Problem Relation Age of Onset  . Crohn's disease Brother   . COPD Mother   . Colon cancer Neg Hx     SOCHx:   reports that he quit smoking about 35 years ago. His smoking use included Cigarettes. He has a 2.00 pack-year smoking history. He quit smokeless tobacco use about 35 years ago. His smokeless tobacco use included Chew. He reports that he drinks about 7.2 oz of alcohol per week . He reports that he does not use drugs.  ALLERGIES:  No Known  Allergies  ROS: Pertinent items noted in HPI and remainder of comprehensive ROS otherwise negative.  HOME MEDS: Current Outpatient Prescriptions on File Prior to Visit  Medication Sig Dispense Refill  . amLODipine (NORVASC) 2.5 MG tablet Take 1 tablet (2.5 mg total) by mouth daily. 90 tablet 3  . aspirin 81 MG tablet Take 81 mg by mouth daily.    Marland Kitchen losartan (COZAAR) 100 MG tablet Take 1 tablet (100 mg total) by mouth daily. 90 tablet 3  . meloxicam (MOBIC) 15 MG tablet Take 1 tablet (15 mg total) by mouth daily. 30 tablet 3  . Omega-3 Fatty Acids (FISH OIL) 1200 MG CAPS Take 1 capsule by mouth daily.    . rosuvastatin (CRESTOR) 20 MG tablet Take 1 tablet (20 mg total) by mouth daily. 90 tablet 1  . VIAGRA 100 MG tablet      No current facility-administered medications on file prior to visit.     LABS/IMAGING: No results found for this or any previous visit (from the past 48 hour(s)). No results found.  WEIGHTS: Wt Readings from Last 3 Encounters:  04/25/16 161 lb 9.6 oz (73.3 kg)  04/10/16 162 lb 9.6 oz (73.8 kg)  03/17/16 166 lb (75.3 kg)    VITALS: BP 114/66   Pulse 64   Ht 5\' 6"  (1.676 m)   Wt 161 lb 9.6 oz (73.3 kg)   BMI 26.08 kg/m   EXAM: General  appearance: alert and no distress Neck: no carotid bruit and no JVD Lungs: clear to auscultation bilaterally Heart: regular rate and rhythm Abdomen: soft, non-tender; bowel sounds normal; no masses,  no organomegaly Extremities: extremities normal, atraumatic, no cyanosis or edema Pulses: 2+ and symmetric Skin: Skin color, texture, turgor normal. No rashes or lesions Neurologic: Grossly normal Psych: Pleasant  EKG: Normal sinus rhythm at 64  ASSESSMENT: 1. Symptomatic bradycardia 2. Hypertension-controlled 3. Dyslipidemia  PLAN: 1.   Samuel Macias had a symptomatic bradycardia related to high-dose metoprolol. This is likely worsened by his rapid weight loss and he was noted to be bradycardic and dizzy. He has  had no anginal symptoms and was actively exercising at the time. I believe the medication dose was too much for his weight at the time. Subsequently the beta blockers been discontinued and he has had no further symptoms. I do not feel he needs additional cardiac workup although continued risk factor modification is warranted given his family history of unexplained death in his father in his 23s which is likely related to arrhythmia.  Thanks for the consultation. Follow-up as needed.  Samuel Casino, MD, [facility] Attending Cardiologist Samuel Macias 04/25/2016, 8:34 AM

## 2016-05-29 ENCOUNTER — Other Ambulatory Visit: Payer: Self-pay | Admitting: Family Medicine

## 2016-05-29 DIAGNOSIS — I10 Essential (primary) hypertension: Secondary | ICD-10-CM

## 2016-09-18 ENCOUNTER — Other Ambulatory Visit: Payer: Self-pay | Admitting: Physician Assistant

## 2016-09-18 DIAGNOSIS — E785 Hyperlipidemia, unspecified: Secondary | ICD-10-CM

## 2016-10-12 DIAGNOSIS — Z23 Encounter for immunization: Secondary | ICD-10-CM | POA: Diagnosis not present

## 2016-10-16 ENCOUNTER — Encounter: Payer: 59 | Admitting: Family Medicine

## 2016-10-27 ENCOUNTER — Encounter: Payer: 59 | Admitting: Family Medicine

## 2016-10-27 NOTE — Progress Notes (Signed)
This encounter was created in error - please disregard.

## 2016-10-30 ENCOUNTER — Encounter: Payer: Self-pay | Admitting: Family Medicine

## 2016-10-30 ENCOUNTER — Encounter: Payer: 59 | Admitting: Family Medicine

## 2016-10-30 ENCOUNTER — Ambulatory Visit (INDEPENDENT_AMBULATORY_CARE_PROVIDER_SITE_OTHER): Payer: 59 | Admitting: Family Medicine

## 2016-10-30 VITALS — BP 132/78 | HR 62 | Temp 98.3°F | Resp 16 | Ht 66.0 in | Wt 164.2 lb

## 2016-10-30 DIAGNOSIS — I10 Essential (primary) hypertension: Secondary | ICD-10-CM

## 2016-10-30 DIAGNOSIS — E785 Hyperlipidemia, unspecified: Secondary | ICD-10-CM | POA: Diagnosis not present

## 2016-10-30 DIAGNOSIS — R7303 Prediabetes: Secondary | ICD-10-CM | POA: Diagnosis not present

## 2016-10-30 MED ORDER — LOSARTAN POTASSIUM 100 MG PO TABS
100.0000 mg | ORAL_TABLET | Freq: Every day | ORAL | 3 refills | Status: DC
Start: 2016-10-30 — End: 2017-10-23

## 2016-10-30 MED ORDER — AMLODIPINE BESYLATE 2.5 MG PO TABS: 2.5000 mg | ORAL_TABLET | Freq: Every day | ORAL | 3 refills | Status: DC

## 2016-10-30 MED ORDER — ROSUVASTATIN CALCIUM 20 MG PO TABS: 20.0000 mg | ORAL_TABLET | Freq: Every day | ORAL | 3 refills | Status: DC

## 2016-10-30 NOTE — Progress Notes (Signed)
Subjective:  By signing my name below, I, Moises Blood, attest that this documentation has been prepared under the direction and in the presence of [name], MD. Electronically Signed: Moises Blood, Ellis. 10/30/2016 , 3:36 PM .  Patient was seen in Room 10 .   Patient ID: Samuel Macias, male    DOB: 07-21-1955, 61 y.o.   MRN: [id number] Chief Complaint  Patient presents with  . Medication Refill    crestor   HPI Samuel Macias is a 61 y.o. male Here for follow up. Patient was last seen in March. His last meal was yesterday and is fasting today. He's been working part time with [employer]. He used to work with [employer] and retired in 2015. His wife retired in February.   HTN His BP was 129/84 at last visit in March. He was started on Norvasc 2.5mg  QD. His toprol was stopped and was continued on losartan 100mg  QD. He was seen by cardiologist, Dr. Debara Pickett, on March 30th. His symptoms were improved on beta blocker, and did not need any further work up. He denies knowledge of family history of heart disease. He denies any new dizzy spells. He denies chest pain, shortness of breath, or lightheadedness.   Hyperlipidemia He takes Crestor 20mg  QD. He denies any side effects with his medication.   Lab Results  Component Value Date   CHOL 166 04/10/2016   HDL 48 04/10/2016   LDLCALC 98 04/10/2016   TRIG 99 04/10/2016   CHOLHDL 3.5 04/10/2016   Lab Results  Component Value Date   ALT 39 04/10/2016   AST 24 04/10/2016   ALKPHOS 50 04/10/2016   BILITOT 0.5 04/10/2016    Pre-diabetes Lab Results  Component Value Date   HGBA1C 5.9 (H) 04/10/2016   HGBA1C 6.4 (H) 05/16/2015   His A1C was decreased from 6.4 to 5.9 with weight loss and exercise.   Patient Active Problem List   Diagnosis Date Noted  . Symptomatic bradycardia 04/25/2016  . Dizziness 03/17/2016  . Cough 04/26/2015  . GERD (gastroesophageal reflux disease) 04/26/2015  . Laryngopharyngeal reflux (LPR) 04/26/2015    . Hypertension 03/01/2011  . Hyperlipidemia 03/01/2011  . Personal history of colonic polyps - adenomas 11/05/2006   Past Medical History:  Diagnosis Date  . Hyperlipidemia   . Hypertension   . Personal history of colonic polyps - adenomas 11/05/2006   Past Surgical History:  Procedure Laterality Date  . EXTERNAL EAR SURGERY     as child  . pain block     Herniated disc repair  . WRIST SURGERY     tendon   No Known Allergies Prior to Admission medications   Medication Sig Start Date End Date Taking? Authorizing Provider  amLODipine (NORVASC) 2.5 MG tablet Take 1 tablet (2.5 mg total) by mouth daily. 04/10/16   Wendie Agreste, MD  aspirin 81 MG tablet Take 81 mg by mouth daily.    [provider]  losartan (COZAAR) 100 MG tablet Take 1 tablet (100 mg total) by mouth daily. 04/10/16   Wendie Agreste, MD  meloxicam (MOBIC) 15 MG tablet Take 1 tablet (15 mg total) by mouth daily. 08/24/15   Hyatt, Max T, DPM  Omega-3 Fatty Acids (FISH OIL) 1200 MG CAPS Take 1 capsule by mouth daily.    [provider]  rosuvastatin (CRESTOR) 20 MG tablet TAKE 1 TABLET BY MOUTH  DAILY 09/19/16   Wendie Agreste, MD  VIAGRA 100 MG tablet  06/08/15  [provider]   Social History   Social History  . Marital status: Married    Spouse name: [place]  . Number of children: 2  . Years of education: 12th grade   Occupational History  . 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 Smoking status: Former Smoker    Packs/day: 0.40    Years: 5.00    Types: Cigarettes    Quit date: 01/27/1981  . Smokeless tobacco: Former Systems developer    Types: Chew    Quit date: 01/27/1981  . Alcohol use 7.2 oz/week    12 Standard drinks or equivalent per week     Comment: 12 pack weekly  . Drug use: No  . Sexual activity: Yes   Other Topics Concern  . Not on file   Social History Narrative   Lives with his wife and son, [name]. His daughter lives independently in  [place], [place].   Education: High School   Exercise: Yes   Review of Systems  Constitutional: Negative for fatigue and unexpected weight change.  Eyes: Negative for visual disturbance.  Respiratory: Negative for cough, chest tightness and shortness of breath.   Cardiovascular: Negative for chest pain, palpitations and leg swelling.  Gastrointestinal: Negative for abdominal pain and blood in stool.  Neurological: Negative for dizziness, light-headedness and headaches.       Objective:   Physical Exam  Constitutional: He is oriented to person, place, and time. He appears well-developed and well-nourished.  HENT:  Head: Normocephalic and atraumatic.  Eyes: Pupils are equal, round, and reactive to light. EOM are normal.  Neck: No JVD present. Carotid bruit is not present.  Cardiovascular: Normal rate, regular rhythm and normal heart sounds.   No murmur heard. Pulmonary/Chest: Effort normal and breath sounds normal. He has no rales.  Musculoskeletal: He exhibits no edema.  Neurological: He is alert and oriented to person, place, and time.  Skin: Skin is warm and dry.  Psychiatric: He has a normal mood and affect.  Vitals reviewed.   Vitals:   10/30/16 1516  BP: 132/78  Pulse: 62  Resp: 16  Temp: 98.3 F (36.8 C)  SpO2: 99%  Weight: 164 lb 3.2 oz (74.5 kg)  Height: 5\' 6"  (1.676 m)      Assessment & Plan:   Samuel Macias is a 61 y.o. male Hyperlipidemia, unspecified hyperlipidemia type - Plan: Lipid panel, rosuvastatin (CRESTOR) 20 MG tablet  - tolerating Crestor - labs pending, no changes for now.   Essential hypertension - Plan: Comprehensive metabolic panel, amLODipine (NORVASC) 2.5 MG tablet, losartan (COZAAR) 100 MG tablet  - stable and doing better off betablocker. Labs pending, no changes.   Prediabetes - Plan: Hemoglobin A1c  - continue exercise and watching weight.   Meds ordered this encounter  Medications  . amLODipine (NORVASC) 2.5 MG tablet    Sig:  Take 1 tablet (2.5 mg total) by mouth daily.    Dispense:  90 tablet    Refill:  3  . losartan (COZAAR) 100 MG tablet    Sig: Take 1 tablet (100 mg total) by mouth daily.    Dispense:  90 tablet    Refill:  3  . rosuvastatin (CRESTOR) 20 MG tablet    Sig: Take 1 tablet (20 mg total) by mouth daily.    Dispense:  90 tablet    Refill:  3   Patient Instructions   No change in meds for now, keep appointment for  physical.  Thanks for coming in today.    IF you received an x-ray today, you will receive an invoice from [facility]. Please contact [facility] at [phone] with questions or concerns regarding your invoice.   IF you received labwork today, you will receive an invoice from [place]. Please contact [facility] at [phone] with questions or concerns regarding your invoice.   Our billing staff will not be able to assist you with questions regarding bills from these companies.  You will be contacted with the lab results as soon as they are available. The fastest way to get your results is to activate your My Chart account. Instructions are located on the last page of this paperwork. If you have not heard from [place] regarding the results in 2 weeks, please contact this office.        I personally performed the services described in this documentation, which was scribed in my presence. The recorded information has been reviewed and considered for accuracy and completeness, addended by me as needed, and agree with information above.  Signed,   [name], MD Primary Care at [facility].  11/01/16 11:10 PM

## 2016-10-30 NOTE — Patient Instructions (Signed)
No change in meds for now, keep appointment for physical.  Thanks for coming in today.    IF you received an x-ray today, you will receive an invoice from [facility]. Please contact [facility] at [phone] with questions or concerns regarding your invoice.   IF you received labwork today, you will receive an invoice from [place]. Please contact [facility] at [phone] with questions or concerns regarding your invoice.   Our billing staff will not be able to assist you with questions regarding bills from these companies.  You will be contacted with the lab results as soon as they are available. The fastest way to get your results is to activate your My Chart account. Instructions are located on the last page of this paperwork. If you have not heard from [place] regarding the results in 2 weeks, please contact this office.

## 2016-10-31 LAB — COMPREHENSIVE METABOLIC PANEL
A/G RATIO: 1.8 (ref 1.2–2.2)
ALT: 31 IU/L (ref 0–44)
AST: 29 IU/L (ref 0–40)
Albumin: 4.4 g/dL (ref 3.6–4.8)
Alkaline Phosphatase: 44 IU/L (ref 39–117)
BILIRUBIN TOTAL: 0.5 mg/dL (ref 0.0–1.2)
BUN/Creatinine Ratio: 10 (ref 10–24)
BUN: 8 mg/dL (ref 8–27)
CHLORIDE: 99 mmol/L (ref 96–106)
CO2: 26 mmol/L (ref 20–29)
Calcium: 10 mg/dL (ref 8.6–10.2)
Creatinine, Ser: 0.8 mg/dL (ref 0.76–1.27)
GFR calc Af Amer: 111 mL/min/{1.73_m2} (ref >59)
GFR calc non Af Amer: 96 mL/min/{1.73_m2} (ref >59)
GLUCOSE: 91 mg/dL (ref 65–99)
Globulin, Total: 2.4 g/dL (ref 1.5–4.5)
POTASSIUM: 4.8 mmol/L (ref 3.5–5.2)
Sodium: 142 mmol/L (ref 134–144)
Total Protein: 6.8 g/dL (ref 6.0–8.5)

## 2016-10-31 LAB — HEMOGLOBIN A1C
Est. average glucose Bld gHb Est-mCnc: 117 mg/dL
Hgb A1c MFr Bld: 5.7 % — ABNORMAL HIGH (ref 4.8–5.6)

## 2016-10-31 LAB — LIPID PANEL
CHOLESTEROL TOTAL: 146 mg/dL (ref 100–199)
Chol/HDL Ratio: 2.3 ratio (ref 0.0–5.0)
HDL: 64 mg/dL (ref >39)
LDL Calculated: 70 mg/dL (ref 0–99)
TRIGLYCERIDES: 58 mg/dL (ref 0–149)
VLDL Cholesterol Cal: 12 mg/dL (ref 5–40)

## 2016-11-12 ENCOUNTER — Encounter: Payer: Self-pay | Admitting: Radiology

## 2016-11-25 ENCOUNTER — Encounter: Payer: Self-pay | Admitting: Family Medicine

## 2016-11-25 ENCOUNTER — Ambulatory Visit (INDEPENDENT_AMBULATORY_CARE_PROVIDER_SITE_OTHER): Payer: 59 | Admitting: Family Medicine

## 2016-11-25 VITALS — BP 116/70 | HR 60 | Temp 98.1°F | Resp 16 | Ht 65.75 in | Wt 162.0 lb

## 2016-11-25 DIAGNOSIS — N529 Male erectile dysfunction, unspecified: Secondary | ICD-10-CM

## 2016-11-25 DIAGNOSIS — Z23 Encounter for immunization: Secondary | ICD-10-CM

## 2016-11-25 DIAGNOSIS — Z0000 Encounter for general adult medical examination without abnormal findings: Secondary | ICD-10-CM

## 2016-11-25 DIAGNOSIS — Z125 Encounter for screening for malignant neoplasm of prostate: Secondary | ICD-10-CM | POA: Diagnosis not present

## 2016-11-25 MED ORDER — ZOSTER VAC RECOMB ADJUVANTED 50 MCG/0.5ML IM SUSR: 0.5000 mL | Freq: Once | INTRAMUSCULAR | 1 refills | Status: AC

## 2016-11-25 MED ORDER — VIAGRA 100 MG PO TABS: 50.0000 mg | ORAL_TABLET | Freq: Every day | ORAL | 3 refills | Status: DC | PRN

## 2016-11-25 NOTE — Patient Instructions (Addendum)
Keeping you healthy  Get these tests  Blood pressure- Have your blood pressure checked once a year by your healthcare provider.  Normal blood pressure is 120/80  Weight- Have your body mass index (BMI) calculated to screen for obesity.  BMI is a measure of body fat based on height and weight. You can also calculate your own BMI at [email or web address].com/bmi/.  Cholesterol- Have your cholesterol checked every year.  Diabetes- Have your blood sugar checked regularly if you have high blood pressure, high cholesterol, have a family history of diabetes or if you are overweight.  Screening for Colon Cancer- Colonoscopy starting at age 50.  Screening may begin sooner depending on your family history and other health conditions. Follow up colonoscopy as directed by your Gastroenterologist.  Screening for Prostate Cancer- Both blood work (PSA) and a rectal exam help screen for Prostate Cancer.  Screening begins at age 40 with African-American men and at age 50 with Caucasian men.  Screening may begin sooner depending on your family history.  Take these medicines  Aspirin- One aspirin daily can help prevent Heart disease and Stroke.  Flu shot- Every fall.  Tetanus- Every 10 years.  Zostavax- Once after the age of 60 to prevent Shingles.  Pneumonia shot- Once after the age of 65; if you are younger than 65, ask your healthcare provider if you need a Pneumonia shot.  Take these steps  Don't smoke- If you do smoke, talk to your doctor about quitting.  For tips on how to quit, go to www.smokefree.gov or call 1-800-QUIT-NOW.  Be physically active- Exercise 5 days a week for at least 30 minutes.  If you are not already physically active start slow and gradually work up to 30 minutes of moderate physical activity.  Examples of moderate activity include walking briskly, mowing the yard, dancing, swimming, bicycling, etc.  Eat a healthy diet- Eat a variety of healthy food such as fruits, vegetables,  low fat milk, low fat cheese, yogurt, lean meant, poultry, fish, beans, tofu, etc. For more information go to www.thenutritionsource.org  Drink alcohol in moderation- Limit alcohol intake to less than two drinks a day. Never drink and drive.  Dentist- Brush and floss twice daily; visit your dentist twice a year.  Depression- Your emotional health is as important as your physical health. If you're feeling down, or losing interest in things you would normally enjoy please talk to your healthcare provider.  Eye exam- Visit your eye doctor every year.  Safe sex- If you may be exposed to a sexually transmitted infection, use a condom.  Seat belts- Seat belts can save your life; always wear one.  Smoke/Carbon Monoxide detectors- These detectors need to be installed on the appropriate level of your home.  Replace batteries at least once a year.  Skin cancer- When out in the sun, cover up and use sunscreen 15 SPF or higher.  Violence- If anyone is threatening you, please tell your healthcare provider.  Living Will/ Health care power of attorney- Speak with your healthcare provider and family.   IF you received an x-ray today, you will receive an invoice from [facility]. Please contact [facility] at [phone] with questions or concerns regarding your invoice.   IF you received labwork today, you will receive an invoice from [facility]. Please contact [facility] at [phone] with questions or concerns regarding your invoice.   Our billing staff will not be able to assist you with questions regarding bills from these companies.  You will be contacted   with the lab results as soon as they are available. The fastest way to get your results is to activate your My Chart account. Instructions are located on the last page of this paperwork. If you have not heard from us regarding the results in 2 weeks, please contact this office.     

## 2016-11-25 NOTE — Progress Notes (Signed)
Subjective:  By signing my name below, I, Essence Howell, attest that this documentation has been prepared under the direction and in the presence of Wendie Agreste, MD Electronically Signed: Ladene Artist, ED Scribe 11/25/2016 at 10:27 AM.   Patient ID: Samuel Macias, male    DOB: [birth year], 61 y.o.   MRN: [id number]  Chief Complaint  Patient presents with  . Annual Exam   HPI Samuel Macias is a 61 y.o. male who presents to Primary Care at [facility] for an annual exam. H/o HTN, hyperlipidemia, GERD and Pre-DM. Specific medical problems were discussed a few weeks ago. A1C improved to 5.7. Other labs including lipids and electrolytes looked okay.   Pt reports being 184 lbs at his heaviest in November 2017. States he has stopped drinking beer completely and walks 2 miles/day on his days off.  Wt Readings from Last 3 Encounters:  11/25/16 162 lb (73.5 kg)  10/30/16 164 lb 3.2 oz (74.5 kg)  04/25/16 161 lb 9.6 oz (73.3 kg)   CA Screening Colonoscopy: July 2015 by Dr. Carlean Purl, single polyp which was a 2 mm adenoma. Repeat in 2022. Prostate CA Screening: pt agrees to DRE at this visit. Lab Results  Component Value Date   PSA 0.40 05/16/2015   PSA 0.31 02/12/2013   PSA 0.29 03/20/2012  Skin CA Screening: seen by [facility] x 3 years. Has had a few basal cells removed.   Immunizations Immunization History  Administered Date(s) Administered  . Influenza,inj,Quad PF,6+ Mos 10/23/2014  . Influenza-Unspecified 11/13/2015, 10/12/2016  . Tdap 09/30/2006  Shingles: has not had  Depression Screening Depression screen [facility] 2/9 11/25/2016 10/30/2016 04/10/2016 03/17/2016 05/16/2015  Decreased Interest 0 0 0 0 0  Down, Depressed, Hopeless 0 0 0 0 0  PHQ - 2 Score 0 0 0 0 0    Visual Acuity Screening   Right eye Left eye Both eyes  Without correction: 20/25 20/40 20/25   With correction:      Vision: followed annually; wears contact lens Dentist: followed regularly by  [facility] Exercise: walks 2 miles on his days off   Patient Active Problem List   Diagnosis Date Noted  . Symptomatic bradycardia 04/25/2016  . Dizziness 03/17/2016  . Cough 04/26/2015  . GERD (gastroesophageal reflux disease) 04/26/2015  . Laryngopharyngeal reflux (LPR) 04/26/2015  . Hypertension 03/01/2011  . Hyperlipidemia 03/01/2011  . Personal history of colonic polyps - adenomas 11/05/2006   Past Medical History:  Diagnosis Date  . Hyperlipidemia   . Hypertension   . Personal history of colonic polyps - adenomas 11/05/2006   Past Surgical History:  Procedure Laterality Date  . EXTERNAL EAR SURGERY     as child  . pain block     Herniated disc repair  . WRIST SURGERY     tendon   No Known Allergies Prior to Admission medications   Medication Sig Start Date End Date Taking? Authorizing Provider  amLODipine (NORVASC) 2.5 MG tablet Take 1 tablet (2.5 mg total) by mouth daily. 10/30/16  Yes Wendie Agreste, MD  aspirin 81 MG tablet Take 81 mg by mouth daily.   Yes [provider]  losartan (COZAAR) 100 MG tablet Take 1 tablet (100 mg total) by mouth daily. 10/30/16  Yes Wendie Agreste, MD  meloxicam (MOBIC) 15 MG tablet Take 1 tablet (15 mg total) by mouth daily. 08/24/15  Yes Hyatt, Max T, DPM  Omega-3 Fatty Acids (FISH OIL) 1200 MG CAPS Take 1 capsule by  mouth daily.   Yes [provider]  rosuvastatin (CRESTOR) 20 MG tablet Take 1 tablet (20 mg total) by mouth daily. 10/30/16  Yes Wendie Agreste, MD  VIAGRA 100 MG tablet  06/08/15  Yes [provider]   Social History   Social History  . Marital status: Married    Spouse name: [place]  . Number of children: 2  . Years of education: 12th grade   Occupational History  . 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 Smoking status: Former Smoker    Packs/day: 0.40    Years: 5.00    Types: Cigarettes    Quit date: 01/27/1981  . Smokeless tobacco: Former Systems developer     Types: Chew    Quit date: 01/27/1981  . Alcohol use 7.2 oz/week    12 Standard drinks or equivalent per week     Comment: 12 pack weekly  . Drug use: No  . Sexual activity: Yes   Other Topics Concern  . Not on file   Social History Narrative   Lives with his wife and son, [name]. His daughter lives independently in [place], [place].   Education: [employer]   Exercise: Yes   Review of Systems   13 point review of systems per patient health survey noted.  Negative other than as indicated above.     Objective:   Physical Exam  Constitutional: He is oriented to person, place, and time. He appears well-developed and well-nourished.  HENT:  Head: Normocephalic and atraumatic.  Right Ear: External ear normal.  Left Ear: External ear normal.  Mouth/Throat: Oropharynx is clear and moist.  Eyes: Pupils are equal, round, and reactive to light. Conjunctivae and EOM are normal.  Neck: Normal range of motion. Neck supple. No thyromegaly present.  Cardiovascular: Normal rate, regular rhythm, normal heart sounds and intact distal pulses.   Pulmonary/Chest: Effort normal and breath sounds normal. No respiratory distress. He has no wheezes.  Abdominal: Soft. He exhibits no distension. There is no tenderness. Hernia confirmed negative in the right inguinal area and confirmed negative in the left inguinal area.  Genitourinary: Prostate normal.  Musculoskeletal: Normal range of motion. He exhibits no edema or tenderness.  Lymphadenopathy:    He has no cervical adenopathy.  Neurological: He is alert and oriented to person, place, and time. He has normal reflexes.  Skin: Skin is warm and dry.  Psychiatric: He has a normal mood and affect. His behavior is normal.  Vitals reviewed.   Vitals:   11/25/16 1007  BP: 116/70  Pulse: 60  Resp: 16  Temp: 98.1 F (36.7 C)  TempSrc: Oral  SpO2: 98%  Weight: 162 lb (73.5 kg)  Height: 5' 5.75" (1.67 m)      Assessment & Plan:   Samuel Macias is a  61 y.o. male Annual physical exam  - -anticipatory guidance as below in AVS, screening labs above. Health maintenance items as above in HPI discussed/recommended as applicable.   Need for Tdap vaccination - Plan: Tdap vaccine greater than or equal to 7yo IM  Prostate cancer screening - Plan: PSA  - We discussed pros and cons of prostate cancer screening, and after this discussion, he chose to have screening done. PSA obtained, and no concerning findings on DRE.   Need for shingles vaccine - Plan: Zoster Vaccine Adjuvanted [facility]) injection  - printed to check at pharmacy for cost.   Erectile dysfunction, unspecified erectile dysfunction type  -  viagra Rx given - use lowest effective dose. Side effects discussed (including but not limited to headache/flushing, blue discoloration of vision, possible vascular steal and risk of cardiac effects if underlying unknown coronary artery disease, and permanent sensorineural hearing loss). Understanding expressed.   Meds ordered this encounter  Medications  . Zoster Vaccine Adjuvanted [facility]) injection    Sig: Inject 0.5 mLs into the muscle once. Repeat in 2-6 months.    Dispense:  0.5 mL    Refill:  1  . VIAGRA 100 MG tablet    Sig: Take 0.5-1 tablets (50-100 mg total) by mouth daily as needed for erectile dysfunction.    Dispense:  5 tablet    Refill:  3   Patient Instructions    Keeping you healthy  Get these tests  Blood pressure- Have your blood pressure checked once a year by your healthcare provider.  Normal blood pressure is 120/80  Weight- Have your body mass index (BMI) calculated to screen for obesity.  BMI is a measure of body fat based on height and weight. You can also calculate your own BMI at ViewBanking.si.  Cholesterol- Have your cholesterol checked every year.  Diabetes- Have your blood sugar checked regularly if you have high blood pressure, high cholesterol, have a family history of diabetes or if you  are overweight.  Screening for Colon Cancer- Colonoscopy starting at age 7.  Screening may begin sooner depending on your family history and other health conditions. Follow up colonoscopy as directed by your Gastroenterologist.  Screening for Prostate Cancer- Both blood work (PSA) and a rectal exam help screen for Prostate Cancer.  Screening begins at age 47 with African-American men and at age 40 with Caucasian men.  Screening may begin sooner depending on your family history.  Take these medicines  Aspirin- One aspirin daily can help prevent Heart disease and Stroke.  Flu shot- Every fall.  Tetanus- Every 10 years.  Zostavax- Once after the age of 80 to prevent Shingles.  Pneumonia shot- Once after the age of 4; if you are younger than 34, ask your healthcare provider if you need a Pneumonia shot.  Take these steps  Don't smoke- If you do smoke, talk to your doctor about quitting.  For tips on how to quit, go to www.smokefree.gov or call 1-800-QUIT-NOW.  Be physically active- Exercise 5 days a week for at least 30 minutes.  If you are not already physically active start slow and gradually work up to 30 minutes of moderate physical activity.  Examples of moderate activity include walking briskly, mowing the yard, dancing, swimming, bicycling, etc.  Eat a healthy diet- Eat a variety of healthy food such as fruits, vegetables, low fat milk, low fat cheese, yogurt, lean meant, poultry, fish, beans, tofu, etc. For more information go to www.thenutritionsource.org  Drink alcohol in moderation- Limit alcohol intake to less than two drinks a day. Never drink and drive.  Dentist- Brush and floss twice daily; visit your dentist twice a year.  Depression- Your emotional health is as important as your physical health. If you're feeling down, or losing interest in things you would normally enjoy please talk to your healthcare provider.  Eye exam- Visit your eye doctor every year.  Safe sex-  If you may be exposed to a sexually transmitted infection, use a condom.  Seat belts- Seat belts can save your life; always wear one.  Smoke/Carbon Monoxide detectors- These detectors need to be installed on the appropriate level of your home.  Replace batteries at least once a year.  Skin cancer- When out in the sun, cover up and use sunscreen 15 SPF or higher.  Violence- If anyone is threatening you, please tell your healthcare provider.  Living Will/ Health care power of attorney- Speak with your healthcare provider and family.    IF you received an x-ray today, you will receive an invoice from [facility]. Please contact [facility] at [phone] with questions or concerns regarding your invoice.   IF you received labwork today, you will receive an invoice from [place] [facility]. Please contact [facility] at [phone] with questions or concerns regarding your invoice.   Our billing staff will not be able to assist you with questions regarding bills from these companies.  You will be contacted with the lab results as soon as they are available. The fastest way to get your results is to activate your My Chart account. Instructions are located on the last page of this paperwork. If you have not heard from [place] regarding the results in 2 weeks, please contact this office.       I personally performed the services described in this documentation, which was scribed in my presence. The recorded information has been reviewed and considered for accuracy and completeness, addended by me as needed, and agree with information above.  Signed,   [name], MD Primary Care at [facility].  11/25/16 12:58 PM

## 2016-11-26 LAB — PSA: PROSTATE SPECIFIC AG, SERUM: 0.4 ng/mL (ref 0.0–4.0)

## 2016-12-01 ENCOUNTER — Encounter: Payer: Self-pay | Admitting: *Deleted

## 2016-12-05 ENCOUNTER — Telehealth: Payer: Self-pay | Admitting: Family Medicine

## 2016-12-05 NOTE — Telephone Encounter (Signed)
Copied from [facility] #[id number]. Topic: Inquiry >> Dec 05, 2016  1:46 PM Pricilla Handler wrote: Reason for CRM: Patient called stating that he needs his prescriptions sent to the [facility]. Please call patient back today!!!

## 2016-12-05 NOTE — Telephone Encounter (Signed)
Please review

## 2016-12-08 NOTE — Telephone Encounter (Signed)
Prescription sent

## 2016-12-17 ENCOUNTER — Other Ambulatory Visit: Payer: Self-pay | Admitting: *Deleted

## 2016-12-17 MED ORDER — VIAGRA 100 MG PO TABS: 50.0000 mg | ORAL_TABLET | Freq: Every day | ORAL | 3 refills | Status: DC | PRN

## 2016-12-24 DIAGNOSIS — L821 Other seborrheic keratosis: Secondary | ICD-10-CM | POA: Diagnosis not present

## 2016-12-24 DIAGNOSIS — D225 Melanocytic nevi of trunk: Secondary | ICD-10-CM | POA: Diagnosis not present

## 2016-12-24 DIAGNOSIS — L814 Other melanin hyperpigmentation: Secondary | ICD-10-CM | POA: Diagnosis not present

## 2016-12-24 DIAGNOSIS — L570 Actinic keratosis: Secondary | ICD-10-CM | POA: Diagnosis not present

## 2017-05-13 ENCOUNTER — Emergency Department (HOSPITAL_COMMUNITY)
Admission: EM | Admit: 2017-05-13 | Discharge: 2017-05-13 | Disposition: A | Discharge status: Home / Self Care | Payer: 59 | Attending: Emergency Medicine | Admitting: Emergency Medicine

## 2017-05-13 ENCOUNTER — Encounter (HOSPITAL_COMMUNITY): Payer: Self-pay | Admitting: Emergency Medicine

## 2017-05-13 ENCOUNTER — Emergency Department (HOSPITAL_COMMUNITY): Payer: 59

## 2017-05-13 DIAGNOSIS — Z79899 Other long term (current) drug therapy: Secondary | ICD-10-CM | POA: Insufficient documentation

## 2017-05-13 DIAGNOSIS — R030 Elevated blood-pressure reading, without diagnosis of hypertension: Secondary | ICD-10-CM | POA: Diagnosis not present

## 2017-05-13 DIAGNOSIS — I160 Hypertensive urgency: Secondary | ICD-10-CM

## 2017-05-13 DIAGNOSIS — I10 Essential (primary) hypertension: Secondary | ICD-10-CM | POA: Insufficient documentation

## 2017-05-13 DIAGNOSIS — Z7982 Long term (current) use of aspirin: Secondary | ICD-10-CM | POA: Insufficient documentation

## 2017-05-13 DIAGNOSIS — F419 Anxiety disorder, unspecified: Secondary | ICD-10-CM | POA: Diagnosis present

## 2017-05-13 DIAGNOSIS — Z87891 Personal history of nicotine dependence: Secondary | ICD-10-CM | POA: Insufficient documentation

## 2017-05-13 DIAGNOSIS — R000 Tachycardia, unspecified: Secondary | ICD-10-CM | POA: Diagnosis not present

## 2017-05-13 LAB — CBC
HEMATOCRIT: 43.1 % (ref 39.0–52.0)
HEMOGLOBIN: 15.2 g/dL (ref 13.0–17.0)
MCH: 31.6 pg (ref 26.0–34.0)
MCHC: 35.3 g/dL (ref 30.0–36.0)
MCV: 89.6 fL (ref 78.0–100.0)
Platelets: 239 10*3/uL (ref 150–400)
RBC: 4.81 MIL/uL (ref 4.22–5.81)
RDW: 13.1 % (ref 11.5–15.5)
WBC: 8.8 10*3/uL (ref 4.0–10.5)

## 2017-05-13 LAB — BASIC METABOLIC PANEL
ANION GAP: 15 (ref 5–15)
BUN: 9 mg/dL (ref 6–20)
CO2: 20 mmol/L — AB (ref 22–32)
Calcium: 9.3 mg/dL (ref 8.9–10.3)
Chloride: 96 mmol/L — ABNORMAL LOW (ref 101–111)
Creatinine, Ser: 0.8 mg/dL (ref 0.61–1.24)
GFR calc non Af Amer: >60 mL/min (ref >60)
GLUCOSE: 125 mg/dL — AB (ref 65–99)
POTASSIUM: 3.8 mmol/L (ref 3.5–5.1)
Sodium: 131 mmol/L — ABNORMAL LOW (ref 135–145)

## 2017-05-13 LAB — I-STAT TROPONIN, ED: Troponin i, poc: 0 ng/mL (ref 0.00–0.08)

## 2017-05-13 MED ORDER — METOPROLOL SUCCINATE ER 25 MG PO TB24
25.0000 mg | ORAL_TABLET | Freq: Once | ORAL | Status: AC
  Administered 2017-05-13: 25 mg via ORAL
  Filled 2017-05-13 (×2): qty 1

## 2017-05-13 MED ORDER — METOPROLOL SUCCINATE ER 25 MG PO TB24: 25.0000 mg | ORAL_TABLET | Freq: Every day | ORAL | 0 refills | Status: DC

## 2017-05-13 NOTE — ED Triage Notes (Signed)
Pt states he was around the house and felt arm tingling. He checked his BP and noted it was high. He took an  Additional dose of amlodipine. Pt denies pain, nausea or vomiting. Went to work but came home. Pt visibly anxious, shaking, jaws twitching, pt admits feeling anxious. Denies chest pain. Hypertensive at triage.

## 2017-05-13 NOTE — ED Provider Notes (Signed)
[facility] EM[facility] DEPARTMENT Provider Note   CSN: [id number] Arrival date & time: 05/13/17  1208     History   Chief Complaint Chief Complaint  Patient presents with  . Anxiety  . Hypertension    HPI Samuel Macias is a 62 y.o. male.  HPI  62 year old male presents with hypertension.  The patient has a long-standing history of hypertension and is on losartan and amlodipine.  He used to be on metoprolol but was taken off of this because he was having symptom medic bradycardia and he has been losing weight.  However recently he has gained back all of that weight and may be a little more.  He does not know his blood pressure typically runs because he only gets it checked at the doctor's office.  Today he woke up not feeling quite well and did not go to work.  At around 11 AM he felt some tingling to his left dorsal forearm.  It was in a small area.  He can actually still feel and was not numb and his arms was not week.  Checked BP and was 109/115. He has had a similar tingling feeling with elevated BP as well. He had no headache, chest pain, shortness of breath, blurry vision, dizziness, or weakness/numbness.  He states the tingling came on and off over the hour or so.  He took an extra amlodipine because he could not quite remember if he had taken it that morning.  He was not sure if he took the losartan either.  Since then, he has been having some anxiety about the entire situation, especially on the right appear.  Anxiety is much better and he currently feels well.  Past Medical History:  Diagnosis Date  . Hyperlipidemia   . Hypertension   . Personal history of colonic polyps - adenomas 11/05/2006    Patient Active Problem List   Diagnosis Date Noted  . Symptomatic bradycardia 04/25/2016  . Dizziness 03/17/2016  . Cough 04/26/2015  . GERD (gastroesophageal reflux disease) 04/26/2015  . Laryngopharyngeal reflux (LPR) 04/26/2015  . Hypertension 03/01/2011    . Hyperlipidemia 03/01/2011  . Personal history of colonic polyps - adenomas 11/05/2006    Past Surgical History:  Procedure Laterality Date  . EXTERNAL EAR SURGERY     as child  . pain block     Herniated disc repair  . WRIST SURGERY     tendon        Home Medications    Prior to Admission medications   Medication Sig Start Date End Date Taking? Authorizing Provider  amLODipine (NORVASC) 2.5 MG tablet Take 1 tablet (2.5 mg total) by mouth daily. 10/30/16   Wendie Agreste, MD  aspirin 81 MG tablet Take 81 mg by mouth daily.    [provider]  losartan (COZAAR) 100 MG tablet Take 1 tablet (100 mg total) by mouth daily. 10/30/16   Wendie Agreste, MD  meloxicam (MOBIC) 15 MG tablet Take 1 tablet (15 mg total) by mouth daily. 08/24/15   Hyatt, Max T, DPM  metoprolol succinate (TOPROL-XL) 25 MG 24 hr tablet Take 1 tablet (25 mg total) by mouth daily. 05/14/17   Sherwood Gambler, MD  Omega-3 Fatty Acids (FISH OIL) 1200 MG CAPS Take 1 capsule by mouth daily.    [provider]  rosuvastatin (CRESTOR) 20 MG tablet Take 1 tablet (20 mg total) by mouth daily. 10/30/16   Wendie Agreste, MD  VIAGRA 100 MG tablet  Take 0.5-1 tablets (50-100 mg total) by mouth daily as needed for erectile dysfunction. 12/17/16   Wendie Agreste, MD    Family History Family History  Problem Relation Age of Onset  . Crohn's disease Brother   . COPD Mother   . Colon cancer Neg Hx     Social History Social History   Tobacco Use  . Smoking status: Former Smoker    Packs/day: 0.40    Years: 5.00    Pack years: 2.00    Types: Cigarettes    Last attempt to quit: 01/27/1981    Years since quitting: 36.3  . Smokeless tobacco: Former Systems developer    Types: Chew    Quit date: 01/27/1981  Substance Use Topics  . Alcohol use: Yes    Alcohol/week: 7.2 oz    Types: 12 Standard drinks or equivalent per week    Comment: 12 pack weekly  . Drug use: No     Allergies   Patient has no known  allergies.   Review of Systems Review of Systems  Constitutional: Negative for fever.  Eyes: Negative for visual disturbance.  Respiratory: Negative for shortness of breath.   Cardiovascular: Negative for chest pain.  Gastrointestinal: Negative for abdominal pain.  Neurological: Negative for dizziness, weakness and headaches.  All other systems reviewed and are negative.    Physical Exam Updated Vital Signs BP (!) 191/97   Pulse 90   Temp 99.5 F (37.5 C) (Oral)   Resp 13   Ht 5\' 6"  (1.676 m)   Wt 72.6 kg (160 lb)   SpO2 98%   BMI 25.82 kg/m   Physical Exam  Constitutional: He is oriented to person, place, and time. He appears well-developed and well-nourished. No distress.  HENT:  Head: Normocephalic and atraumatic.  Right Ear: External ear normal.  Left Ear: External ear normal.  Nose: Nose normal.  Eyes: Pupils are equal, round, and reactive to light. EOM are normal. Right eye exhibits no discharge. Left eye exhibits no discharge.  Neck: Neck supple.  Cardiovascular: Normal rate, regular rhythm and normal heart sounds.  Pulmonary/Chest: Effort normal and breath sounds normal.  Abdominal: Soft. There is no tenderness.  Musculoskeletal: He exhibits no edema.  Neurological: He is alert and oriented to person, place, and time. He displays tremor (with movement, all 4 extremities).  CN 3-12 grossly intact. 5/5 strength in all 4 extremities. Grossly normal sensation. Normal finger to nose.   Skin: Skin is warm and dry. He is not diaphoretic.  Nursing note and vitals reviewed.    ED Treatments / Results  Labs (all labs ordered are listed, but only abnormal results are displayed) Labs Reviewed  BASIC METABOLIC PANEL - Abnormal; Notable for the following components:      Result Value   Sodium 131 (*)    Chloride 96 (*)    CO2 20 (*)    Glucose, Bld 125 (*)    All other components within normal limits  CBC  I-STAT TROPONIN, ED    EKG EKG  Interpretation  Date/Time:  Wednesday May 13 2017 12:10:40 EDT Ventricular Rate:  106 PR Interval:  154 QRS Duration: 92 QT Interval:  330 QTC Calculation: 438 R Axis:   61 Text Interpretation:  Sinus tachycardia Possible Left atrial enlargement Borderline ECG rate faster, otherwise similar to 2003 Confirmed by Sherwood Gambler [phone]) on 05/13/2017 4:43:26 PM   Radiology Dg Chest 2 View  Result Date: 05/13/2017 CLINICAL DATA:  Patient felt arm tingling.  Blood  pressure high. EXAM: CHEST - 2 VIEW COMPARISON:  None. FINDINGS: The heart size and mediastinal contours are within normal limits. Both lungs are clear. The visualized skeletal structures are unremarkable. IMPRESSION: No active cardiopulmonary disease. Electronically Signed   By: Staci Righter M.D.   On: 05/13/2017 12:48    Procedures Procedures (including critical care time)  Medications Ordered in ED Medications  metoprolol succinate (TOPROL-XL) 24 hr tablet 25 mg (25 mg Oral Given 05/13/17 1752)     Initial Impression / Assessment and Plan / ED Course  I have reviewed the triage vital signs and the nursing notes.  Pertinent labs & imaging results that were available during my care of the patient were reviewed by me and considered in my medical decision making (see chart for details).     Patient is currently asymptomatic.  He does have elevated blood pressure, ranging from 881 systolic to 103 systolic.  However, he has no further symptoms.  He states the anxiety has resolved.  I think the anxiety and likely increased breathing is cause for the mild low bicarbonate and the initial tachycardia.  However even at rest his heart rate is ranging from 80-90.  Thus, when discussing with patient he would like to go back on metoprolol since he has gained weight which is likely contributing to worsening blood pressure.  I will do this both as a prescription and a dose now.  I discussed that the best case for his blood pressure would  be to follow-up closely with PCP given he is not having a hypertensive emergency.  I highly doubt the tingling in his forearm only was stroke related or TIA.  I do not think further ER workup is needed.  We discussed strict return precautions but he otherwise appears stable for discharge home with close outpatient follow-up.  Final Clinical Impressions(s) / ED Diagnoses   Final diagnoses:  Hypertensive urgency    ED Discharge Orders        Ordered    metoprolol succinate (TOPROL-XL) 25 MG 24 hr tablet  Daily     05/13/17 1744       Sherwood Gambler, MD 05/13/17 [phone]

## 2017-05-19 ENCOUNTER — Other Ambulatory Visit: Payer: Self-pay

## 2017-05-19 ENCOUNTER — Encounter: Payer: Self-pay | Admitting: Family Medicine

## 2017-05-19 ENCOUNTER — Ambulatory Visit: Payer: 59 | Admitting: Family Medicine

## 2017-05-19 VITALS — BP 142/82 | HR 70 | Temp 98.1°F | Ht 66.0 in | Wt 172.6 lb

## 2017-05-19 DIAGNOSIS — E871 Hypo-osmolality and hyponatremia: Secondary | ICD-10-CM

## 2017-05-19 DIAGNOSIS — I10 Essential (primary) hypertension: Secondary | ICD-10-CM | POA: Diagnosis not present

## 2017-05-19 MED ORDER — METOPROLOL SUCCINATE ER 25 MG PO TB24: 25.0000 mg | ORAL_TABLET | Freq: Every day | ORAL | 1 refills | Status: DC

## 2017-05-19 MED ORDER — AMLODIPINE BESYLATE 5 MG PO TABS: 5.0000 mg | ORAL_TABLET | Freq: Every day | ORAL | 1 refills | Status: DC

## 2017-05-19 MED ORDER — METOPROLOL SUCCINATE ER 25 MG PO TB24: 25.0000 mg | ORAL_TABLET | Freq: Every day | ORAL | 0 refills | Status: DC

## 2017-05-19 NOTE — Progress Notes (Signed)
Subjective:  By signing my name below, I, Moises Blood, attest that this documentation has been prepared under the direction and in the presence of [name], MD. Electronically Signed: Moises Blood, [place]. 05/19/2017 , 5:11 PM .  Patient was seen in Room 2 .   Patient ID: Samuel Macias, male    DOB: [birth year], 62 y.o.   MRN: [id number] Chief Complaint  Patient presents with  . Hospitalization Follow-up    hyper[place]ston   HPI Samuel Macias is a 62 y.o. male Here for hospitalization follow up. He has a history of HTN. Patient was seen in the ER 6 days ago for hypertensive urgency with BP of 191/97. Initially, patient had been losing weight, so medications were decreased, but weight had increased recently apparently. He had stopped metoprolol due to bradycardia. His BP was 116/70 in Oct 2018 when I last saw him, and weight of 162. At that visit, he was taking Losartan 100mg  QD and Norvasc 2.5mg  QD.   On April 17th ER visit, he had woken up with tingling over his left forearm (small area) without weakness. He had checked his BP and apparently, it was 109/115. He denies any other focal neurological symptoms. He denied chest pain. He states the tingling in the forearm was intermittent. He was unsure if he had taken his Losartan that day. ER labs reviewed with sodium 131, glucose 125, but normal troponin. EKG was sinus tachycardia, similar to previous reading. Xray showed no active cardiopulmonary disease. He was given Toprol XL 25mg  due to persistent elevated heart rate. His metoprolol was restarted and continued on amlodipine and losartan.   BP Readings from Last 3 Encounters:  05/19/17 (!) 142/82  05/13/17 (!) 170/93  11/25/16 116/70   Wt Readings from Last 3 Encounters:  05/19/17 172 lb 9.6 oz (78.3 kg)  05/13/17 160 lb (72.6 kg)  11/25/16 162 lb (73.5 kg)   Lab Results  Component Value Date   CREATININE 0.80 05/13/2017   Today Patient believes he did take his  medications the day of ER visit, as it was so routine. He denies any side effects with Toprol. He is still taking Losartan 100mg  and Norvasc 2.5mg  QD. He was informed there was a recall of Losartan, but hasn't confirmed with his pharmacist. His BP readings at home have been consistently in the systolic 017P. He's been walking daily for exercise, but plans to improve his diet. He mentions the tingling in his arm had resolved. He denies chest pain, shortness of breath, lightheadedness, dizziness, dark tarry stools, weakness, or slurred speech. He is not fasting today.   He's been working part time with [employer]. He used to work with [employer] and retired in 2015.   Patient Active Problem List   Diagnosis Date Noted  . Symptomatic bradycardia 04/25/2016  . Dizziness 03/17/2016  . Cough 04/26/2015  . GERD (gastroesophageal reflux disease) 04/26/2015  . Laryngopharyngeal reflux (LPR) 04/26/2015  . Hypertension 03/01/2011  . Hyperlipidemia 03/01/2011  . Personal history of colonic polyps - adenomas 11/05/2006   Past Medical History:  Diagnosis Date  . Hyperlipidemia   . Hypertension   . Personal history of colonic polyps - adenomas 11/05/2006   Past Surgical History:  Procedure Laterality Date  . EXTERNAL EAR SURGERY     as child  . pain block     Herniated disc repair  . WRIST SURGERY     tendon   No Known Allergies Prior to Admission medications   Medication Sig Start  Date End Date Taking? Authorizing Provider  amLODipine (NORVASC) 2.5 MG tablet Take 1 tablet (2.5 mg total) by mouth daily. 10/30/16   Wendie Agreste, MD  aspirin 81 MG tablet Take 81 mg by mouth daily.    [provider]  losartan (COZAAR) 100 MG tablet Take 1 tablet (100 mg total) by mouth daily. 10/30/16   Wendie Agreste, MD  meloxicam (MOBIC) 15 MG tablet Take 1 tablet (15 mg total) by mouth daily. 08/24/15   Hyatt, Max T, DPM  metoprolol succinate (TOPROL-XL) 25 MG 24 hr tablet Take 1 tablet (25 mg total) by mouth  daily. 05/14/17   Sherwood Gambler, MD  Omega-3 Fatty Acids (FISH OIL) 1200 MG CAPS Take 1 capsule by mouth daily.    [provider]  rosuvastatin (CRESTOR) 20 MG tablet Take 1 tablet (20 mg total) by mouth daily. 10/30/16   Wendie Agreste, MD  VIAGRA 100 MG tablet Take 0.5-1 tablets (50-100 mg total) by mouth daily as needed for erectile dysfunction. 12/17/16   Wendie Agreste, MD   Social History   Socioeconomic History  . Marital status: Married    Spouse name: [place]  . Number of children: 2  . Years of education: 12th grade  . Highest education level: Not on file  Occupational History  . Occupation: Education officer, community for [employer]  . Financial resource strain: Not on file  . Food insecurity:    Worry: Not on file    Inability: Not on file  . Transportation needs:    Medical: Not on file    Non-medical: Not on file  Tobacco Use  . Smoking status: Former Smoker    Packs/day: 0.40    Years: 5.00    Pack years: 2.00    Types: Cigarettes    Last attempt to quit: 01/27/1981    Years since quitting: 36.3  . Smokeless tobacco: Former Systems developer    Types: [place] date: 01/27/1981  Substance and Sexual Activity  . Alcohol use: Yes    Alcohol/week: 7.2 oz    Types: 12 Standard drinks or equivalent per week    Comment: 12 pack weekly  . Drug use: No  . Sexual activity: Yes  Lifestyle  . Physical activity:    Days per week: Not on file    Minutes per session: Not on file  . Stress: Not on file  Relationships  . Social connections:    Talks on phone: Not on file    Gets together: Not on file    Attends religious service: Not on file    Active member of club or organization: Not on file    Attends meetings of clubs or organizations: Not on file    Relationship status: Not on file  . Intimate partner violence:    Fear of current or ex partner: Not on file    Emotionally abused: Not on file    Physically abused: Not on file    Forced sexual activity:  Not on file  Other Topics Concern  . Not on file  Social History Narrative   Lives with his wife and son, [name]. His daughter lives independently in [place], [place].   Education: High School   Exercise: Yes   Review of Systems  Constitutional: Negative for fatigue and unexpected weight change.  Eyes: Negative for visual disturbance.  Respiratory: Negative for cough, chest tightness and shortness of breath.   Cardiovascular: Negative for chest pain,  palpitations and leg swelling.  Gastrointestinal: Negative for abdominal pain and blood in stool.  Neurological: Negative for dizziness, light-headedness and headaches.       Objective:   Physical Exam  Constitutional: He is oriented to person, place, and time. He appears well-developed and well-nourished.  HENT:  Head: Normocephalic and atraumatic.  Eyes: Pupils are equal, round, and reactive to light. EOM are normal.  Neck: No JVD present. Carotid bruit is not present.  Cardiovascular: Normal rate, regular rhythm and normal heart sounds.  No murmur heard. Pulmonary/Chest: Effort normal and breath sounds normal. He has no rales.  Musculoskeletal: He exhibits no edema.  Neurological: He is alert and oriented to person, place, and time.  No facial droop  Skin: Skin is warm and dry.  Psychiatric: He has a normal mood and affect.  Vitals reviewed.   Vitals:   05/19/17 1635  BP: (!) 142/82  Pulse: 70  Temp: 98.1 F (36.7 C)  TempSrc: Oral  SpO2: 97%  Weight: 172 lb 9.6 oz (78.3 kg)  Height: 5\' 6"  (1.676 m)       Assessment & Plan:    Samuel Macias is a 62 y.o. male Essential hypertension - Plan: amLODipine (NORVASC) 5 MG tablet, metoprolol succinate (TOPROL-XL) 25 MG 24 hr tablet  -ER notes reviewed from hypertensive urgency, unsure if he had taken medications at that time, and may have had an anxiety component.  Current blood pressure not quite at goal but significantly improved.  Discussed change in diet, sodium  intake, but for now will increase amlodipine to 5 mg daily, continue Toprol 25 mg daily and losartan 100 mg daily.  Recheck in 1 month with fasting blood work at that time.  RTC/ER precautions if new symptoms or worsening blood pressure sooner.  Hyponatremia - Plan: Basic metabolic panel  -Noted in ER, repeat BMP.   Meds ordered this encounter  Medications  . DISCONTD: metoprolol succinate (TOPROL-XL) 25 MG 24 hr tablet    Sig: Take 1 tablet (25 mg total) by mouth daily.    Dispense:  30 tablet    Refill:  0  . amLODipine (NORVASC) 5 MG tablet    Sig: Take 1 tablet (5 mg total) by mouth daily.    Dispense:  90 tablet    Refill:  1  . metoprolol succinate (TOPROL-XL) 25 MG 24 hr tablet    Sig: Take 1 tablet (25 mg total) by mouth daily.    Dispense:  90 tablet    Refill:  1   Patient Instructions    Blood pressure looks better, but still mildly elevated. Try to change diet - avoid sodium containing foods as possible, and see info below.  For now increase the amlodipine to 5mg  per day. Continue same dose of Losartan and Toprol for now. Recheck in 1 month for blood pressure and fasting labwork. Return to the clinic or go to the nearest emergency room if any of your symptoms worsen or new symptoms occur.   I would discuss with your pharmacist to see if your losartan was affected by the recall.      Hypertension Hypertension, commonly called high blood pressure, is when the force of blood pumping through the arteries is too strong. The arteries are the blood vessels that carry blood from the heart throughout the body. Hypertension forces the heart to work Macias to pump blood and may cause arteries to become narrow or stiff. Having untreated or uncontrolled hypertension can cause heart attacks, strokes,  kidney disease, and other problems. A blood pressure reading consists of a higher number over a lower number. Ideally, your blood pressure should be below 120/80. The first ("top")  number is called the systolic pressure. It is a measure of the pressure in your arteries as your heart beats. The second ("bottom") number is called the diastolic pressure. It is a measure of the pressure in your arteries as the heart relaxes. What are the causes? The cause of this condition is not known. What increases the risk? Some risk factors for high blood pressure are under your control. Others are not. Factors you can change  Smoking.  Having type 2 diabetes mellitus, high cholesterol, or both.  Not getting enough exercise or physical activity.  Being overweight.  Having too much fat, sugar, calories, or salt (sodium) in your diet.  Drinking too much alcohol. Factors that are difficult or impossible to change  Having chronic kidney disease.  Having a family history of high blood pressure.  Age. Risk increases with age.  Race. You may be at higher risk if you are African-American.  Gender. Men are at higher risk than women before age 36. After age 68, women are at higher risk than men.  Having obstructive sleep apnea.  Stress. What are the signs or symptoms? Extremely high blood pressure (hypertensive crisis) may cause:  Headache.  Anxiety.  Shortness of breath.  Nosebleed.  Nausea and vomiting.  Severe chest pain.  Jerky movements you cannot control (seizures).  How is this diagnosed? This condition is diagnosed by measuring your blood pressure while you are seated, with your arm resting on a surface. The cuff of the blood pressure monitor will be placed directly against the skin of your upper arm at the level of your heart. It should be measured at least twice using the same arm. Certain conditions can cause a difference in blood pressure between your right and left arms. Certain factors can cause blood pressure readings to be lower or higher than normal (elevated) for a short period of time:  When your blood pressure is higher when you are in a health  care provider's office than when you are at home, this is called white coat hypertension. Most people with this condition do not need medicines.  When your blood pressure is higher at home than when you are in a health care provider's office, this is called masked hypertension. Most people with this condition may need medicines to control blood pressure.  If you have a high blood pressure reading during one visit or you have normal blood pressure with other risk factors:  You may be asked to return on a different day to have your blood pressure checked again.  You may be asked to monitor your blood pressure at home for 1 week or longer.  If you are diagnosed with hypertension, you may have other blood or imaging tests to help your health care provider understand your overall risk for other conditions. How is this treated? This condition is treated by making healthy lifestyle changes, such as eating healthy foods, exercising more, and reducing your alcohol intake. Your health care provider may prescribe medicine if lifestyle changes are not enough to get your blood pressure under control, and if:  Your systolic blood pressure is above 130.  Your diastolic blood pressure is above 80.  Your personal target blood pressure may vary depending on your medical conditions, your age, and other factors. Follow these instructions at home: Eating and drinking  Eat a diet that is high in fiber and potassium, and low in sodium, added sugar, and fat. An example eating plan is called the DASH (Dietary Approaches to Stop Hypertension) diet. To eat this way: ? Eat plenty of fresh fruits and vegetables. Try to fill half of your plate at each meal with fruits and vegetables. ? Eat whole grains, such as whole wheat pasta, brown rice, or whole grain bread. Fill about one quarter of your plate with whole grains. ? Eat or drink low-fat dairy products, such as skim milk or low-fat yogurt. ? Avoid fatty cuts of meat,  processed or cured meats, and poultry with skin. Fill about one quarter of your plate with lean proteins, such as fish, chicken without skin, beans, eggs, and tofu. ? Avoid premade and processed foods. These tend to be higher in sodium, added sugar, and fat.  Reduce your daily sodium intake. Most people with hypertension should eat less than 1,500 mg of sodium a day.  Limit alcohol intake to no more than 1 drink a day for nonpregnant women and 2 drinks a day for men. One drink equals 12 oz of beer, 5 oz of wine, or 1 oz of hard liquor. Lifestyle  Work with your health care provider to maintain a healthy body weight or to lose weight. Ask what an ideal weight is for you.  Get at least 30 minutes of exercise that causes your heart to beat faster (aerobic exercise) most days of the week. Activities may include walking, swimming, or biking.  Include exercise to strengthen your muscles (resistance exercise), such as pilates or lifting weights, as part of your weekly exercise routine. Try to do these types of exercises for 30 minutes at least 3 days a week.  Do not use any products that contain nicotine or tobacco, such as cigarettes and e-cigarettes. If you need help quitting, ask your health care provider.  Monitor your blood pressure at home as told by your health care provider.  Keep all follow-up visits as told by your health care provider. This is important. Medicines  Take over-the-counter and prescription medicines only as told by your health care provider. Follow directions carefully. Blood pressure medicines must be taken as prescribed.  Do not skip doses of blood pressure medicine. Doing this puts you at risk for problems and can make the medicine less effective.  Ask your health care provider about side effects or reactions to medicines that you should watch for. Contact a health care provider if:  You think you are having a reaction to a medicine you are taking.  You have  headaches that keep coming back (recurring).  You feel dizzy.  You have swelling in your ankles.  You have trouble with your vision. Get help right away if:  You develop a severe headache or confusion.  You have unusual weakness or numbness.  You feel faint.  You have severe pain in your chest or abdomen.  You vomit repeatedly.  You have trouble breathing. Summary  Hypertension is when the force of blood pumping through your arteries is too strong. If this condition is not controlled, it may put you at risk for serious complications.  Your personal target blood pressure may vary depending on your medical conditions, your age, and other factors. For most people, a normal blood pressure is less than 120/80.  Hypertension is treated with lifestyle changes, medicines, or a combination of both. Lifestyle changes include weight loss, eating a healthy, low-sodium diet, exercising more,  and limiting alcohol. This information is not intended to replace advice given to you by your health care provider. Make sure you discuss any questions you have with your health care provider. Document Released: 01/13/2005 Document Revised: 12/12/2015 Document Reviewed: 12/12/2015 Elsevier Interactive Patient Education  2018 [employer].    IF you received an x-ray today, you will receive an invoice from [facility]. Please contact [facility] at [phone] with questions or concerns regarding your invoice.   IF you received labwork today, you will receive an invoice from [facility]. Please contact [facility] at [phone] with questions or concerns regarding your invoice.   Our billing staff will not be able to assist you with questions regarding bills from these companies.  You will be contacted with the lab results as soon as they are available. The fastest way to get your results is to activate your My Chart account. Instructions are located on the last page of this paperwork. If  you have not heard from [place] regarding the results in 2 weeks, please contact this office.       I personally performed the services described in this documentation, which was scribed in my presence. The recorded information has been reviewed and considered for accuracy and completeness, addended by me as needed, and agree with information above.  Signed,   [name], MD [facility] [place] [facility].  05/20/17 3:27 PM

## 2017-05-19 NOTE — Patient Instructions (Addendum)
Blood pressure looks better, but still mildly elevated. Try to change diet - avoid sodium containing foods as possible, and see info below.  For now increase the amlodipine to 5mg  per day. Continue same dose of Losartan and Toprol for now. Recheck in 1 month for blood pressure and fasting labwork. Return to the clinic or go to the nearest emergency room if any of your symptoms worsen or new symptoms occur.   I would discuss with your pharmacist to see if your losartan was affected by the recall.      Hypertension Hypertension, commonly called high blood pressure, is when the force of blood pumping through the arteries is too strong. The arteries are the blood vessels that carry blood from the heart throughout the body. Hypertension forces the heart to work harder to pump blood and may cause arteries to become narrow or stiff. Having untreated or uncontrolled hypertension can cause heart attacks, strokes, kidney disease, and other problems. A blood pressure reading consists of a higher number over a lower number. Ideally, your blood pressure should be below 120/80. The first ("top") number is called the systolic pressure. It is a measure of the pressure in your arteries as your heart beats. The second ("bottom") number is called the diastolic pressure. It is a measure of the pressure in your arteries as the heart relaxes. What are the causes? The cause of this condition is not known. What increases the risk? Some risk factors for high blood pressure are under your control. Others are not. Factors you can change  Smoking.  Having type 2 diabetes mellitus, high cholesterol, or both.  Not getting enough exercise or physical activity.  Being overweight.  Having too much fat, sugar, calories, or salt (sodium) in your diet.  Drinking too much alcohol. Factors that are difficult or impossible to change  Having chronic kidney disease.  Having a family history of high blood  pressure.  Age. Risk increases with age.  Race. You may be at higher risk if you are African-American.  Gender. Men are at higher risk than women before age 52. After age 68, women are at higher risk than men.  Having obstructive sleep apnea.  Stress. What are the signs or symptoms? Extremely high blood pressure (hypertensive crisis) may cause:  Headache.  Anxiety.  Shortness of breath.  Nosebleed.  Nausea and vomiting.  Severe chest pain.  Jerky movements you cannot control (seizures).  How is this diagnosed? This condition is diagnosed by measuring your blood pressure while you are seated, with your arm resting on a surface. The cuff of the blood pressure monitor will be placed directly against the skin of your upper arm at the level of your heart. It should be measured at least twice using the same arm. Certain conditions can cause a difference in blood pressure between your right and left arms. Certain factors can cause blood pressure readings to be lower or higher than normal (elevated) for a short period of time:  When your blood pressure is higher when you are in a health care provider's office than when you are at home, this is called white coat hypertension. Most people with this condition do not need medicines.  When your blood pressure is higher at home than when you are in a health care provider's office, this is called masked hypertension. Most people with this condition may need medicines to control blood pressure.  If you have a high blood pressure reading during one visit or you have  normal blood pressure with other risk factors:  You may be asked to return on a different day to have your blood pressure checked again.  You may be asked to monitor your blood pressure at home for 1 week or longer.  If you are diagnosed with hypertension, you may have other blood or imaging tests to help your health care provider understand your overall risk for other  conditions. How is this treated? This condition is treated by making healthy lifestyle changes, such as eating healthy foods, exercising more, and reducing your alcohol intake. Your health care provider may prescribe medicine if lifestyle changes are not enough to get your blood pressure under control, and if:  Your systolic blood pressure is above 130.  Your diastolic blood pressure is above 80.  Your personal target blood pressure may vary depending on your medical conditions, your age, and other factors. Follow these instructions at home: Eating and drinking  Eat a diet that is high in fiber and potassium, and low in sodium, added sugar, and fat. An example eating plan is called the DASH (Dietary Approaches to Stop Hypertension) diet. To eat this way: ? Eat plenty of fresh fruits and vegetables. Try to fill half of your plate at each meal with fruits and vegetables. ? Eat whole grains, such as whole wheat pasta, brown rice, or whole grain bread. Fill about one quarter of your plate with whole grains. ? Eat or drink low-fat dairy products, such as skim milk or low-fat yogurt. ? Avoid fatty cuts of meat, processed or cured meats, and poultry with skin. Fill about one quarter of your plate with lean proteins, such as fish, chicken without skin, beans, eggs, and tofu. ? Avoid premade and processed foods. These tend to be higher in sodium, added sugar, and fat.  Reduce your daily sodium intake. Most people with hypertension should eat less than 1,500 mg of sodium a day.  Limit alcohol intake to no more than 1 drink a day for nonpregnant women and 2 drinks a day for men. One drink equals 12 oz of beer, 5 oz of wine, or 1 oz of hard liquor. Lifestyle  Work with your health care provider to maintain a healthy body weight or to lose weight. Ask what an ideal weight is for you.  Get at least 30 minutes of exercise that causes your heart to beat faster (aerobic exercise) most days of the week.  Activities may include walking, swimming, or biking.  Include exercise to strengthen your muscles (resistance exercise), such as pilates or lifting weights, as part of your weekly exercise routine. Try to do these types of exercises for 30 minutes at least 3 days a week.  Do not use any products that contain nicotine or tobacco, such as cigarettes and e-cigarettes. If you need help quitting, ask your health care provider.  Monitor your blood pressure at home as told by your health care provider.  Keep all follow-up visits as told by your health care provider. This is important. Medicines  Take over-the-counter and prescription medicines only as told by your health care provider. Follow directions carefully. Blood pressure medicines must be taken as prescribed.  Do not skip doses of blood pressure medicine. Doing this puts you at risk for problems and can make the medicine less effective.  Ask your health care provider about side effects or reactions to medicines that you should watch for. Contact a health care provider if:  You think you are having a reaction to  a medicine you are taking.  You have headaches that keep coming back (recurring).  You feel dizzy.  You have swelling in your ankles.  You have trouble with your vision. Get help right away if:  You develop a severe headache or confusion.  You have unusual weakness or numbness.  You feel faint.  You have severe pain in your chest or abdomen.  You vomit repeatedly.  You have trouble breathing. Summary  Hypertension is when the force of blood pumping through your arteries is too strong. If this condition is not controlled, it may put you at risk for serious complications.  Your personal target blood pressure may vary depending on your medical conditions, your age, and other factors. For most people, a normal blood pressure is less than 120/80.  Hypertension is treated with lifestyle changes, medicines, or a  combination of both. Lifestyle changes include weight loss, eating a healthy, low-sodium diet, exercising more, and limiting alcohol. This information is not intended to replace advice given to you by your health care provider. Make sure you discuss any questions you have with your health care provider. Document Released: 01/13/2005 Document Revised: 12/12/2015 Document Reviewed: 12/12/2015 Elsevier Interactive Patient Education  2018 [employer].    IF you received an x-ray today, you will receive an invoice from [facility]. Please contact [facility] at [phone] with questions or concerns regarding your invoice.   IF you received labwork today, you will receive an invoice from [facility]. Please contact [facility] at [phone] with questions or concerns regarding your invoice.   Our billing staff will not be able to assist you with questions regarding bills from these companies.  You will be contacted with the lab results as soon as they are available. The fastest way to get your results is to activate your My Chart account. Instructions are located on the last page of this paperwork. If you have not heard from [place] regarding the results in 2 weeks, please contact this office.

## 2017-05-20 LAB — BASIC METABOLIC PANEL
BUN/Creatinine Ratio: 25 — ABNORMAL HIGH (ref 10–24)
BUN: 22 mg/dL (ref 8–27)
CALCIUM: 9.4 mg/dL (ref 8.6–10.2)
CO2: 21 mmol/L (ref 20–29)
Chloride: 100 mmol/L (ref 96–106)
Creatinine, Ser: 0.88 mg/dL (ref 0.76–1.27)
GFR, EST AFRICAN AMERICAN: 106 mL/min/{1.73_m2} (ref >59)
GFR, EST NON AFRICAN AMERICAN: 92 mL/min/{1.73_m2} (ref >59)
Glucose: 90 mg/dL (ref 65–99)
Potassium: 4.2 mmol/L (ref 3.5–5.2)
Sodium: 136 mmol/L (ref 134–144)

## 2017-06-01 ENCOUNTER — Encounter: Payer: Self-pay | Admitting: *Deleted

## 2017-06-18 ENCOUNTER — Ambulatory Visit: Payer: 59 | Admitting: Family Medicine

## 2017-06-18 ENCOUNTER — Encounter: Payer: Self-pay | Admitting: Family Medicine

## 2017-06-18 VITALS — BP 160/84 | HR 65 | Temp 98.1°F | Ht 66.0 in | Wt 171.4 lb

## 2017-06-18 DIAGNOSIS — I10 Essential (primary) hypertension: Secondary | ICD-10-CM | POA: Diagnosis not present

## 2017-06-18 DIAGNOSIS — Z1329 Encounter for screening for other suspected endocrine disorder: Secondary | ICD-10-CM

## 2017-06-18 DIAGNOSIS — R7303 Prediabetes: Secondary | ICD-10-CM

## 2017-06-18 DIAGNOSIS — E785 Hyperlipidemia, unspecified: Secondary | ICD-10-CM | POA: Diagnosis not present

## 2017-06-18 MED ORDER — METOPROLOL SUCCINATE ER 50 MG PO TB24: 50.0000 mg | ORAL_TABLET | Freq: Every day | ORAL | 1 refills | Status: DC

## 2017-06-18 MED ORDER — AMLODIPINE BESYLATE 10 MG PO TABS: 10.0000 mg | ORAL_TABLET | Freq: Every day | ORAL | 1 refills | Status: DC

## 2017-06-18 NOTE — Patient Instructions (Addendum)
  Continue amlodipine at 10 mg each day.  Ok to continue to take 2 of the 5 mg for now until the 10 mg dose arrives.  Increase metoprolol to 50 mg each day.  I sent a new prescription in, but you can double up on your current 25 mg prescription for now.  Watch for lightheadedness or any low heart rates with that change.  If you do have new side effects please return to discuss those symptoms.  Follow-up in 3 months for blood pressure, but you can give me an update in the next 4 to 6 weeks by mychart.  Information on your paperwork was provided on signing up for my chart.  Continue to watch diet, low intensity exercise such as walking most days per week.  I will recheck your blood sugar testing as well as a thyroid test today.  No change in cholesterol medications for now, I will also check those levels today.  Return to the clinic or go to the nearest emergency room if any of your symptoms worsen or new symptoms occur.   IF you received an x-ray today, you will receive an invoice from [facility]. Please contact [facility] at [phone] with questions or concerns regarding your invoice.   IF you received labwork today, you will receive an invoice from [facility]. Please contact [facility] at [phone] with questions or concerns regarding your invoice.   Our billing staff will not be able to assist you with questions regarding bills from these companies.  You will be contacted with the lab results as soon as they are available. The fastest way to get your results is to activate your My Chart account. Instructions are located on the last page of this paperwork. If you have not heard from [place] regarding the results in 2 weeks, please contact this office.

## 2017-06-18 NOTE — Progress Notes (Addendum)
Subjective:  By signing my name below, I, Essence Howell, attest that this documentation has been prepared under the direction and in the presence of Wendie Agreste, MD Electronically Signed: Ladene Artist, ED Scribe 06/18/2017 at 9:06 AM.   Patient ID: Samuel Macias, male    DOB: February 26, 1955, 62 y.o.   MRN: [id number]  Chief Complaint  Patient presents with  . Chronic Condition    1 m f/u   HPI Samuel Macias is a 62 y.o. male who presents to Primary Care at [facility] for f/u.  HTN Seen in ER mid March for hypertensive urgency. Meds were adjusted with addition of Toprol XL 25 mg qd. Continued Norvasc and Losartan. Increased Norvasc to 5 mg qd. Continued Toprol 25 mg and Losartan 100 mg. - Pt reports morning BP reading of 170/84 PTA, average home readings of 145/82. He states he has been taking 10 mg Norvasc since last visit. Denies side-effects.  BP Readings from Last 3 Encounters:  06/18/17 (!) 160/84  05/19/17 (!) 142/82  05/13/17 (!) 170/93   Pre-DM Lab Results  Component Value Date   HGBA1C 5.7 (H) 10/30/2016   Wt Readings from Last 3 Encounters:  06/18/17 171 lb 6.4 oz (77.7 kg)  05/19/17 172 lb 9.6 oz (78.3 kg)  05/13/17 160 lb (72.6 kg)   Hyperlipidemia Lab Results  Component Value Date   CHOL 146 10/30/2016   HDL 64 10/30/2016   LDLCALC 70 10/30/2016   TRIG 58 10/30/2016   CHOLHDL 2.3 10/30/2016   Lab Results  Component Value Date   ALT 31 10/30/2016   AST 29 10/30/2016   ALKPHOS 44 10/30/2016   BILITOT 0.5 10/30/2016  Crestor 20 mg qd. - Denies myalgias or any other side-effects.  Patient Active Problem List   Diagnosis Date Noted  . Symptomatic bradycardia 04/25/2016  . Dizziness 03/17/2016  . Cough 04/26/2015  . GERD (gastroesophageal reflux disease) 04/26/2015  . Laryngopharyngeal reflux (LPR) 04/26/2015  . Hypertension 03/01/2011  . Hyperlipidemia 03/01/2011  . Personal history of colonic polyps - adenomas 11/05/2006   Past Medical  History:  Diagnosis Date  . Hyperlipidemia   . Hypertension   . Personal history of colonic polyps - adenomas 11/05/2006   Past Surgical History:  Procedure Laterality Date  . EXTERNAL EAR SURGERY     as child  . pain block     Herniated disc repair  . WRIST SURGERY     tendon   No Known Allergies Prior to Admission medications   Medication Sig Start Date End Date Taking? Authorizing Provider  amLODipine (NORVASC) 5 MG tablet Take 1 tablet (5 mg total) by mouth daily. 05/19/17   Wendie Agreste, MD  aspirin 81 MG tablet Take 81 mg by mouth daily.    [provider]  losartan (COZAAR) 100 MG tablet Take 1 tablet (100 mg total) by mouth daily. 10/30/16   Wendie Agreste, MD  meloxicam (MOBIC) 15 MG tablet Take 1 tablet (15 mg total) by mouth daily. 08/24/15   Hyatt, Max T, DPM  metoprolol succinate (TOPROL-XL) 25 MG 24 hr tablet Take 1 tablet (25 mg total) by mouth daily. 05/19/17   Wendie Agreste, MD  Omega-3 Fatty Acids (FISH OIL) 1200 MG CAPS Take 1 capsule by mouth daily.    [provider]  rosuvastatin (CRESTOR) 20 MG tablet Take 1 tablet (20 mg total) by mouth daily. 10/30/16   Wendie Agreste, MD  VIAGRA 100 MG tablet Take 0.5-1  tablets (50-100 mg total) by mouth daily as needed for erectile dysfunction. 12/17/16   Wendie Agreste, MD   Social History   Socioeconomic History  . Marital status: Married    Spouse name: Elm Hall  . Number of children: 2  . Years of education: 12th grade  . Highest education level: Not on file  Occupational History  . Occupation: Education officer, community for [employer]  . Financial resource strain: Not on file  . Food insecurity:    Worry: Not on file    Inability: Not on file  . Transportation needs:    Medical: Not on file    Non-medical: Not on file  Tobacco Use  . Smoking status: Former Smoker    Packs/day: 0.40    Years: 5.00    Pack years: 2.00    Types: Cigarettes    Last attempt to quit: 01/27/1981     Years since quitting: 36.4  . Smokeless tobacco: Former Systems developer    Types: [place] date: 01/27/1981  Substance and Sexual Activity  . Alcohol use: Yes    Alcohol/week: 7.2 oz    Types: 12 Standard drinks or equivalent per week    Comment: 12 pack weekly  . Drug use: No  . Sexual activity: Yes  Lifestyle  . Physical activity:    Days per week: Not on file    Minutes per session: Not on file  . Stress: Not on file  Relationships  . Social connections:    Talks on phone: Not on file    Gets together: Not on file    Attends religious service: Not on file    Active member of club or organization: Not on file    Attends meetings of clubs or organizations: Not on file    Relationship status: Not on file  . Intimate partner violence:    Fear of current or ex partner: Not on file    Emotionally abused: Not on file    Physically abused: Not on file    Forced sexual activity: Not on file  Other Topics Concern  . Not on file  Social History Narrative   Lives with his wife and son, [name]. His daughter lives independently in [place], [place].   Education: High School   Exercise: Yes   Review of Systems  Constitutional: Negative for fatigue and unexpected weight change.  Eyes: Negative for visual disturbance.  Respiratory: Negative for cough, chest tightness and shortness of breath.   Cardiovascular: Negative for chest pain, palpitations and leg swelling.  Gastrointestinal: Negative for abdominal pain and blood in stool.  Musculoskeletal: Negative for myalgias.  Neurological: Negative for dizziness, light-headedness and headaches.      Objective:   Physical Exam  Constitutional: He is oriented to person, place, and time. He appears well-developed and well-nourished.  HENT:  Head: Normocephalic and atraumatic.  Eyes: Pupils are equal, round, and reactive to light. EOM are normal.  Neck: No JVD present. Carotid bruit is not present.  Cardiovascular: Normal rate, regular rhythm and  normal heart sounds.  No murmur heard. Pulmonary/Chest: Effort normal and breath sounds normal. He has no rales.  Musculoskeletal: He exhibits no edema.  Neurological: He is alert and oriented to person, place, and time.  Skin: Skin is warm and dry.  Psychiatric: He has a normal mood and affect.  Vitals reviewed.  Vitals:   06/18/17 0841  BP: (!) 160/84  Pulse: 65  Temp: 98.1 F (36.7 C)  TempSrc: Oral  SpO2: 98%  Weight: 171 lb 6.4 oz (77.7 kg)  Height: 5\' 6"  (1.676 m)      Assessment & Plan:   Samuel Macias is a 62 y.o. male Essential hypertension - Plan: Comprehensive metabolic panel, metoprolol succinate (TOPROL-XL) 50 MG 24 hr tablet, amLODipine (NORVASC) 10 MG tablet, TSH Screening for thyroid disorder - Plan: TSH  -Persistently elevated, including on home readings.  Increase Toprol to 50 mg daily, continue amlodipine at 10 mg which he has been dosing at home as well as losartan 100 mg.  Hypotensive/orthostatic precautions given with this change.  Monitor home readings with my chart update in the next 4 to 6 weeks, repeat office visit in 3 months.  -Check TSH to rule out thyroid disease as contributor.  Prediabetes - Plan: Hemoglobin A1c  -Diet/exercise discussed, recheck A1c.  Hyperlipidemia, unspecified hyperlipidemia type - Plan: Lipid panel, Comprehensive metabolic panel  -tolerating Crestor at current dose, check labs, continue same  Meds ordered this encounter  Medications  . metoprolol succinate (TOPROL-XL) 50 MG 24 hr tablet    Sig: Take 1 tablet (50 mg total) by mouth daily.    Dispense:  90 tablet    Refill:  1  . amLODipine (NORVASC) 10 MG tablet    Sig: Take 1 tablet (10 mg total) by mouth daily.    Dispense:  90 tablet    Refill:  1   Patient Instructions    Continue amlodipine at 10 mg each day.  Joellen Jersey continue to take 2 of the 5 mg for now until the 10 mg dose arrives.  Increase metoprolol to 50 mg each day.  I sent a new prescription in, but  you can double up on your current 25 mg prescription for now.  Watch for lightheadedness or any low heart rates with that change.  If you do have new side effects please return to discuss those symptoms.  Follow-up in 3 months for blood pressure, but you can give me an update in the next 4 to 6 weeks by mychart.  Information on your paperwork was provided on signing up for my chart.  Continue to watch diet, low intensity exercise such as walking most days per week.  I will recheck your blood sugar testing as well as a thyroid test today.  No change in cholesterol medications for now, I will also check those levels today.  Return to the clinic or go to the nearest emergency room if any of your symptoms worsen or new symptoms occur.   IF you received an x-ray today, you will receive an invoice from [facility]. Please contact [facility] at [phone] with questions or concerns regarding your invoice.   IF you received labwork today, you will receive an invoice from [facility]. Please contact [facility] at [phone] with questions or concerns regarding your invoice.   Our billing staff will not be able to assist you with questions regarding bills from these companies.  You will be contacted with the lab results as soon as they are available. The fastest way to get your results is to activate your My Chart account. Instructions are located on the last page of this paperwork. If you have not heard from [place] regarding the results in 2 weeks, please contact this office.       I personally performed the services described in this documentation, which was scribed in my presence. The recorded information has been reviewed and considered for accuracy and completeness, addended by  me as needed, and agree with information above.  Signed,   [name], MD [facility] [place].  06/18/17 9:18 AM  07/16/17 addendum: Home BP record reviewed:  26-----  130/76             27----- 122/77               28----- 130/84  JUNE   2------ 123/76             3------ 133/80             5------ 137/78             8------ 133/82             9------ 133/82             11---- 137/80             16---- 131/77                18---- 130/78  No changes.  Signed,   [name], MD [facility] [place].  07/16/17 2:49 PM

## 2017-06-19 LAB — LIPID PANEL
CHOLESTEROL TOTAL: 208 mg/dL — AB (ref 100–199)
Chol/HDL Ratio: 2.8 ratio (ref 0.0–5.0)
HDL: 73 mg/dL (ref >39)
LDL CALC: 116 mg/dL — AB (ref 0–99)
TRIGLYCERIDES: 95 mg/dL (ref 0–149)
VLDL Cholesterol Cal: 19 mg/dL (ref 5–40)

## 2017-06-19 LAB — COMPREHENSIVE METABOLIC PANEL
A/G RATIO: 2 (ref 1.2–2.2)
ALK PHOS: 47 IU/L (ref 39–117)
ALT: 27 IU/L (ref 0–44)
AST: 26 IU/L (ref 0–40)
Albumin: 4.9 g/dL — ABNORMAL HIGH (ref 3.6–4.8)
BILIRUBIN TOTAL: 0.5 mg/dL (ref 0.0–1.2)
BUN / CREAT RATIO: 16 (ref 10–24)
BUN: 12 mg/dL (ref 8–27)
CHLORIDE: 98 mmol/L (ref 96–106)
CO2: 20 mmol/L (ref 20–29)
Calcium: 9.7 mg/dL (ref 8.6–10.2)
Creatinine, Ser: 0.76 mg/dL (ref 0.76–1.27)
GFR calc non Af Amer: 98 mL/min/{1.73_m2} (ref >59)
GFR, EST AFRICAN AMERICAN: 113 mL/min/{1.73_m2} (ref >59)
GLOBULIN, TOTAL: 2.4 g/dL (ref 1.5–4.5)
GLUCOSE: 119 mg/dL — AB (ref 65–99)
Potassium: 4.4 mmol/L (ref 3.5–5.2)
SODIUM: 137 mmol/L (ref 134–144)
TOTAL PROTEIN: 7.3 g/dL (ref 6.0–8.5)

## 2017-06-19 LAB — TSH: TSH: 2.22 u[IU]/mL (ref 0.450–4.500)

## 2017-06-19 LAB — HEMOGLOBIN A1C
Est. average glucose Bld gHb Est-mCnc: 123 mg/dL
HEMOGLOBIN A1C: 5.9 % — AB (ref 4.8–5.6)

## 2017-07-16 ENCOUNTER — Telehealth: Payer: Self-pay

## 2017-07-16 NOTE — Telephone Encounter (Signed)
FYI to Provider  Pt has come by office and stated that provider wanted him to keep track of his BP.   He stated he did and gave [place] a sheet with it.   Here is his last BP reading from May 26- June 18  MAY  26----- 130/76  27----- 122/77   28----- 130/84  JUNE 2------ 123/76  3------ 133/80  5------ 137/78  8------ 133/82  9------ 133/82  11---- 137/80  16---- 131/77   18---- 130/78  I have also placed the sheet in the folder to be scanned.   Thanks, [employer]

## 2017-08-12 ENCOUNTER — Telehealth: Payer: Self-pay | Admitting: Family Medicine

## 2017-08-12 MED ORDER — VIAGRA 100 MG PO TABS: 50.0000 mg | ORAL_TABLET | Freq: Every day | ORAL | 3 refills | Status: DC | PRN

## 2017-08-12 NOTE — Telephone Encounter (Signed)
Copied from [place] [id number]. Topic: Quick Communication - Rx Refill/Question >> Aug 12, 2017  8:26 AM Synthia Innocent wrote: Medication: VIAGRA 100 MG tablet   Has the patient contacted their pharmacy? Yes.   (Agent: If no, request that the patient contact the pharmacy for the refill.) (Agent: If yes, when and what did the pharmacy advise?)  Preferred Pharmacy (with phone number or street name): [facility]  Agent: Please be advised that RX refills may take up to 3 business days. We ask that you follow-up with your pharmacy.

## 2017-08-13 ENCOUNTER — Ambulatory Visit (INDEPENDENT_AMBULATORY_CARE_PROVIDER_SITE_OTHER): Payer: 59

## 2017-08-13 ENCOUNTER — Ambulatory Visit: Payer: 59 | Admitting: Urgent Care

## 2017-08-13 ENCOUNTER — Encounter: Payer: Self-pay | Admitting: Urgent Care

## 2017-08-13 VITALS — BP 153/84 | HR 68 | Temp 98.3°F | Resp 16 | Ht 66.0 in | Wt 181.4 lb

## 2017-08-13 DIAGNOSIS — M542 Cervicalgia: Secondary | ICD-10-CM | POA: Diagnosis not present

## 2017-08-13 MED ORDER — CYCLOBENZAPRINE HCL 5 MG PO TABS: 5.0000 mg | ORAL_TABLET | Freq: Three times a day (TID) | ORAL | 1 refills | Status: DC | PRN

## 2017-08-13 MED ORDER — PREDNISONE 20 MG PO TABS: ORAL_TABLET | ORAL | 0 refills | Status: DC

## 2017-08-13 NOTE — Patient Instructions (Addendum)
Muscle Pain, Adult Muscle pain (myalgia) may be mild or severe. In most cases, the pain lasts only a short time and it goes away without treatment. It is normal to feel some muscle pain after starting a workout program. Muscles that have not been used often will be sore at first. Muscle pain may also be caused by many other things, including:  Overuse or muscle strain, especially if you are not in shape. This is the most common cause of muscle pain.  Injury.  Bruises.  Viruses, such as the flu.  Infectious diseases.  A chronic condition that causes muscle tenderness, fatigue, and headache (fibromyalgia).  A condition, such as lupus, in which the body's disease-fighting system attacks other organs in the body (autoimmune or rheumatologic diseases).  Certain drugs, including ACE inhibitors and statins.  To diagnose the cause of your muscle pain, your health care provider will do a physical exam and ask questions about the pain and when it began. If you have not had muscle pain for very long, your health care provider may want to wait before doing much testing. If your muscle pain has lasted a long time, your health care provider may want to run tests right away. In some cases, this may include tests to rule out certain conditions or illnesses. Treatment for muscle pain depends on the cause. Home care is often enough to relieve muscle pain. Your health care provider may also prescribe anti-inflammatory medicine. Follow these instructions at home: Activity  If overuse is causing your muscle pain: ? Slow down your activities until the pain goes away. ? Do regular, gentle exercises if you are not usually active. ? Warm up before exercising. Stretch before and after exercising. This can help lower the risk of muscle pain.  Do not continue working out if the pain is very bad. Bad pain could mean that you have injured a muscle. Managing pain and discomfort   If directed, apply ice to the  sore muscle: ? Put ice in a plastic bag. ? Place a towel between your skin and the bag. ? Leave the ice on for 20 minutes, 2-3 times a day.  You may also alternate between applying ice and applying heat as told by your health care provider. To apply heat, use the heat source that your health care provider recommends, such as a moist heat pack or a heating pad. ? Place a towel between your skin and the heat source. ? Leave the heat on for 20-30 minutes. ? Remove the heat if your skin turns bright red. This is especially important if you are unable to feel pain, heat, or cold. You may have a greater risk of getting burned. Medicines  Take over-the-counter and prescription medicines only as told by your health care provider.  Do not drive or use heavy machinery while taking prescription pain medicine. Contact a health care provider if:  Your muscle pain gets worse and medicines do not help.  You have muscle pain that lasts longer than 3 days.  You have a rash or fever along with muscle pain.  You have muscle pain after a tick bite.  You have muscle pain while working out, even though you are in good physical condition.  You have redness, soreness, or swelling along with muscle pain.  You have muscle pain after starting a new medicine or changing the dose of a medicine. Get help right away if:  You have trouble breathing.  You have trouble swallowing.  You have   muscle pain along with a stiff neck, fever, and vomiting.  You have severe muscle weakness or cannot move part of your body. This information is not intended to replace advice given to you by your health care provider. Make sure you discuss any questions you have with your health care provider. Document Released: 12/05/2005 Document Revised: 08/03/2015 Document Reviewed: 06/05/2015 Elsevier Interactive Patient Education  2018 [employer].     IF you received an x-ray today, you will receive an invoice from [facility]  Radiology. Please contact [facility] Radiology at [phone] with questions or concerns regarding your invoice.   IF you received labwork today, you will receive an invoice from [place]. Please contact [facility] at [phone] with questions or concerns regarding your invoice.   Our billing staff will not be able to assist you with questions regarding bills from these companies.  You will be contacted with the lab results as soon as they are available. The fastest way to get your results is to activate your My Chart account. Instructions are located on the last page of this paperwork. If you have not heard from [place] regarding the results in 2 weeks, please contact this office.

## 2017-08-13 NOTE — Progress Notes (Signed)
    MRN: [id number] DOB: 01-09-1956  Subjective:   Samuel Macias is a 62 y.o. male presenting for 2-week history of persistent bilateral neck pain at the base of his neck that extends into his trapezius.  Denies falls, trauma, radicular pain, weakness, numbness or tingling.  Denies history of arthritis.  Has been using ibuprofen daily with temporary relief only.  Samuel Macias has a current medication list which includes the following prescription(s): amlodipine, aspirin, losartan, metoprolol succinate, fish oil, rosuvastatin, and viagra. Also has No Known Allergies.  Samuel Macias  has a past medical history of Hyperlipidemia, Hypertension, and Personal history of colonic polyps - adenomas (11/05/2006). Also  has a past surgical history that includes pain block; Wrist surgery; and External ear surgery.  Objective:   Vitals: BP (!) 153/84   Pulse 68   Temp 98.3 F (36.8 C) (Oral)   Resp 16   Ht 5\' 6"  (1.676 m)   Wt 181 lb 6.4 oz (82.3 kg)   SpO2 97%   BMI 29.28 kg/m   Physical Exam  Constitutional: He is oriented to person, place, and time. He appears well-developed and well-nourished.  Cardiovascular: Normal rate.  Pulmonary/Chest: Effort normal.  Musculoskeletal:       Cervical back: He exhibits normal range of motion, no tenderness, no bony tenderness, no swelling, no edema, no deformity and no spasm.  Neurological: He is alert and oriented to person, place, and time.   Dg Cervical Spine Complete  Result Date: 08/13/2017 CLINICAL DATA:  Two week history of neck pain EXAM: CERVICAL SPINE - COMPLETE 4+ VIEW COMPARISON:  None. FINDINGS: There is no evidence of cervical spine fracture or prevertebral soft tissue swelling. Alignment is normal. There are degenerative joint changes of the cervical spine involving C5 through C7 with narrowing of bilateral C5 through 6 C7 neural foramina. There is also narrowing of the right-sided C3-4 neural foramen due to osteophyte encroachment. IMPRESSION: No  acute fracture or dislocation. Degenerative joint changes of the cervical spine more prominently involving C5 through C7 levels. Electronically Signed   By: Abelardo Diesel M.D.   On: 08/13/2017 17:41    Assessment and Plan :   Neck pain - Plan: DG Cervical Spine Complete  Radiology report shows that patient has degenerative disc disease at the cervical level.  We decided to do a prednisone course while he was in clinic.  He is to couple this with Flexeril and we will pursue referral to orthopedics for consult if patient wants to pursue this.  He is to recheck with his PCP, Dr. Carlota Raspberry for his high blood pressure.  Samuel Eagles, PA-C [facility] [phone] 08/13/2017  4:47 PM

## 2017-08-21 NOTE — Telephone Encounter (Signed)
Regular viagra is costing him to much. Requesting generic. [facility] sent to [facility] on [address]

## 2017-08-26 MED ORDER — SILDENAFIL CITRATE 20 MG PO TABS: 20.0000 mg | ORAL_TABLET | Freq: Every day | ORAL | 0 refills | Status: DC | PRN

## 2017-08-26 NOTE — Telephone Encounter (Signed)
Sildenafil prescribed.  However advise him that this is off label use, but it is the same active component of Viagra (but not specifically indicated for erectile dysfunction).  Start with 1 pill, up to 3 pills max for desired effect, lowest effective dose.  Same precautions as we have discussed with Viagra.  Let me know if there are questions.

## 2017-08-26 NOTE — Addendum Note (Signed)
Addended by: [name] R on: 08/26/2017 03:10 PM   Modules accepted: Orders

## 2017-08-26 NOTE — Telephone Encounter (Signed)
Dr Greene please advise.  

## 2017-08-26 NOTE — Telephone Encounter (Signed)
Pt notified of notes per Dr. Carlota Raspberry on 08/26/17. Pt verbalized understanding and states that he has already picked up the prescription. Pt does not have any questions at this time.

## 2017-09-25 ENCOUNTER — Other Ambulatory Visit: Payer: Self-pay | Admitting: Family Medicine

## 2017-09-25 DIAGNOSIS — E785 Hyperlipidemia, unspecified: Secondary | ICD-10-CM

## 2017-09-26 ENCOUNTER — Other Ambulatory Visit: Payer: Self-pay | Admitting: Family Medicine

## 2017-09-29 NOTE — Telephone Encounter (Signed)
Sildenafil refill Last Refill:08/26/17 # 20 Last OV: 11/25/16 PCP: Dr [name] [facility] [place], [place]

## 2017-09-30 NOTE — Telephone Encounter (Signed)
Discussed at previous visit. Refilled.

## 2017-10-03 ENCOUNTER — Encounter: Payer: Self-pay | Admitting: Family Medicine

## 2017-10-03 ENCOUNTER — Ambulatory Visit: Payer: 59 | Admitting: Family Medicine

## 2017-10-03 ENCOUNTER — Other Ambulatory Visit: Payer: Self-pay

## 2017-10-03 VITALS — BP 132/76 | HR 65 | Temp 98.2°F | Resp 16 | Ht 65.0 in | Wt 176.0 lb

## 2017-10-03 DIAGNOSIS — I10 Essential (primary) hypertension: Secondary | ICD-10-CM

## 2017-10-03 NOTE — Patient Instructions (Addendum)
  Amlodipine can cause swelling of the ankles especially in hot weather.  Avoid excessive salt which adds to the swelling of the ankles and elevation of blood pressure.  Try to get regular exercise  Recommend trying to decrease the beer intake a little bit.  Work hard on weight loss, with the goal of getting back down into the 160s.  Continue current medications.  If you keep running high blood pressures return to see Dr. Nyoka Cowden sooner than your designated appointment.  If the pressures are high and the ankle swelling persists he might want to do something like change your losartan to losartan/HCT which has a mild diuretic effect and would help keep the blood pressures down and the fluid off the ankles.  Return sooner if problems   If you have lab work done today you will be contacted with your lab results within the next 2 weeks.  If you have not heard from [place] then please contact us. The fastest way to get your results is to register for My Chart.   IF you received an x-ray today, you will receive an invoice from [facility] Radiology. Please contact [facility] Radiology at [phone] with questions or concerns regarding your invoice.   IF you received labwork today, you will receive an invoice from [facility]. Please contact [facility] at [phone] with questions or concerns regarding your invoice.   Our billing staff will not be able to assist you with questions regarding bills from these companies.  You will be contacted with the lab results as soon as they are available. The fastest way to get your results is to activate your My Chart account. Instructions are located on the last page of this paperwork. If you have not heard from [place] regarding the results in 2 weeks, please contact this office.

## 2017-10-03 NOTE — Progress Notes (Signed)
Patient ID: Samuel Macias, male    DOB: [birth year]  Age: 62 y.o. MRN: [id number]  Chief Complaint  Patient presents with  . Hypertension    per pt checked Bp 162/94, 162/92 this am    Subjective:   62 year old semiretired man who comes in concerned about his blood pressure.  He has been taking at home this week and being getting 160s and 170s over 130s to 150s.  Takes his medicines faithfully.  He eats a lot of salt.  He drinks about 12 beers a week.  He does not smoke.  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  His ankles been swelling him some in the recent weeks this summer.  Denies chest pain or shortness of breath.  No significant dizziness. Current allergies, medications, problem list, past/family and social histories reviewed.  Objective:  BP 132/76 (BP Location: Right Arm)   Pulse 65   Temp 98.2 F (36.8 C) (Oral)   Resp 16   Ht 5\' 5"  (1.651 m)   Wt 176 lb (79.8 kg)   SpO2 99%   BMI 29.29 kg/m   No major acute distress.  No thyromegaly.  Neck supple without nodes.  No carotid bruits.  Chest clear.  Heart regular without murmur.  Minimal ankle edema.  I retook his blood pressures and got 132/76 and 1 36/76  Assessment & Plan:   Assessment: 1. HTN (hypertension), benign       Plan: Explained to him that amlodipine can cause some edema in hot weather especially.  Urged him to minimize salt and get more exercise.  No orders of the defined types were placed in this encounter.   No orders of the defined types were placed in this encounter.        Patient Instructions    Amlodipine can cause swelling of the ankles especially in hot weather.  Avoid excessive salt which adds to the swelling of the ankles and elevation of blood pressure.  Try to get regular exercise  Recommend trying to decrease the beer intake a little bit.  Work hard on weight loss, with the  goal of getting back down into the 160s.  Continue current medications.  If you keep running high blood pressures return to see Dr. Nyoka Cowden sooner than your designated appointment.  If the pressures are high and the ankle swelling persists he might want to do something like change your losartan to losartan/HCT which has a mild diuretic effect and would help keep the blood pressures down and the fluid off the ankles.  Return sooner if problems   If you have lab work done today you will be contacted with your lab results within the next 2 weeks.  If you have not heard from [place] then please contact us. The fastest way to get your results is to register for My Chart.   IF you received an x-ray today, you will receive an invoice from [facility] Radiology. Please contact [facility] Radiology at [phone] with questions or concerns regarding your invoice.   IF you received labwork today, you will receive an invoice from [facility]. Please contact [facility] at [phone] with questions or concerns regarding your invoice.   Our billing staff will not be able to assist you with questions regarding bills from these companies.  You will be contacted with the lab results as soon as they are available. The fastest way  to get your results is to activate your My Chart account. Instructions are located on the last page of this paperwork. If you have not heard from [place] regarding the results in 2 weeks, please contact this office.        Return if symptoms worsen or fail to improve.   Ruben Reason, MD 10/03/2017

## 2017-10-23 ENCOUNTER — Other Ambulatory Visit: Payer: Self-pay | Admitting: Family Medicine

## 2017-10-23 DIAGNOSIS — I10 Essential (primary) hypertension: Secondary | ICD-10-CM

## 2017-10-25 DIAGNOSIS — Z23 Encounter for immunization: Secondary | ICD-10-CM | POA: Diagnosis not present

## 2017-10-25 NOTE — Telephone Encounter (Signed)
Refilled, but message sent to patient to verify doses.

## 2017-10-27 ENCOUNTER — Other Ambulatory Visit: Payer: Self-pay | Admitting: Family Medicine

## 2017-10-27 NOTE — Telephone Encounter (Signed)
Requested Prescriptions  Pending Prescriptions Disp Refills  . sildenafil (REVATIO) 20 MG tablet [Pharmacy Med Name: SILDENAFIL 20 MG TABLET] 20 tablet 0    Sig: TAKE 1-3 TABLETS BY MOUTH DAILY AS NEEDED FOR UP TO 1 DOSE.     Urology: Erectile Dysfunction Agents Passed - 10/27/2017  2:11 AM      Passed - Last BP in normal range    BP Readings from Last 1 Encounters:  10/03/17 132/76         Passed - Valid encounter within last 12 months    Recent Outpatient Visits          3 weeks ago HTN (hypertension), benign   Primary Care at [facility], F[place] Malling, MD   2 months ago Neck pain   Primary Care at [place], [place]   4 months ago Essential hypertension   [facility] Care at [facility], Ranell Patrick, MD   5 months ago Essential hypertension   Primary Care at [facility], Ranell Patrick, MD   11 months ago Annual physical exam   Primary Care at [facility], Ranell Patrick, MD

## 2017-11-24 ENCOUNTER — Other Ambulatory Visit: Payer: Self-pay | Admitting: Family Medicine

## 2017-11-24 DIAGNOSIS — I10 Essential (primary) hypertension: Secondary | ICD-10-CM

## 2017-11-24 NOTE — Telephone Encounter (Signed)
Requested Prescriptions  Pending Prescriptions Disp Refills  . amLODipine (NORVASC) 10 MG tablet [Pharmacy Med Name: AMLODIPINE BESYLATE 10 MG TAB] 90 tablet 2    Sig: TAKE 1 TABLET BY MOUTH EVERY DAY     Cardiovascular:  Calcium Channel Blockers Passed - 11/24/2017  2:02 AM      Passed - Last BP in normal range    BP Readings from Last 1 Encounters:  10/03/17 132/76         Passed - Valid encounter within last 6 months    Recent Outpatient Visits          1 month ago HTN (hypertension), benign   Primary Care at [facility], Fenton Malling, MD   3 months ago Neck pain   Primary Care at [place], [place]   5 months ago Essential hypertension   Primary Care at [facility], Ranell Patrick, MD   6 months ago Essential hypertension   Primary Care at [facility], Ranell Patrick, MD   12 months ago Annual physical exam   Primary Care at [facility], Ranell Patrick, MD           . metoprolol succinate (TOPROL-XL) 50 MG 24 hr tablet [Pharmacy Med Name: METOPROLOL SUCC ER 50 MG TAB] 90 tablet 2    Sig: TAKE 1 TABLET BY MOUTH EVERY DAY     Cardiovascular:  Beta Blockers Passed - 11/24/2017  2:02 AM      Passed - Last BP in normal range    BP Readings from Last 1 Encounters:  10/03/17 132/76         Passed - Last Heart Rate in normal range    Pulse Readings from Last 1 Encounters:  10/03/17 65         Passed - Valid encounter within last 6 months    Recent Outpatient Visits          1 month ago HTN (hypertension), benign   Primary Care at [facility], Fenton Malling, MD   3 months ago Neck pain   Primary Care at [place], [place]   5 months ago Essential hypertension   Primary Care at [facility], Ranell Patrick, MD   6 months ago Essential hypertension   Primary Care at [facility], Ranell Patrick, MD   12 months ago Annual physical exam   Primary Care at [facility], Ranell Patrick, MD

## 2017-12-31 ENCOUNTER — Other Ambulatory Visit: Payer: Self-pay | Admitting: *Deleted

## 2017-12-31 ENCOUNTER — Ambulatory Visit: Payer: 59 | Admitting: Family Medicine

## 2017-12-31 DIAGNOSIS — E785 Hyperlipidemia, unspecified: Secondary | ICD-10-CM

## 2017-12-31 MED ORDER — ROSUVASTATIN CALCIUM 20 MG PO TABS: 20.0000 mg | ORAL_TABLET | Freq: Every day | ORAL | 0 refills | Status: DC

## 2018-01-01 ENCOUNTER — Ambulatory Visit: Payer: 59 | Admitting: Family Medicine

## 2018-02-02 ENCOUNTER — Other Ambulatory Visit: Payer: Self-pay | Admitting: Family Medicine

## 2018-02-02 DIAGNOSIS — E785 Hyperlipidemia, unspecified: Secondary | ICD-10-CM

## 2018-02-02 NOTE — Telephone Encounter (Signed)
Requested Prescriptions  Pending Prescriptions Disp Refills  . rosuvastatin (CRESTOR) 20 MG tablet [Pharmacy Med Name: ROSUVASTATIN CALCIUM 20 MG TAB] 30 tablet 0    Sig: TAKE 1 TABLET BY MOUTH EVERY DAY     Cardiovascular:  Antilipid - Statins Failed - 02/02/2018  1:07 AM      Failed - Total Cholesterol in normal range and within 360 days    Cholesterol, Total  Date Value Ref Range Status  06/18/2017 208 (H) 100 - 199 mg/dL Final         Failed - LDL in normal range and within 360 days    LDL Calculated  Date Value Ref Range Status  06/18/2017 116 (H) 0 - 99 mg/dL Final         Passed - HDL in normal range and within 360 days    HDL  Date Value Ref Range Status  06/18/2017 73 >39 mg/dL Final         Passed - Triglycerides in normal range and within 360 days    Triglycerides  Date Value Ref Range Status  06/18/2017 95 0 - 149 mg/dL Final         Passed - Patient is not pregnant      Passed - Valid encounter within last 12 months    Recent Outpatient Visits          4 months ago HTN (hypertension), benign   Primary Care at [facility], Fenton Malling, MD   5 months ago Neck pain   Primary Care at [place], [place]   7 months ago Essential hypertension   Primary Care at [facility], Ranell Patrick, MD   8 months ago Essential hypertension   Primary Care at [facility], Ranell Patrick, MD   1 year ago Annual physical exam   Primary Care at [facility], Ranell Patrick, MD      Future Appointments            In 1 week Carlota Raspberry Ranell Patrick, MD [facility] at [place], [facility]

## 2018-02-12 ENCOUNTER — Ambulatory Visit: Payer: 59 | Admitting: Family Medicine

## 2018-02-12 ENCOUNTER — Other Ambulatory Visit: Payer: Self-pay

## 2018-02-12 ENCOUNTER — Encounter: Payer: Self-pay | Admitting: Family Medicine

## 2018-02-12 VITALS — BP 131/75 | HR 62 | Temp 97.9°F | Ht 66.0 in | Wt 168.0 lb

## 2018-02-12 DIAGNOSIS — R7303 Prediabetes: Secondary | ICD-10-CM | POA: Diagnosis not present

## 2018-02-12 DIAGNOSIS — I10 Essential (primary) hypertension: Secondary | ICD-10-CM

## 2018-02-12 DIAGNOSIS — N529 Male erectile dysfunction, unspecified: Secondary | ICD-10-CM | POA: Diagnosis not present

## 2018-02-12 DIAGNOSIS — E785 Hyperlipidemia, unspecified: Secondary | ICD-10-CM | POA: Diagnosis not present

## 2018-02-12 MED ORDER — METOPROLOL SUCCINATE ER 50 MG PO TB24: 50.0000 mg | ORAL_TABLET | Freq: Every day | ORAL | 2 refills | Status: DC

## 2018-02-12 MED ORDER — LOSARTAN POTASSIUM 100 MG PO TABS: 100.0000 mg | ORAL_TABLET | Freq: Every day | ORAL | 2 refills | Status: DC

## 2018-02-12 MED ORDER — VIAGRA 100 MG PO TABS: 50.0000 mg | ORAL_TABLET | Freq: Every day | ORAL | 3 refills | Status: DC | PRN

## 2018-02-12 MED ORDER — ROSUVASTATIN CALCIUM 20 MG PO TABS: 20.0000 mg | ORAL_TABLET | Freq: Every day | ORAL | 2 refills | Status: DC

## 2018-02-12 MED ORDER — AMLODIPINE BESYLATE 10 MG PO TABS: 10.0000 mg | ORAL_TABLET | Freq: Every day | ORAL | 2 refills | Status: DC

## 2018-02-12 NOTE — Patient Instructions (Addendum)
  Great work on the weight loss. I suspect the blood sugar test will look better.   No med changes today.   Recheck in 6 months for physical.   If you have lab work done today you will be contacted with your lab results within the next 2 weeks.  If you have not heard from [place] then please contact us. The fastest way to get your results is to register for My Chart.   IF you received an x-ray today, you will receive an invoice from [facility] Radiology. Please contact [facility] Radiology at [phone] with questions or concerns regarding your invoice.   IF you received labwork today, you will receive an invoice from [facility]. Please contact [facility] at [phone] with questions or concerns regarding your invoice.   Our billing staff will not be able to assist you with questions regarding bills from these companies.  You will be contacted with the lab results as soon as they are available. The fastest way to get your results is to activate your My Chart account. Instructions are located on the last page of this paperwork. If you have not heard from [place] regarding the results in 2 weeks, please contact this office.

## 2018-02-12 NOTE — Progress Notes (Signed)
Subjective:    Patient ID: Samuel Macias, male    DOB: 05/25/1955, 63 y.o.   MRN: [id number]  Chief Complaint  Patient presents with  . Chronic conditions    f/u   . Hyperlipidemia    f/u   . Diabetes    HPI  Samuel Macias is a 63 y.o. male who presents to Primary Care at [facility] with a hx of HTN, pre-DM, and HLD.   1. HTN BP Readings from Last 3 Encounters:  02/12/18 131/75  10/03/17 132/76  08/13/17 (!) 153/84   Lab Results  Component Value Date   CREATININE 0.76 06/18/2017   Medications: Toprol XL 50 mg qd, Losartan 100 mg qd, Norvasc(amlodipine) 10 mg qd No side affects with his medications to his knowledge.    2. Pre-DM  Lab Results  Component Value Date   HGBA1C 5.9 (H) 06/18/2017   HGBA1C 5.7 (H) 10/30/2016   HGBA1C 5.9 (H) 04/10/2016   Lab Results  Component Value Date   LDLCALC 116 (H) 06/18/2017   CREATININE 0.76 06/18/2017    Wt Readings from Last 3 Encounters:  02/12/18 168 lb (76.2 kg)  10/03/17 176 lb (79.8 kg)  08/13/17 181 lb 6.4 oz (82.3 kg)   Has been working on diet and activity.   3. HLD Lab Results  Component Value Date   CHOL 208 (H) 06/18/2017   HDL 73 06/18/2017   LDLCALC 116 (H) 06/18/2017   TRIG 95 06/18/2017   CHOLHDL 2.8 06/18/2017   Lab Results  Component Value Date   ALT 27 06/18/2017   AST 26 06/18/2017   ALKPHOS 47 06/18/2017   BILITOT 0.5 06/18/2017   Medications: Crestor(rosuvastatin) 20 mg qd No new muscle aches  4. Erectile Dysfunction  No change in hearing. No chest pains.  Several weeks ago, he saw a "purplish/blue spot" in his vision.  No chest pains or shortness of breath with exercise.    Review of Systems  Constitutional: Negative for fatigue and unexpected weight change.  Eyes: Negative for visual disturbance.  Respiratory: Negative for cough, chest tightness and shortness of breath.   Cardiovascular: Negative for chest pain, palpitations and leg swelling.  Gastrointestinal: Negative  for abdominal pain and blood in stool.  Neurological: Negative for dizziness, light-headedness and headaches.       Objective:   Physical Exam Vitals signs reviewed.  Constitutional:      Appearance: He is well-developed.  HENT:     Head: Normocephalic and atraumatic.  Eyes:     Pupils: Pupils are equal, round, and reactive to light.  Neck:     Vascular: No carotid bruit or JVD.  Cardiovascular:     Rate and Rhythm: Normal rate and regular rhythm.     Heart sounds: Normal heart sounds. No murmur.  Pulmonary:     Effort: Pulmonary effort is normal.     Breath sounds: Normal breath sounds. No rales.  Skin:    General: Skin is warm and dry.  Neurological:     Mental Status: He is alert and oriented to person, place, and time.       Assessment & Plan:   Samuel Macias is a 63 y.o. male Prediabetes - Plan: Hemoglobin A1c  -Suspect this will be better with weight changes.  Continue diet and exercise approach.  Commended on his efforts.  Hyperlipidemia, unspecified hyperlipidemia type - Plan: Lipid panel, Comprehensive metabolic panel, rosuvastatin (CRESTOR) 20 MG tablet  -  Stable, tolerating current regimen.  Medications refilled. Labs pending as above.   Essential hypertension - Plan: amLODipine (NORVASC) 10 MG tablet, losartan (COZAAR) 100 MG tablet, metoprolol succinate (TOPROL-XL) 50 MG 24 hr tablet  -  Stable, tolerating current regimen. Medications refilled. Labs pending as above.   Erectile dysfunction, unspecified erectile dysfunction type - Plan: VIAGRA 100 MG tablet  - viagra Rx given - use lowest effective dose. Side effects discussed (including but not limited to headache/flushing, blue discoloration of vision, possible vascular steal and risk of cardiac effects if underlying unknown coronary artery disease, and permanent sensorineural hearing loss). Understanding expressed.   Meds ordered this encounter  Medications  . amLODipine (NORVASC) 10 MG tablet     Sig: Take 1 tablet (10 mg total) by mouth daily.    Dispense:  90 tablet    Refill:  2  . losartan (COZAAR) 100 MG tablet    Sig: Take 1 tablet (100 mg total) by mouth daily.    Dispense:  90 tablet    Refill:  2  . metoprolol succinate (TOPROL-XL) 50 MG 24 hr tablet    Sig: Take 1 tablet (50 mg total) by mouth daily. Take with or immediately following a meal.    Dispense:  90 tablet    Refill:  2  . rosuvastatin (CRESTOR) 20 MG tablet    Sig: Take 1 tablet (20 mg total) by mouth daily.    Dispense:  90 tablet    Refill:  2  . VIAGRA 100 MG tablet    Sig: Take 0.5-1 tablets (50-100 mg total) by mouth daily as needed for erectile dysfunction.    Dispense:  5 tablet    Refill:  3   Patient Instructions    Great work on the weight loss. I suspect the blood sugar test will look better.   No med changes today.   Recheck in 6 months for physical.   If you have lab work done today you will be contacted with your lab results within the next 2 weeks.  If you have not heard from [place] then please contact us. The fastest way to get your results is to register for My Chart.   IF you received an x-ray today, you will receive an invoice from [facility]. Please contact [facility] at [phone] with questions or concerns regarding your invoice.   IF you received labwork today, you will receive an invoice from [place]. Please contact [facility] at [phone] with questions or concerns regarding your invoice.   Our billing staff will not be able to assist you with questions regarding bills from these companies.  You will be contacted with the lab results as soon as they are available. The fastest way to get your results is to activate your My Chart account. Instructions are located on the last page of this paperwork. If you have not heard from [place] regarding the results in 2 weeks, please contact this office.       I personally performed the services described in this  documentation, which was scribed in my presence. The recorded information has been reviewed and considered for accuracy and completeness, addended by me as needed, and agree with information above.  Signed,   [name], MD Primary Care at [place].  02/14/18 5:25 PM     I, Franchot Erichsen, acting as a Education administrator for Wendie Agreste, MD, have documented all relevant documentation on the behalf of Wendie Agreste, MD, as directed by Ranell Patrick  Carlota Raspberry, MD while in the presence of Wendie Agreste, MD.

## 2018-02-13 LAB — HEMOGLOBIN A1C
ESTIMATED AVERAGE GLUCOSE: 134 mg/dL
Hgb A1c MFr Bld: 6.3 % — ABNORMAL HIGH (ref 4.8–5.6)

## 2018-02-13 LAB — COMPREHENSIVE METABOLIC PANEL
ALT: 35 IU/L (ref 0–44)
AST: 21 IU/L (ref 0–40)
Albumin/Globulin Ratio: 1.8 (ref 1.2–2.2)
Albumin: 4.4 g/dL (ref 3.6–4.8)
Alkaline Phosphatase: 51 IU/L (ref 39–117)
BUN/Creatinine Ratio: 11 (ref 10–24)
BUN: 9 mg/dL (ref 8–27)
Bilirubin Total: 0.4 mg/dL (ref 0.0–1.2)
CALCIUM: 9.4 mg/dL (ref 8.6–10.2)
CO2: 23 mmol/L (ref 20–29)
CREATININE: 0.82 mg/dL (ref 0.76–1.27)
Chloride: 100 mmol/L (ref 96–106)
GFR, EST AFRICAN AMERICAN: 109 mL/min/{1.73_m2} (ref >59)
GFR, EST NON AFRICAN AMERICAN: 94 mL/min/{1.73_m2} (ref >59)
GLOBULIN, TOTAL: 2.4 g/dL (ref 1.5–4.5)
Glucose: 97 mg/dL (ref 65–99)
Potassium: 4.1 mmol/L (ref 3.5–5.2)
Sodium: 138 mmol/L (ref 134–144)
TOTAL PROTEIN: 6.8 g/dL (ref 6.0–8.5)

## 2018-02-13 LAB — LIPID PANEL
CHOLESTEROL TOTAL: 159 mg/dL (ref 100–199)
Chol/HDL Ratio: 3.5 ratio (ref 0.0–5.0)
HDL: 45 mg/dL (ref >39)
LDL Calculated: 92 mg/dL (ref 0–99)
TRIGLYCERIDES: 109 mg/dL (ref 0–149)
VLDL Cholesterol Cal: 22 mg/dL (ref 5–40)

## 2018-02-14 ENCOUNTER — Encounter: Payer: Self-pay | Admitting: Family Medicine

## 2018-02-16 ENCOUNTER — Other Ambulatory Visit: Payer: Self-pay | Admitting: Family Medicine

## 2018-02-16 DIAGNOSIS — N529 Male erectile dysfunction, unspecified: Secondary | ICD-10-CM

## 2018-02-16 MED ORDER — SILDENAFIL CITRATE 20 MG PO TABS: 20.0000 mg | ORAL_TABLET | Freq: Every day | ORAL | 3 refills | Status: AC | PRN

## 2018-02-16 NOTE — Telephone Encounter (Signed)
Please advise 

## 2018-02-16 NOTE — Telephone Encounter (Signed)
Sildenafil 20mg  sent in place of Viagra.  Let me know if this is not what he was referring to.

## 2018-02-16 NOTE — Telephone Encounter (Signed)
Copied from [place] [id number]. Topic: Quick Communication - Rx Refill/Question >> Feb 16, 2018  9:07 AM Lionel December wrote: Medication: VIAGRA 100 MG tablet  Has the patient contacted their pharmacy? Yes.   (Agent: If no, request that the patient contact the pharmacy for the refill.) (Agent: If yes, when and what did the pharmacy advise?)  Preferred Pharmacy (with phone number or street name): [id number] [place], [place]. [phone] (Phone) [phone] (Fax)  PT NEEDS GENERIC BRAND 100MG  CALLED IN BECAUSE INSURANCE INSURANCE WONT PAY FOR NAME BRAND  Agent: Please be advised that RX refills may take up to 3 business days. We ask that you follow-up with your pharmacy.

## 2018-02-16 NOTE — Telephone Encounter (Signed)
Will route to office for final disposition; see CRM # [phone]; pt last seen by Dr Carlota Raspberry 02/12/2018.

## 2018-02-19 DIAGNOSIS — L570 Actinic keratosis: Secondary | ICD-10-CM | POA: Diagnosis not present

## 2018-02-19 DIAGNOSIS — L821 Other seborrheic keratosis: Secondary | ICD-10-CM | POA: Diagnosis not present

## 2018-02-19 DIAGNOSIS — D225 Melanocytic nevi of trunk: Secondary | ICD-10-CM | POA: Diagnosis not present

## 2018-02-19 DIAGNOSIS — L814 Other melanin hyperpigmentation: Secondary | ICD-10-CM | POA: Diagnosis not present

## 2018-03-18 ENCOUNTER — Other Ambulatory Visit: Payer: Self-pay | Admitting: Family Medicine

## 2018-03-18 DIAGNOSIS — E785 Hyperlipidemia, unspecified: Secondary | ICD-10-CM

## 2018-03-18 MED ORDER — ROSUVASTATIN CALCIUM 20 MG PO TABS: 20.0000 mg | ORAL_TABLET | Freq: Every day | ORAL | 3 refills | Status: DC

## 2018-03-18 NOTE — Telephone Encounter (Signed)
Copied from [place] [id number]. Topic: Quick Communication - Rx Refill/Question >> Mar 18, 2018  2:40 PM Ahmed Prima L wrote: Medication: rosuvastatin (CRESTOR) 20 MG tablet - he is out, would like 90 day supply  Has the patient contacted their pharmacy? Yes (Agent: If no, request that the patient contact the pharmacy for the refill.) (Agent: If yes, when and what did the pharmacy advised  Preferred Pharmacy (with phone number or street name): [place] [place] [facility] [address] [place] [address] [place] [address] Phone: [phone] Fax: [phone]    Agent: Please be advised that RX refills may take up to 3 business days. We ask that you follow-up with your pharmacy.

## 2018-03-18 NOTE — Telephone Encounter (Signed)
Patient called and advised Crestor was sent to [place] on 02/12/18 #90/2 refills, I asked did he pick any up. He says that he did not and that he will wait until the 90 day comes from [facility], because they are usually quick to deliver, in 4-5 days.

## 2018-04-08 ENCOUNTER — Other Ambulatory Visit: Payer: Self-pay | Admitting: Family Medicine

## 2018-04-08 DIAGNOSIS — E785 Hyperlipidemia, unspecified: Secondary | ICD-10-CM

## 2018-04-11 ENCOUNTER — Other Ambulatory Visit: Payer: Self-pay | Admitting: Family Medicine

## 2018-04-27 ENCOUNTER — Other Ambulatory Visit: Payer: Self-pay | Admitting: Family Medicine

## 2018-05-01 ENCOUNTER — Other Ambulatory Visit: Payer: Self-pay | Admitting: Family Medicine

## 2018-05-01 DIAGNOSIS — I10 Essential (primary) hypertension: Secondary | ICD-10-CM

## 2018-05-17 ENCOUNTER — Other Ambulatory Visit: Payer: Self-pay | Admitting: Family Medicine

## 2018-05-17 DIAGNOSIS — I10 Essential (primary) hypertension: Secondary | ICD-10-CM

## 2018-06-21 ENCOUNTER — Other Ambulatory Visit: Payer: Self-pay | Admitting: Family Medicine

## 2018-06-22 ENCOUNTER — Telehealth: Payer: Self-pay | Admitting: Family Medicine

## 2018-06-22 NOTE — Telephone Encounter (Signed)
Copied from [place] [id number]. Topic: Quick Communication - Rx Refill/Question >> Jun 22, 2018 10:07 AM Leward Quan A wrote: Medication: rosuvastatin (CRESTOR) 20 MG tablet, metoprolol succinate (TOPROL-XL) 50 MG 24 hr tablet, losartan (COZAAR) 100 MG tablet, amLODipine (NORVASC) 10 MG tablet   Has the patient contacted their pharmacy? Yes.   (Agent: If no, request that the patient contact the pharmacy for the refill.) (Agent: If yes, when and what did the pharmacy advise?)  Preferred Pharmacy (with phone number or street name): [place], [place] [phone] (Phone) [phone] (Fax)    Agent: Please be advised that RX refills may take up to 3 business days. We ask that you follow-up with your pharmacy.

## 2018-06-24 ENCOUNTER — Other Ambulatory Visit: Payer: Self-pay | Admitting: Emergency Medicine

## 2018-06-24 DIAGNOSIS — I10 Essential (primary) hypertension: Secondary | ICD-10-CM

## 2018-06-24 DIAGNOSIS — E785 Hyperlipidemia, unspecified: Secondary | ICD-10-CM

## 2018-06-24 MED ORDER — LOSARTAN POTASSIUM 100 MG PO TABS: 100.0000 mg | ORAL_TABLET | Freq: Every day | ORAL | 2 refills | Status: DC

## 2018-06-24 MED ORDER — METOPROLOL SUCCINATE ER 50 MG PO TB24: 50.0000 mg | ORAL_TABLET | Freq: Every day | ORAL | 2 refills | Status: DC

## 2018-06-24 MED ORDER — ROSUVASTATIN CALCIUM 20 MG PO TABS: 20.0000 mg | ORAL_TABLET | Freq: Every day | ORAL | 3 refills | Status: DC

## 2018-06-30 ENCOUNTER — Other Ambulatory Visit: Payer: Self-pay

## 2018-06-30 ENCOUNTER — Ambulatory Visit: Payer: 59 | Admitting: Family Medicine

## 2018-06-30 ENCOUNTER — Encounter: Payer: Self-pay | Admitting: Family Medicine

## 2018-06-30 VITALS — BP 140/82 | HR 70 | Temp 98.1°F | Ht 66.0 in | Wt 170.0 lb

## 2018-06-30 DIAGNOSIS — M79651 Pain in right thigh: Secondary | ICD-10-CM

## 2018-06-30 DIAGNOSIS — L02415 Cutaneous abscess of right lower limb: Secondary | ICD-10-CM | POA: Diagnosis not present

## 2018-06-30 MED ORDER — DOXYCYCLINE HYCLATE 100 MG PO TABS: 100.0000 mg | ORAL_TABLET | Freq: Two times a day (BID) | ORAL | 0 refills | Status: DC

## 2018-06-30 NOTE — Progress Notes (Signed)
Subjective:    Patient ID: Samuel Macias, male    DOB: 08-20-1955, 63 y.o.   MRN: [id number]  HPI Samuel Macias is a 63 y.o. male Presents today for: Chief Complaint  Patient presents with  . Insect Bite     had a few bumps on the inner right thigh. Then boil on inner thigh. Do not drain, is not itchy, little sensitive but no pain.   Tick removed from neck 3 days ago. Removed easily, there less than hours. No rash, fever, HA.  No further issues in area of tick.   Woke up 2 days ago with boil on inside of R thigh.  Peroxide, then needle to area - some discharge - ? Clear. Some d/c has remained. Same size. No fever, but redness surrounding area - seems to be in same area as yesterday.  No fever, feels ok otherwise. No known bites.  Did notice 3 red spots in area 1 week ago, resolved on own in few days.  Tx: peroxide. No oral meds, no pain treatment. Only uncomfortable. No heat/ice.    Patient Active Problem List   Diagnosis Date Noted  . Symptomatic bradycardia 04/25/2016  . Dizziness 03/17/2016  . Cough 04/26/2015  . GERD (gastroesophageal reflux disease) 04/26/2015  . Laryngopharyngeal reflux (LPR) 04/26/2015  . Hypertension 03/01/2011  . Hyperlipidemia 03/01/2011  . Personal history of colonic polyps - adenomas 11/05/2006   Past Medical History:  Diagnosis Date  . Hyperlipidemia   . Hypertension   . Personal history of colonic polyps - adenomas 11/05/2006   Past Surgical History:  Procedure Laterality Date  . EXTERNAL EAR SURGERY     as child  . pain block     Herniated disc repair  . WRIST SURGERY     tendon   No Known Allergies Prior to Admission medications   Medication Sig Start Date End Date Taking? Authorizing Provider  amLODipine (NORVASC) 10 MG tablet Take 1 tablet (10 mg total) by mouth daily. 02/12/18  Yes Wendie Agreste, MD  amLODipine (NORVASC) 2.5 MG tablet TAKE 1 TABLET BY MOUTH  DAILY 06/21/18  Yes Wendie Agreste, MD  aspirin 81 MG  tablet Take 81 mg by mouth daily.   Yes [provider]  cyclobenzaprine (FLEXERIL) 5 MG tablet Take 1 tablet (5 mg total) by mouth 3 (three) times daily as needed. 08/13/17  Yes Jaynee Eagles, PA-C  losartan (COZAAR) 100 MG tablet Take 1 tablet (100 mg total) by mouth daily. 06/24/18  Yes Wendie Agreste, MD  metoprolol succinate (TOPROL-XL) 50 MG 24 hr tablet Take 1 tablet (50 mg total) by mouth daily. Take with or immediately following a meal. 06/24/18  Yes Wendie Agreste, MD  Omega-3 Fatty Acids (FISH OIL) 1200 MG CAPS Take 1 capsule by mouth daily.   Yes [provider]  rosuvastatin (CRESTOR) 20 MG tablet Take 1 tablet (20 mg total) by mouth daily. 06/24/18  Yes Wendie Agreste, MD  sildenafil (REVATIO) 20 MG tablet Take 1-3 tablets (20-60 mg total) by mouth daily as needed. 02/16/18  Yes Wendie Agreste, MD   Social History   Socioeconomic History  . Marital status: Married    Spouse name: [place]  . Number of children: 2  . Years of education: 12th grade  . Highest education level: Not on file  Occupational History  . Occupation: Education officer, community for [employer]  . Financial resource strain: Not on file  .  Food insecurity:    Worry: Not on file    Inability: Not on file  . Transportation needs:    Medical: Not on file    Non-medical: Not on file  Tobacco Use  . Smoking status: Former Smoker    Packs/day: 0.40    Years: 5.00    Pack years: 2.00    Types: Cigarettes    Last attempt to quit: 01/27/1981    Years since quitting: 37.4  . Smokeless tobacco: Former Systems developer    Types: [place] date: 01/27/1981  Substance and Sexual Activity  . Alcohol use: Yes    Alcohol/week: 12.0 standard drinks    Types: 12 Standard drinks or equivalent per week    Comment: 12 pack weekly  . Drug use: No  . Sexual activity: Yes  Lifestyle  . Physical activity:    Days per week: Not on file    Minutes per session: Not on file  . Stress: Not on file   Relationships  . Social connections:    Talks on phone: Not on file    Gets together: Not on file    Attends religious service: Not on file    Active member of club or organization: Not on file    Attends meetings of clubs or organizations: Not on file    Relationship status: Not on file  . Intimate partner violence:    Fear of current or ex partner: Not on file    Emotionally abused: Not on file    Physically abused: Not on file    Forced sexual activity: Not on file  Other Topics Concern  . Not on file  Social History Narrative   Lives with his wife and son, [name]. His daughter lives independently in [place], [place].   Education: [employer]   Exercise: Yes    Review of Systems Per HPI.     Objective:   Physical Exam Vitals signs reviewed.  Constitutional:      General: He is not in acute distress.    Appearance: He is well-developed.  HENT:     Head: Normocephalic and atraumatic.  Cardiovascular:     Rate and Rhythm: Normal rate.  Pulmonary:     Effort: Pulmonary effort is normal.  Lymphadenopathy:     Lower Body: No right inguinal adenopathy.  Skin:      Neurological:     Mental Status: He is alert and oriented to person, place, and time.     Vitals:   06/30/18 1105  BP: 140/82  Pulse: 70  Temp: 98.1 F (36.7 C)  TempSrc: Oral  SpO2: 98%  Weight: 170 lb (77.1 kg)  Height: 5\' 6"  (1.676 m)         Assessment & Plan:    Samuel Macias is a 63 y.o. male Abscess of right thigh - Plan: WOUND CULTURE, doxycycline (VIBRA-TABS) 100 MG tablet  Pain in right thigh - Plan: WOUND CULTURE, doxycycline (VIBRA-TABS) 100 MG tablet  Abscess right medial thigh, already open and draining without central fluctuance, decided against incision and drainage at this time.  Some surrounding cellulitis noted.  No systemic symptoms.  -Erythema outlined, bandage applied, culture obtained.  -Doxycycline 100 mg twice daily, potential side effects discussed.  -Warm  compresses or soaks 4-5 times per day, followed by gentle pressure to express exudate  -Recheck in 48 hours to decide on incision and drainage, sooner if worse.  Meds ordered this encounter  Medications  . doxycycline (  VIBRA-TABS) 100 MG tablet    Sig: Take 1 tablet (100 mg total) by mouth 2 (two) times daily.    Dispense:  20 tablet    Refill:  0   Patient Instructions    Warm compresses over area 4-5 times per day followed by gentle pressure to express pus.  Start doxycycline 1 pill twice per day.  Recheck in 2 days to determine if incision or drainage is needed at that time.  If any fevers or acute worsening symptoms sooner, be seen here or the emergency room.   Skin Abscess  A skin abscess is an infected area on or under your skin that contains a collection of pus and other material. An abscess may also be called a furuncle, carbuncle, or boil. An abscess can occur in or on almost any part of your body. Some abscesses break open (rupture) on their own. Most continue to get worse unless they are treated. The infection can spread deeper into the body and eventually into your blood, which can make you feel ill. Treatment usually involves draining the abscess. What are the causes? An abscess occurs when germs, like bacteria, pass through your skin and cause an infection. This may be caused by:  A scrape or cut on your skin.  A puncture wound through your skin, including a needle injection or insect bite.  Blocked oil or sweat glands.  Blocked and infected hair follicles.  A cyst that forms beneath your skin (sebaceous cyst) and becomes infected. What increases the risk? This condition is more likely to develop in people who:  Have a weak body defense system (immune system).  Have diabetes.  Have dry and irritated skin.  Get frequent injections or use illegal IV drugs.  Have a foreign body in a wound, such as a splinter.  Have problems with their lymph system or veins.  What are the signs or symptoms? Symptoms of this condition include:  A painful, firm bump under the skin.  A bump with pus at the top. This may break through the skin and drain. Other symptoms include:  Redness surrounding the abscess site.  Warmth.  Swelling of the lymph nodes (glands) near the abscess.  Tenderness.  A sore on the skin. How is this diagnosed? This condition may be diagnosed based on:  A physical exam.  Your medical history.  A sample of pus. This may be used to find out what is causing the infection.  Blood tests.  Imaging tests, such as an ultrasound, CT scan, or MRI. How is this treated? A small abscess that drains on its own may not need treatment. Treatment for larger abscesses may include:  Moist heat or heat pack applied to the area several times a day.  A procedure to drain the abscess (incision and drainage).  Antibiotic medicines. For a severe abscess, you may first get antibiotics through an IV and then change to antibiotics by mouth. Follow these instructions at home: Medicines   Take over-the-counter and prescription medicines only as told by your health care provider.  If you were prescribed an antibiotic medicine, take it as told by your health care provider. Do not stop taking the antibiotic even if you start to feel better. Abscess care   If you have an abscess that has not drained, apply heat to the affected area. Use the heat source that your health care provider recommends, such as a moist heat pack or a heating pad. ? Place a towel between your skin  and the heat source. ? Leave the heat on for 20-30 minutes. ? Remove the heat if your skin turns bright red. This is especially important if you are unable to feel pain, heat, or cold. You may have a greater risk of getting burned.  Follow instructions from your health care provider about how to take care of your abscess. Make sure you: ? Cover the abscess with a bandage  (dressing). ? Change your dressing or gauze as told by your health care provider. ? Wash your hands with soap and water before you change the dressing or gauze. If soap and water are not available, use hand sanitizer.  Check your abscess every day for signs of a worsening infection. Check for: ? More redness, swelling, or pain. ? More fluid or blood. ? Warmth. ? More pus or a bad smell. General instructions  To avoid spreading the infection: ? Do not share personal care items, towels, or hot tubs with others. ? Avoid making skin contact with other people.  Keep all follow-up visits as told by your health care provider. This is important. Contact a health care provider if you have:  More redness, swelling, or pain around your abscess.  More fluid or blood coming from your abscess.  Warm skin around your abscess.  More pus or a bad smell coming from your abscess.  A fever.  Muscle aches.  Chills or a general ill feeling. Get help right away if you:  Have severe pain.  See red streaks on your skin spreading away from the abscess. Summary  A skin abscess is an infected area on or under your skin that contains a collection of pus and other material.  A small abscess that drains on its own may not need treatment.  Treatment for larger abscesses may include having a procedure to drain the abscess and taking an antibiotic. This information is not intended to replace advice given to you by your health care provider. Make sure you discuss any questions you have with your health care provider. Document Released: 10/23/2004 Document Revised: 02/26/2017 Document Reviewed: 02/26/2017 Elsevier Interactive Patient Education  [employer].     If you have lab work done today you will be contacted with your lab results within the next 2 weeks.  If you have not heard from [place] then please contact us. The fastest way to get your results is to register for My Chart.   IF you  received an x-ray today, you will receive an invoice from [facility]. Please contact [facility] at [phone] with questions or concerns regarding your invoice.   IF you received labwork today, you will receive an invoice from [facility]. Please contact [facility] at [phone] with questions or concerns regarding your invoice.   Our billing staff will not be able to assist you with questions regarding bills from these companies.  You will be contacted with the lab results as soon as they are available. The fastest way to get your results is to activate your My Chart account. Instructions are located on the last page of this paperwork. If you have not heard from [place] regarding the results in 2 weeks, please contact this office.       Signed,   [name], MD Primary Care at [place].  06/30/18 12:44 PM

## 2018-06-30 NOTE — Patient Instructions (Signed)
Warm compresses over area 4-5 times per day followed by gentle pressure to express pus.  Start doxycycline 1 pill twice per day.  Recheck in 2 days to determine if incision or drainage is needed at that time.  If any fevers or acute worsening symptoms sooner, be seen here or the emergency room.   Skin Abscess  A skin abscess is an infected area on or under your skin that contains a collection of pus and other material. An abscess may also be called a furuncle, carbuncle, or boil. An abscess can occur in or on almost any part of your body. Some abscesses break open (rupture) on their own. Most continue to get worse unless they are treated. The infection can spread deeper into the body and eventually into your blood, which can make you feel ill. Treatment usually involves draining the abscess. What are the causes? An abscess occurs when germs, like bacteria, pass through your skin and cause an infection. This may be caused by:  A scrape or cut on your skin.  A puncture wound through your skin, including a needle injection or insect bite.  Blocked oil or sweat glands.  Blocked and infected hair follicles.  A cyst that forms beneath your skin (sebaceous cyst) and becomes infected. What increases the risk? This condition is more likely to develop in people who:  Have a weak body defense system (immune system).  Have diabetes.  Have dry and irritated skin.  Get frequent injections or use illegal IV drugs.  Have a foreign body in a wound, such as a splinter.  Have problems with their lymph system or veins. What are the signs or symptoms? Symptoms of this condition include:  A painful, firm bump under the skin.  A bump with pus at the top. This may break through the skin and drain. Other symptoms include:  Redness surrounding the abscess site.  Warmth.  Swelling of the lymph nodes (glands) near the abscess.  Tenderness.  A sore on the skin. How is this diagnosed? This  condition may be diagnosed based on:  A physical exam.  Your medical history.  A sample of pus. This may be used to find out what is causing the infection.  Blood tests.  Imaging tests, such as an ultrasound, CT scan, or MRI. How is this treated? A small abscess that drains on its own may not need treatment. Treatment for larger abscesses may include:  Moist heat or heat pack applied to the area several times a day.  A procedure to drain the abscess (incision and drainage).  Antibiotic medicines. For a severe abscess, you may first get antibiotics through an IV and then change to antibiotics by mouth. Follow these instructions at home: Medicines   Take over-the-counter and prescription medicines only as told by your health care provider.  If you were prescribed an antibiotic medicine, take it as told by your health care provider. Do not stop taking the antibiotic even if you start to feel better. Abscess care   If you have an abscess that has not drained, apply heat to the affected area. Use the heat source that your health care provider recommends, such as a moist heat pack or a heating pad. ? Place a towel between your skin and the heat source. ? Leave the heat on for 20-30 minutes. ? Remove the heat if your skin turns bright red. This is especially important if you are unable to feel pain, heat, or cold. You may have a  greater risk of getting burned.  Follow instructions from your health care provider about how to take care of your abscess. Make sure you: ? Cover the abscess with a bandage (dressing). ? Change your dressing or gauze as told by your health care provider. ? Wash your hands with soap and water before you change the dressing or gauze. If soap and water are not available, use hand sanitizer.  Check your abscess every day for signs of a worsening infection. Check for: ? More redness, swelling, or pain. ? More fluid or blood. ? Warmth. ? More pus or a bad  smell. General instructions  To avoid spreading the infection: ? Do not share personal care items, towels, or hot tubs with others. ? Avoid making skin contact with other people.  Keep all follow-up visits as told by your health care provider. This is important. Contact a health care provider if you have:  More redness, swelling, or pain around your abscess.  More fluid or blood coming from your abscess.  Warm skin around your abscess.  More pus or a bad smell coming from your abscess.  A fever.  Muscle aches.  Chills or a general ill feeling. Get help right away if you:  Have severe pain.  See red streaks on your skin spreading away from the abscess. Summary  A skin abscess is an infected area on or under your skin that contains a collection of pus and other material.  A small abscess that drains on its own may not need treatment.  Treatment for larger abscesses may include having a procedure to drain the abscess and taking an antibiotic. This information is not intended to replace advice given to you by your health care provider. Make sure you discuss any questions you have with your health care provider. Document Released: 10/23/2004 Document Revised: 02/26/2017 Document Reviewed: 02/26/2017 Elsevier Interactive Patient Education  [employer].     If you have lab work done today you will be contacted with your lab results within the next 2 weeks.  If you have not heard from [place] then please contact us. The fastest way to get your results is to register for My Chart.   IF you received an x-ray today, you will receive an invoice from [facility]. Please contact [facility] at [phone] with questions or concerns regarding your invoice.   IF you received labwork today, you will receive an invoice from [place]. Please contact [facility] at [phone] with questions or concerns regarding your invoice.   Our billing staff will not be able to  assist you with questions regarding bills from these companies.  You will be contacted with the lab results as soon as they are available. The fastest way to get your results is to activate your My Chart account. Instructions are located on the last page of this paperwork. If you have not heard from [place] regarding the results in 2 weeks, please contact this office.

## 2018-07-02 ENCOUNTER — Ambulatory Visit: Payer: 59 | Admitting: Family Medicine

## 2018-07-02 ENCOUNTER — Other Ambulatory Visit: Payer: Self-pay

## 2018-07-02 ENCOUNTER — Encounter: Payer: Self-pay | Admitting: Family Medicine

## 2018-07-02 VITALS — BP 140/80 | HR 79 | Temp 98.3°F | Resp 14 | Ht 66.0 in | Wt 170.0 lb

## 2018-07-02 DIAGNOSIS — L02415 Cutaneous abscess of right lower limb: Secondary | ICD-10-CM | POA: Diagnosis not present

## 2018-07-02 NOTE — Patient Instructions (Addendum)
  Continue warm compresses 4-5 times per day, cleanse over affected area few times per day with soap and water, okay if the top scab opens to allow gentle pressure to express pus.  Continue doxycycline twice per day.  Follow-up with me on Monday afternoon for telemedicine visit.  Potentially can do a video visit at that time as well.   Return to the clinic or go to the nearest emergency room if any of your symptoms worsen or new symptoms occur.    If you have lab work done today you will be contacted with your lab results within the next 2 weeks.  If you have not heard from [place] then please contact us. The fastest way to get your results is to register for My Chart.   IF you received an x-ray today, you will receive an invoice from [facility]. Please contact [facility] at [phone] with questions or concerns regarding your invoice.   IF you received labwork today, you will receive an invoice from [place]. Please contact [facility] at [phone] with questions or concerns regarding your invoice.   Our billing staff will not be able to assist you with questions regarding bills from these companies.  You will be contacted with the lab results as soon as they are available. The fastest way to get your results is to activate your My Chart account. Instructions are located on the last page of this paperwork. If you have not heard from [place] regarding the results in 2 weeks, please contact this office.

## 2018-07-02 NOTE — Progress Notes (Signed)
Subjective:    Patient ID: Samuel Macias, male    DOB: Jun 18, 1955, 63 y.o.   MRN: [id number]  HPI MERRICK MAGGIO is a 63 y.o. male Presents today for: Chief Complaint  Patient presents with  . Recurrent Skin Infections    patient was last seen here on 06/30/2018 for abscess of right thigh along with possible bug bite. PAtient was informed to try warm compresses over area 4-5 time a day. Patient started docycline and told to come back today to f/u and possible drain abcess if no better. Patient stated area is doing much better. Area has gone down alot. and area is not as painful   Here for follow up of inner thigh R abscess.  Started doxycycline and warm compresses, I and D deferred as open already and draining.   Warm compresses 4-5 times per day, some exudate expressed afterwards.  Getting better.  Swelling improving.  Redness receeding.  No fever.  Culture - staph aureus, sensitivity pending.    Results for orders placed or performed in visit on 06/30/18  WOUND CULTURE  Result Value Ref Range   Gram Stain Result Final report    Organism ID, Bacteria Comment    Organism ID, Bacteria No organisms seen    Aerobic Bacterial Culture Preliminary report (A)    Organism ID, Bacteria Staphylococcus aureus (A)      Review of Systems Per HPI.     Objective:   Physical Exam Constitutional:      General: He is not in acute distress.    Appearance: He is well-developed.  HENT:     Head: Normocephalic and atraumatic.  Cardiovascular:     Rate and Rhythm: Normal rate.  Pulmonary:     Effort: Pulmonary effort is normal.  Skin:      Neurological:     Mental Status: He is alert and oriented to person, place, and time.    Vitals:   07/02/18 1620  BP: 140/80  Pulse: 79  Resp: 14  Temp: 98.3 F (36.8 C)  TempSrc: Oral  SpO2: 99%  Weight: 170 lb (77.1 kg)  Height: 5\' 6"  (1.676 m)          Assessment & Plan:  Samuel Macias is a 63 y.o. male Abscess of  right thigh  -Staff infection with abscess, improving with warm compresses, doxycycline.   - Cleansing of area to allow opening of the thin eschar/crusting with gentle pressure after warm compresses to express exudate.  Still some induration, possible deeper abscess or tunnels but overall is improving.  -Continue doxycycline, warm compresses, gentle pressure, recheck by telemedicine visit in 3 days, sooner if worse.  Sensitivities pending on culture.  No orders of the defined types were placed in this encounter.  Patient Instructions    Continue warm compresses 4-5 times per day, cleanse over affected area few times per day with soap and water, okay if the top scab opens to allow gentle pressure to express pus.  Continue doxycycline twice per day.  Follow-up with me on Monday afternoon for telemedicine visit.  Potentially can do a video visit at that time as well.   Return to the clinic or go to the nearest emergency room if any of your symptoms worsen or new symptoms occur.    If you have lab work done today you will be contacted with your lab results within the next 2 weeks.  If you have not heard from [place] then please contact us. The  fastest way to get your results is to register for My Chart.   IF you received an x-ray today, you will receive an invoice from [facility]. Please contact [facility]iology at [phone] with questions or concerns regarding your invoice.   IF you received labwork today, you will receive an invoice from [place]. Please contact [facility] at [phone] with questions or concerns regarding your invoice.   Our billing staff will not be able to assist you with questions regarding bills from these companies.  You will be contacted with the lab results as soon as they are available. The fastest way to get your results is to activate your My Chart account. Instructions are located on the last page of this paperwork. If you have not heard from [place]  regarding the results in 2 weeks, please contact this office.       Signed,   [name], MD [facility] [place].  07/02/18 6:02 PM

## 2018-07-03 LAB — WOUND CULTURE: Organism ID, Bacteria: NONE SEEN

## 2018-07-05 ENCOUNTER — Telehealth: Payer: 59 | Admitting: Family Medicine

## 2018-07-07 ENCOUNTER — Ambulatory Visit: Payer: 59 | Admitting: Family Medicine

## 2018-07-14 ENCOUNTER — Ambulatory Visit: Payer: 59 | Admitting: Family Medicine

## 2018-07-14 ENCOUNTER — Encounter: Payer: Self-pay | Admitting: Family Medicine

## 2018-07-14 ENCOUNTER — Other Ambulatory Visit: Payer: Self-pay

## 2018-07-14 VITALS — BP 140/65 | HR 82 | Temp 98.9°F | Resp 14 | Wt 167.6 lb

## 2018-07-14 DIAGNOSIS — L02415 Cutaneous abscess of right lower limb: Secondary | ICD-10-CM

## 2018-07-14 NOTE — Patient Instructions (Addendum)
  Slight firmness may be due to scar tissue.  Area looks great today.  You can hold off on warm compresses at this point but if any increased size of the firm area or worsening symptoms, return right away.  Thanks for coming in today.    If you have lab work done today you will be contacted with your lab results within the next 2 weeks.  If you have not heard from [place] then please contact us. The fastest way to get your results is to register for My Chart.   IF you received an x-ray today, you will receive an invoice from [facility]. Please contact [facility] at [phone] with questions or concerns regarding your invoice.   IF you received labwork today, you will receive an invoice from [place]. Please contact [facility] at [phone] with questions or concerns regarding your invoice.   Our billing staff will not be able to assist you with questions regarding bills from these companies.  You will be contacted with the lab results as soon as they are available. The fastest way to get your results is to activate your My Chart account. Instructions are located on the last page of this paperwork. If you have not heard from [place] regarding the results in 2 weeks, please contact this office.

## 2018-07-14 NOTE — Progress Notes (Signed)
Subjective:    Patient ID: Samuel Macias, male    DOB: [birth year], 63 y.o.   MRN: [id number]  HPI Samuel Macias is a 63 y.o. male Presents today for: Chief Complaint  Patient presents with  . Recurrent Skin Infections    Patient is here for a f/u on abscess. Last seen here 07/02/18 abscess right thigh. PAtient stated he is doing better and the warm compression and abx helped alot   Right thigh abscess: Initial eval June 3.  Is open and draining at that time although still some induration.  Deferred incision and drainage, doxycycline started with warm compresses.  Improving at follow-up on June 5. Wound culture with staph aureus, penicillin resistant but otherwise pan-sensitive.   Last abx 4 days ago.  Doing much better almost gone.  Warm compresses in afternoon or evening at this point Small scab in center, last pus expressed about 4-5 days ago.        Patient Active Problem List   Diagnosis Date Noted  . Symptomatic bradycardia 04/25/2016  . Dizziness 03/17/2016  . Cough 04/26/2015  . GERD (gastroesophageal reflux disease) 04/26/2015  . Laryngopharyngeal reflux (LPR) 04/26/2015  . Hypertension 03/01/2011  . Hyperlipidemia 03/01/2011  . Personal history of colonic polyps - adenomas 11/05/2006   Past Medical History:  Diagnosis Date  . Hyperlipidemia   . Hypertension   . Personal history of colonic polyps - adenomas 11/05/2006   Past Surgical History:  Procedure Laterality Date  . EXTERNAL EAR SURGERY     as child  . pain block     Herniated disc repair  . WRIST SURGERY     tendon   No Known Allergies Prior to Admission medications   Medication Sig Start Date End Date Taking? Authorizing Provider  amLODipine (NORVASC) 10 MG tablet Take 1 tablet (10 mg total) by mouth daily. 02/12/18  Yes Wendie Agreste, MD  amLODipine (NORVASC) 2.5 MG tablet TAKE 1 TABLET BY MOUTH  DAILY 06/21/18  Yes Wendie Agreste, MD  aspirin 81 MG tablet Take 81 mg by mouth  daily.   Yes [provider]  losartan (COZAAR) 100 MG tablet Take 1 tablet (100 mg total) by mouth daily. 06/24/18  Yes Wendie Agreste, MD  metoprolol succinate (TOPROL-XL) 50 MG 24 hr tablet Take 1 tablet (50 mg total) by mouth daily. Take with or immediately following a meal. 06/24/18  Yes Wendie Agreste, MD  Omega-3 Fatty Acids (FISH OIL) 1200 MG CAPS Take 1 capsule by mouth daily.   Yes [provider]  rosuvastatin (CRESTOR) 20 MG tablet Take 1 tablet (20 mg total) by mouth daily. 06/24/18  Yes Wendie Agreste, MD  sildenafil (REVATIO) 20 MG tablet Take 1-3 tablets (20-60 mg total) by mouth daily as needed. 02/16/18  Yes Wendie Agreste, MD   Social History   Socioeconomic History  . Marital status: Married    Spouse name: [place]  . Number of children: 2  . Years of education: 12th grade  . Highest education level: Not on file  Occupational History  . Occupation: Education officer, community for [employer]  . Financial resource strain: Not on file  . Food insecurity    Worry: Not on file    Inability: Not on file  . Transportation needs    Medical: Not on file    Non-medical: Not on file  Tobacco Use  . Smoking status: Former Smoker    Packs/day: 0.40  Years: 5.00    Pack years: 2.00    Types: Cigarettes    Quit date: 01/27/1981    Years since quitting: 37.4  . Smokeless tobacco: Former Systems developer    Types: [place] date: 01/27/1981  Substance and Sexual Activity  . Alcohol use: Yes    Alcohol/week: 12.0 standard drinks    Types: 12 Standard drinks or equivalent per week    Comment: 12 pack weekly  . Drug use: No  . Sexual activity: Yes  Lifestyle  . Physical activity    Days per week: Not on file    Minutes per session: Not on file  . Stress: Not on file  Relationships  . Social Herbalist on phone: Not on file    Gets together: Not on file    Attends religious service: Not on file    Active member of club or organization:  Not on file    Attends meetings of clubs or organizations: Not on file    Relationship status: Not on file  . Intimate partner violence    Fear of current or ex partner: Not on file    Emotionally abused: Not on file    Physically abused: Not on file    Forced sexual activity: Not on file  Other Topics Concern  . Not on file  Social History Narrative   Lives with his wife and son, [name]. His daughter lives independently in [place], [place].   Education: [employer]   Exercise: Yes    Review of Systems Per HPI.     Objective:   Physical Exam Vitals signs reviewed.  Constitutional:      General: He is not in acute distress.    Appearance: He is well-developed.  HENT:     Head: Normocephalic and atraumatic.  Cardiovascular:     Rate and Rhythm: Normal rate.  Pulmonary:     Effort: Pulmonary effort is normal.  Skin:      Neurological:     Mental Status: He is alert and oriented to person, place, and time.    Vitals:   07/14/18 1544  BP: 140/65  Pulse: 82  Resp: 14  Temp: 98.9 F (37.2 C)  TempSrc: Oral  SpO2: 96%  Weight: 167 lb 9.6 oz (76 kg)         Assessment & Plan:  Samuel Macias is a 62 y.o. male Abscess of right thigh Significant improvement, no significant induration at present, and firm area likely due to scar tissue.  Appears to be healing well  - can stop warm compresses at this time, avoid manipulation of area as much as possible but watch for increased swelling or worsening symptoms.  RTC precautions given.   No orders of the defined types were placed in this encounter.  Patient Instructions    Slight firmness may be due to scar tissue.  Area looks great today.  You can hold off on warm compresses at this point but if any increased size of the firm area or worsening symptoms, return right away.  Thanks for coming in today.    If you have lab work done today you will be contacted with your lab results within the next 2 weeks.  If you have  not heard from [place] then please contact us. The fastest way to get your results is to register for My Chart.   IF you received an x-ray today, you will receive an invoice from [facility].  Please contact [facility]iology at [phone] with questions or concerns regarding your invoice.   IF you received labwork today, you will receive an invoice from [place]. Please contact [facility] at [phone] with questions or concerns regarding your invoice.   Our billing staff will not be able to assist you with questions regarding bills from these companies.  You will be contacted with the lab results as soon as they are available. The fastest way to get your results is to activate your My Chart account. Instructions are located on the last page of this paperwork. If you have not heard from [place] regarding the results in 2 weeks, please contact this office.       Signed,   [name], MD [facility] [place].  07/14/18 5:56 PM

## 2018-08-16 ENCOUNTER — Telehealth: Payer: Self-pay | Admitting: Family Medicine

## 2018-08-16 ENCOUNTER — Encounter: Payer: 59 | Admitting: Family Medicine

## 2018-08-16 NOTE — Telephone Encounter (Signed)
LVM to reschedule appt for today 08/16/2018. Received over the fax on 08/14/2018. Went ahead and cancelled the appt

## 2018-08-16 NOTE — Telephone Encounter (Signed)
done

## 2018-08-17 ENCOUNTER — Encounter: Payer: Self-pay | Admitting: Family Medicine

## 2018-08-17 ENCOUNTER — Other Ambulatory Visit: Payer: Self-pay

## 2018-08-17 ENCOUNTER — Ambulatory Visit (INDEPENDENT_AMBULATORY_CARE_PROVIDER_SITE_OTHER): Payer: 59 | Admitting: Family Medicine

## 2018-08-17 VITALS — BP 152/88 | HR 61 | Temp 98.4°F | Ht 66.0 in | Wt 165.6 lb

## 2018-08-17 DIAGNOSIS — R7303 Prediabetes: Secondary | ICD-10-CM

## 2018-08-17 DIAGNOSIS — E785 Hyperlipidemia, unspecified: Secondary | ICD-10-CM

## 2018-08-17 DIAGNOSIS — I10 Essential (primary) hypertension: Secondary | ICD-10-CM | POA: Diagnosis not present

## 2018-08-17 NOTE — Patient Instructions (Signed)
labwork today-will call with results Restart amlodipine today Monitor blood pressure Drink plenty of water with outdoor activity

## 2018-08-17 NOTE — Progress Notes (Signed)
Established Patient Office Visit  Subjective:  Patient ID: Samuel Macias, male    DOB: 13-May-1955  Age: 63 y.o. MRN: [id number]  CC:  Chief Complaint  Patient presents with  . Review of medications for HTN, Hyperlipidemia, elevated glucose    HPI [facility] presents for HTN medication -amlodipine has recently been stopped by pt as "someone" told him taking the medication could cause  Swelling in the legs and he has noticed swelling described as a ring when socks are removed. No redness or pain. Pt states he decided to stay off the medication to see if blood pressure and losartan only. No CP. No visual changes. No headaches Hyperlipidemia-taking crestor ad omega 3-needs labwork for stablity, no leg pain, no prior concerns E;evated glucose in the past-no h/o DM Past Medical History:  Diagnosis Date  . Hyperlipidemia   . Hypertension   . Personal history of colonic polyps - adenomas 11/05/2006    Past Surgical History:  Procedure Laterality Date  . EXTERNAL EAR SURGERY     as child  . pain block     Herniated disc repair  . WRIST SURGERY     tendon    Family History  Problem Relation Age of Onset  . Crohn's disease Brother   . COPD Mother   . Colon cancer Neg Hx     Social History   Socioeconomic History  . Marital status: Married    Spouse name: [place]  . Number of children: 2  . Years of education: 12th grade  . Highest education level: Not on file  Occupational History  . Occupation: Education officer, community for [employer]  . Financial resource strain: Not on file  . Food insecurity    Worry: Not on file    Inability: Not on file  . Transportation needs    Medical: Not on file    Non-medical: Not on file  Tobacco Use  . Smoking status: Former Smoker    Packs/day: 0.40    Years: 5.00    Pack years: 2.00    Types: Cigarettes    Quit date: 01/27/1981    Years since quitting: 37.5  . Smokeless tobacco: Former Systems developer    Types: [place]  date: 01/27/1981  Substance and Sexual Activity  . Alcohol use: Yes    Alcohol/week: 12.0 standard drinks    Types: 12 Standard drinks or equivalent per week    Comment: 12 pack weekly  . Drug use: No  . Sexual activity: Yes  Lifestyle  . Physical activity    Days per week: Not on file    Minutes per session: Not on file  . Stress: Not on file  Relationships  . Social Herbalist on phone: Not on file    Gets together: Not on file    Attends religious service: Not on file    Active member of club or organization: Not on file    Attends meetings of clubs or organizations: Not on file    Relationship status: Not on file  . Intimate partner violence    Fear of current or ex partner: Not on file    Emotionally abused: Not on file    Physically abused: Not on file    Forced sexual activity: Not on file  Other Topics Concern  . Not on file  Social History Narrative   Lives with his wife and son, [name]. His daughter lives independently in  [place], [place].   Education: High School   Exercise: Yes    Outpatient Medications Prior to Visit  Medication Sig Dispense Refill  . amLODipine (NORVASC) 10 MG tablet Take 1 tablet (10 mg total) by mouth daily. 90 tablet 2  . amLODipine (NORVASC) 2.5 MG tablet TAKE 1 TABLET BY MOUTH  DAILY 90 tablet 0  . aspirin 81 MG tablet Take 81 mg by mouth daily.    Marland Kitchen losartan (COZAAR) 100 MG tablet Take 1 tablet (100 mg total) by mouth daily. 90 tablet 2  . metoprolol succinate (TOPROL-XL) 50 MG 24 hr tablet Take 1 tablet (50 mg total) by mouth daily. Take with or immediately following a meal. 90 tablet 2  . Omega-3 Fatty Acids (FISH OIL) 1200 MG CAPS Take 1 capsule by mouth daily.    . rosuvastatin (CRESTOR) 20 MG tablet Take 1 tablet (20 mg total) by mouth daily. 90 tablet 3  . sildenafil (REVATIO) 20 MG tablet Take 1-3 tablets (20-60 mg total) by mouth daily as needed. 20 tablet 3   No facility-administered medications prior to visit.     ROS  Review of Systems  Constitutional: Negative for fatigue and fever.  HENT: Positive for hearing loss.   Eyes: Negative for visual disturbance.  Respiratory: Negative for cough and shortness of breath.   Cardiovascular: Negative for chest pain.  Gastrointestinal: Negative for blood in stool, constipation and diarrhea.  Genitourinary: Negative for difficulty urinating.  Musculoskeletal: Negative for back pain and joint swelling.  Skin: Negative for wound.  Neurological: Negative for dizziness and headaches.  Psychiatric/Behavioral: Negative for behavioral problems. The patient is not nervous/anxious.   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 He appears well-developed and well-nourished. No distress.  HENT:  Head: Normocephalic and atraumatic.  Eyes: Conjunctivae are normal.  Neck: Normal range of motion. Neck supple.  Cardiovascular: Normal rate, regular rhythm, normal heart sounds and intact distal pulses.  Pulmonary/Chest: Effort normal and breath sounds normal.  Abdominal: Soft. Bowel sounds are normal.  Musculoskeletal:        General: No edema.  Neurological: He is alert and oriented to person, place, and time.  Psychiatric: He has a normal mood and affect.  ventral hernia, umbilical hernia  BP (!) 152/88 (BP Location: Left Arm, Patient Position: Sitting, Cuff Size: Normal)   Pulse 61   Temp 98.4 F (36.9 C) (Oral)   Ht '5\' 6"'  (1.676 m)   Wt 165 lb 9.6 oz (75.1 kg)   SpO2 96%   BMI 26.73 kg/m  Wt Readings from Last 3 Encounters:  08/17/18 165 lb 9.6 oz (75.1 kg)  07/14/18 167 lb 9.6 oz (76 kg)  07/02/18 170 lb (77.1 kg)     Lab Results  Component Value Date   TSH 2.220 06/18/2017   Lab Results  Component  Value Date   WBC 8.8 05/13/2017   HGB 15.2 05/13/2017   HCT 43.1 05/13/2017   MCV 89.6 05/13/2017   PLT 239 05/13/2017   Lab Results  Component Value Date   NA 138 02/12/2018   K 4.1 02/12/2018   CO2 23 02/12/2018   GLUCOSE 97 02/12/2018   BUN 9 02/12/2018   CREATININE 0.82 02/12/2018   BILITOT 0.4 02/12/2018   ALKPHOS 51 02/12/2018   AST 21 02/12/2018  ALT 35 02/12/2018   PROT 6.8 02/12/2018   ALBUMIN 4.4 02/12/2018   CALCIUM 9.4 02/12/2018   ANIONGAP 15 05/13/2017   Lab Results  Component Value Date   CHOL 159 02/12/2018   Lab Results  Component Value Date   HDL 45 02/12/2018   Lab Results  Component Value Date   LDLCALC 92 02/12/2018   Lab Results  Component Value Date   TRIG 109 02/12/2018   Lab Results  Component Value Date   CHOLHDL 3.5 02/12/2018   Lab Results  Component Value Date   HGBA1C 6.3 (H) 02/12/2018      Assessment & Plan:  1. Essential hypertension Elevated bp off norvas-d/w pt risk/benefit/side effects-pt understands importance of taking all meds daily-will resume amlodipine and take blood pressure readings at home-urine today, renal function today - TSH - Lipid Panel - CMP14+EGFR - Hemoglobin A1c - Urine Microscopic  2. Hyperlipidemia, unspecified hyperlipidemia type Lipid panel today with liver function for surveillance-stable in the past, no current side effect concerns  - TSH - Lipid Panel - CMP14+EGFR - Hemoglobin A1c - Urine Microscopic  3. Prediabetes A1c today, diet discussed-no soda, watch carbs - TSH - Lipid Panel - CMP14+EGFR - Hemoglobin A1c - Urine Microscopic  LISA Hannah Beat, MD

## 2018-08-18 LAB — CMP14+EGFR
ALT: 29 IU/L (ref 0–44)
AST: 26 IU/L (ref 0–40)
Albumin/Globulin Ratio: 1.8 (ref 1.2–2.2)
Albumin: 4.6 g/dL (ref 3.8–4.8)
Alkaline Phosphatase: 45 IU/L (ref 39–117)
BUN/Creatinine Ratio: 12 (ref 10–24)
BUN: 10 mg/dL (ref 8–27)
Bilirubin Total: 0.9 mg/dL (ref 0.0–1.2)
CO2: 22 mmol/L (ref 20–29)
Calcium: 9.4 mg/dL (ref 8.6–10.2)
Chloride: 97 mmol/L (ref 96–106)
Creatinine, Ser: 0.81 mg/dL (ref 0.76–1.27)
GFR calc Af Amer: 109 mL/min/{1.73_m2} (ref >59)
GFR calc non Af Amer: 95 mL/min/{1.73_m2} (ref >59)
Globulin, Total: 2.5 g/dL (ref 1.5–4.5)
Glucose: 108 mg/dL — ABNORMAL HIGH (ref 65–99)
Potassium: 4.6 mmol/L (ref 3.5–5.2)
Sodium: 134 mmol/L (ref 134–144)
Total Protein: 7.1 g/dL (ref 6.0–8.5)

## 2018-08-18 LAB — URINALYSIS, MICROSCOPIC ONLY
Casts: NONE SEEN /lpf
Epithelial Cells (non renal): NONE SEEN /hpf (ref 0–10)
WBC, UA: NONE SEEN /hpf (ref 0–5)

## 2018-08-18 LAB — LIPID PANEL
Chol/HDL Ratio: 2.8 ratio (ref 0.0–5.0)
Cholesterol, Total: 169 mg/dL (ref 100–199)
HDL: 60 mg/dL (ref >39)
LDL Calculated: 99 mg/dL (ref 0–99)
Triglycerides: 50 mg/dL (ref 0–149)
VLDL Cholesterol Cal: 10 mg/dL (ref 5–40)

## 2018-08-18 LAB — HEMOGLOBIN A1C
Est. average glucose Bld gHb Est-mCnc: 120 mg/dL
Hgb A1c MFr Bld: 5.8 % — ABNORMAL HIGH (ref 4.8–5.6)

## 2018-08-18 LAB — TSH: TSH: 1.75 u[IU]/mL (ref 0.450–4.500)

## 2018-08-19 NOTE — Telephone Encounter (Signed)
Pt called back for results. Verbalizes understanding.

## 2018-08-27 ENCOUNTER — Telehealth (INDEPENDENT_AMBULATORY_CARE_PROVIDER_SITE_OTHER): Payer: 59 | Admitting: Family Medicine

## 2018-08-27 ENCOUNTER — Telehealth: Payer: Self-pay | Admitting: *Deleted

## 2018-08-27 ENCOUNTER — Encounter: Payer: Self-pay | Admitting: Family Medicine

## 2018-08-27 VITALS — BP 140/76 | Ht 66.0 in | Wt 158.0 lb

## 2018-08-27 DIAGNOSIS — Z20828 Contact with and (suspected) exposure to other viral communicable diseases: Secondary | ICD-10-CM | POA: Diagnosis not present

## 2018-08-27 DIAGNOSIS — Z20822 Contact with and (suspected) exposure to covid-19: Secondary | ICD-10-CM

## 2018-08-27 NOTE — Telephone Encounter (Signed)
Called patient to triage for appointment. Unable to leave message in mobile voice mail, mailbox is full. I will call back later.

## 2018-08-27 NOTE — Telephone Encounter (Signed)
Called patient for second time to triage for appointment. Unable to leave a message in mobile voice mail, mail box is full.

## 2018-08-27 NOTE — Patient Instructions (Signed)
I do recommend isolation as much as possible until your wife's test results have returned, but I have ordered the Covid 19 test at the [facility] - you can have that done on Monday.

## 2018-08-27 NOTE — Progress Notes (Signed)
Virtual Visit via Telephone Note 5:42 PM No answer on mobile, or home phone.  I connected with Samuel Macias on 08/27/18 at 5:52 PM  by telephone and verified that I am speaking with the correct person using two identifiers.   I discussed the limitations, risks, security and privacy concerns of performing an evaluation and management service by telephone and the availability of in person appointments. I also discussed with the patient that there may be a patient responsible charge related to this service. The patient expressed understanding and agreed to proceed, consent obtained  Chief complaint: Testing for COVID  History of Present Illness: Samuel Macias is a 63 y.o. male  Possible exposure to [place], son-in-law tested +2 weeks ago and is now quarantined.  He was around his son in law when could have been contagious, and not wearing mask. Wife has been having symptoms of headache, sinus problems, no fever and she went for COVID testing yesterday, still waiting for results.  Has grandbaby.   He has no symptoms, just want to be tested.  No fever/cough/myalgias/change in taste/smell.  Wearing mask at work.     Patient Active Problem List   Diagnosis Date Noted  . Prediabetes 08/17/2018  . Symptomatic bradycardia 04/25/2016  . Dizziness 03/17/2016  . Cough 04/26/2015  . GERD (gastroesophageal reflux disease) 04/26/2015  . Laryngopharyngeal reflux (LPR) 04/26/2015  . Hypertension 03/01/2011  . Hyperlipidemia 03/01/2011  . Personal history of colonic polyps - adenomas 11/05/2006   Past Medical History:  Diagnosis Date  . Hyperlipidemia   . Hypertension   . Personal history of colonic polyps - adenomas 11/05/2006   Past Surgical History:  Procedure Laterality Date  . EXTERNAL EAR SURGERY     as child  . pain block     Herniated disc repair  . WRIST SURGERY     tendon   No Known Allergies Prior to Admission medications   Medication Sig Start Date End Date  Taking? Authorizing Provider  amLODipine (NORVASC) 10 MG tablet Take 1 tablet (10 mg total) by mouth daily. 02/12/18  Yes Wendie Agreste, MD  aspirin 81 MG tablet Take 81 mg by mouth daily.   Yes [provider]  losartan (COZAAR) 100 MG tablet Take 1 tablet (100 mg total) by mouth daily. 06/24/18  Yes Wendie Agreste, MD  metoprolol succinate (TOPROL-XL) 50 MG 24 hr tablet Take 1 tablet (50 mg total) by mouth daily. Take with or immediately following a meal. 06/24/18  Yes Wendie Agreste, MD  Omega-3 Fatty Acids (FISH OIL) 1200 MG CAPS Take 1 capsule by mouth daily.   Yes [provider]  rosuvastatin (CRESTOR) 20 MG tablet Take 1 tablet (20 mg total) by mouth daily. 06/24/18  Yes Wendie Agreste, MD  sildenafil (REVATIO) 20 MG tablet Take 1-3 tablets (20-60 mg total) by mouth daily as needed. 02/16/18  Yes Wendie Agreste, MD  amLODipine (NORVASC) 2.5 MG tablet TAKE 1 TABLET BY MOUTH  DAILY Patient not taking: Reported on 08/27/2018 06/21/18   Wendie Agreste, MD   Social History   Socioeconomic History  . Marital status: Married    Spouse name: [place]  . Number of children: 2  . Years of education: 12th grade  . Highest education level: Not on file  Occupational History  . Occupation: Education officer, community for [employer]  . Financial resource strain: Not on file  . Food insecurity    Worry: Not on  file    Inability: Not on file  . Transportation needs    Medical: Not on file    Non-medical: Not on file  Tobacco Use  . Smoking status: Former Smoker    Packs/day: 0.40    Years: 5.00    Pack years: 2.00    Types: Cigarettes    Quit date: 01/27/1981    Years since quitting: 37.6  . Smokeless tobacco: Former Systems developer    Types: [place] date: 01/27/1981  Substance and Sexual Activity  . Alcohol use: Yes    Alcohol/week: 12.0 standard drinks    Types: 12 Standard drinks or equivalent per week    Comment: 12 pack weekly  . Drug use: No  . Sexual  activity: Yes  Lifestyle  . Physical activity    Days per week: Not on file    Minutes per session: Not on file  . Stress: Not on file  Relationships  . Social Herbalist on phone: Not on file    Gets together: Not on file    Attends religious service: Not on file    Active member of club or organization: Not on file    Attends meetings of clubs or organizations: Not on file    Relationship status: Not on file  . Intimate partner violence    Fear of current or ex partner: Not on file    Emotionally abused: Not on file    Physically abused: Not on file    Forced sexual activity: Not on file  Other Topics Concern  . Not on file  Social History Narrative   Lives with his wife and son, [name]. His daughter lives independently in [place], [place].   Education: High School   Exercise: Yes     Observations/Objective: Vitals:   08/27/18 1624  BP: 140/76  Weight: 158 lb (71.7 kg)  Height: 5\' 6"  (1.676 m)   No distress on phone.  All questions answered, understand expressed.  Normal respiratory effort.  Assessment and Plan: Exposure to Covid-19 Virus - Plan: Novel Coronavirus, N[facility] ([facility]),   -Asymptomatic.  Possible exposure to son-in-law who had been in the same house, but not significant close contact known.  However that was without mask.  Wife is having some symptoms and testing is pending.  -COVID-19 testing ordered for drive up test.  Recommended self isolation is much as possible for now until his spouse's results are known, and ideally until his results are known.  If symptomatic, recommend repeat telemedicine visit to discuss treatment/evaluation further.   Follow Up Instructions: As needed.   Patient Instructions  I do recommend isolation as much as possible until your wife's test results have returned, but I have ordered the Covid 19 test at the [facility] - you can have that done on Monday.         I discussed the assessment and treatment plan  with the patient. The patient was provided an opportunity to ask questions and all were answered. The patient agreed with the plan and demonstrated an understanding of the instructions.   The patient was advised to call back or seek an in-person evaluation if the symptoms worsen or if the condition fails to improve as anticipated.  I provided 14 minutes of non-face-to-face time during this encounter.  Signed,   [name], MD [facility].  08/27/18

## 2018-08-27 NOTE — Progress Notes (Signed)
Called patient to triage for appointment. Patient states he wants to be tested for [place], he does not have any symptoms. Patient states his wife went for COVID19 testing today and waiting for the results. Patient states wife is having symptoms of headache, sinus problems, no fever and she felt like she had COVID.  Also, patient states his son n law was tested positive for COVID 2 weeks ago and is quarantined.

## 2018-08-30 ENCOUNTER — Other Ambulatory Visit: Payer: Self-pay

## 2018-08-30 DIAGNOSIS — Z20822 Contact with and (suspected) exposure to covid-19: Secondary | ICD-10-CM

## 2018-08-31 LAB — NOVEL CORONAVIRUS, NAA: SARS-CoV-2, NAA: NOT DETECTED

## 2018-09-13 ENCOUNTER — Telehealth: Payer: Self-pay | Admitting: Family Medicine

## 2018-09-13 DIAGNOSIS — I10 Essential (primary) hypertension: Secondary | ICD-10-CM

## 2018-09-13 NOTE — Telephone Encounter (Signed)
Pt believes he used to be on 10mg  of amLODipine (NORVASC) But his new script is for 2.5mg .  Pt wants to make sure that is in fact correct.

## 2018-09-13 NOTE — Telephone Encounter (Signed)
Pt was given 2.5mg  amlodipine, he says he is taking 10mg . I DID NOT see mention of change. Pls let me know what I should tell pt about this change

## 2018-09-14 ENCOUNTER — Other Ambulatory Visit: Payer: Self-pay | Admitting: Family Medicine

## 2018-09-14 MED ORDER — AMLODIPINE BESYLATE 10 MG PO TABS: 10.0000 mg | ORAL_TABLET | Freq: Every day | ORAL | 2 refills | Status: DC

## 2018-09-14 NOTE — Telephone Encounter (Signed)
Appointment with Dr. Holly Bodily on July 21.  Plan to resume amlodipine at that time.  10 mg dose noted on January visit.  Refilled at 10 mg dose.

## 2018-09-27 ENCOUNTER — Other Ambulatory Visit: Payer: Self-pay | Admitting: Family Medicine

## 2018-09-27 DIAGNOSIS — E785 Hyperlipidemia, unspecified: Secondary | ICD-10-CM

## 2018-09-27 MED ORDER — ROSUVASTATIN CALCIUM 20 MG PO TABS: 20.0000 mg | ORAL_TABLET | Freq: Every day | ORAL | 3 refills | Status: DC

## 2018-09-27 NOTE — Telephone Encounter (Signed)
Relation to pt: self  Call back [place]:  [place], [place] [phone] (Phone) [phone] (Fax)    Reason for call:  Patient requested a 90 day supply rosuvastatin (CRESTOR) 20 MG tablet [facility].

## 2018-09-27 NOTE — Telephone Encounter (Signed)
Medication:  rosuvastatin (CRESTOR) 20 MG tablet     Patient is requesting a refill of this medication. 90 day supply. Patient is out of this medication.   [place], [place] [employer] [phone] (Phone) [phone] (Fax)

## 2018-09-29 NOTE — Telephone Encounter (Signed)
Rx has been sent by provider

## 2018-09-30 ENCOUNTER — Other Ambulatory Visit: Payer: Self-pay | Admitting: Family Medicine

## 2018-10-07 MED ORDER — ROSUVASTATIN CALCIUM 20 MG PO TABS: 20.0000 mg | ORAL_TABLET | Freq: Every day | ORAL | 3 refills | Status: DC

## 2018-10-07 NOTE — Telephone Encounter (Signed)
Pt called back in to follow up on Rx for Crestor. Pt says that Rx should have been sent to mail order. Pt says that [facility] has not received.   Please assist and update pt   [phone]

## 2018-10-07 NOTE — Addendum Note (Signed)
Addended by: Jefferson Fuel on: 10/07/2018 08:53 AM   Modules accepted: Orders

## 2018-10-07 NOTE — Telephone Encounter (Signed)
[facility]

## 2018-11-09 ENCOUNTER — Other Ambulatory Visit: Payer: Self-pay

## 2018-11-09 DIAGNOSIS — Z20822 Contact with and (suspected) exposure to covid-19: Secondary | ICD-10-CM

## 2018-11-11 LAB — NOVEL CORONAVIRUS, NAA: SARS-CoV-2, NAA: NOT DETECTED

## 2019-01-06 ENCOUNTER — Telehealth: Payer: Self-pay | Admitting: Family Medicine

## 2019-01-06 ENCOUNTER — Other Ambulatory Visit: Payer: Self-pay

## 2019-01-06 ENCOUNTER — Telehealth (INDEPENDENT_AMBULATORY_CARE_PROVIDER_SITE_OTHER): Payer: 59 | Admitting: Family Medicine

## 2019-01-06 ENCOUNTER — Encounter: Payer: Self-pay | Admitting: Family Medicine

## 2019-01-06 DIAGNOSIS — E785 Hyperlipidemia, unspecified: Secondary | ICD-10-CM | POA: Diagnosis not present

## 2019-01-06 NOTE — Progress Notes (Signed)
Virtual Visit via Telephone Note  I connected with Samuel Macias on 01/06/19 at 6:59 PM by telephone and verified that I am speaking with the correct person using two identifiers.   I discussed the limitations, risks, security and privacy concerns of performing an evaluation and management service by telephone and the availability of in person appointments. I also discussed with the patient that there may be a patient responsible charge related to this service. The patient expressed understanding and agreed to proceed, consent obtained  Chief complaint: Med refill   History of Present Illness: Samuel Macias is a 63 y.o. male  Hyperlipidemia: Crestor 20mg  qd. Running out of med. Has appt with me in January for labs and to review other meds. Has plenty of other meds.  #90 of Crestor with 3 refills sent 10/07/18  - not sure if refills left - plans to call [facility].  email from insurance about lower cost options? Not sure of details. copay of metoprolol and crestor may be going up next year. Plans to bring email to next visit.  No new myalgias/side effects of crestor.   Lab Results  Component Value Date   CHOL 169 08/17/2018   HDL 60 08/17/2018   LDLCALC 99 08/17/2018   TRIG 50 08/17/2018   CHOLHDL 2.8 08/17/2018   Lab Results  Component Value Date   ALT 29 08/17/2018   AST 26 08/17/2018   ALKPHOS 45 08/17/2018   BILITOT 0.9 08/17/2018      Patient Active Problem List   Diagnosis Date Noted  . Prediabetes 08/17/2018  . Symptomatic bradycardia 04/25/2016  . Dizziness 03/17/2016  . Cough 04/26/2015  . GERD (gastroesophageal reflux disease) 04/26/2015  . Laryngopharyngeal reflux (LPR) 04/26/2015  . Hypertension 03/01/2011  . Hyperlipidemia 03/01/2011  . Personal history of colonic polyps - adenomas 11/05/2006   Past Medical History:  Diagnosis Date  . Hyperlipidemia   . Hypertension   . Personal history of colonic polyps - adenomas 11/05/2006   Past Surgical  History:  Procedure Laterality Date  . EXTERNAL EAR SURGERY     as child  . pain block     Herniated disc repair  . WRIST SURGERY     tendon   No Known Allergies Prior to Admission medications   Medication Sig Start Date End Date Taking? Authorizing Provider  amLODipine (NORVASC) 10 MG tablet Take 1 tablet (10 mg total) by mouth daily. 09/14/18  Yes Wendie Agreste, MD  aspirin 81 MG tablet Take 81 mg by mouth daily.   Yes [provider]  losartan (COZAAR) 100 MG tablet Take 1 tablet (100 mg total) by mouth daily. 06/24/18  Yes Wendie Agreste, MD  metoprolol succinate (TOPROL-XL) 50 MG 24 hr tablet Take 1 tablet (50 mg total) by mouth daily. Take with or immediately following a meal. 06/24/18  Yes Wendie Agreste, MD  Omega-3 Fatty Acids (FISH OIL) 1200 MG CAPS Take 1 capsule by mouth daily.   Yes [provider]  rosuvastatin (CRESTOR) 20 MG tablet Take 1 tablet (20 mg total) by mouth daily. 10/07/18  Yes Wendie Agreste, MD  sildenafil (REVATIO) 20 MG tablet Take 1-3 tablets (20-60 mg total) by mouth daily as needed. 02/16/18  Yes Wendie Agreste, MD   Social History   Socioeconomic History  . Marital status: Married    Spouse name: [place]  . Number of children: 2  . Years of education: 12th grade  . Highest education  level: Not on file  Occupational History  . Occupation: Education officer, community for [employer]  . Smoking status: Former Smoker    Packs/day: 0.40    Years: 5.00    Pack years: 2.00    Types: Cigarettes    Quit date: 01/27/1981    Years since quitting: 37.9  . Smokeless tobacco: Former Systems developer    Types: [place] date: 01/27/1981  Substance and Sexual Activity  . Alcohol use: Yes    Alcohol/week: 12.0 standard drinks    Types: 12 Standard drinks or equivalent per week    Comment: 12 pack weekly  . Drug use: No  . Sexual activity: Yes  Other Topics Concern  . Not on file  Social History Narrative   Lives with his wife and  son, [name]. His daughter lives independently in [place], [place].   Education: [employer]   Exercise: Yes   Social Determinants of Health   Financial Resource Strain:   . Difficulty of Paying Living Expenses: Not on file  Food Insecurity:   . Worried About Charity fundraiser in the Last Year: Not on file  . Ran Out of Food in the Last Year: Not on file  Transportation Needs:   . Lack of Transportation (Medical): Not on file  . Lack of Transportation (Non-Medical): Not on file  Physical Activity:   . Days of Exercise per Week: Not on file  . Minutes of Exercise per Session: Not on file  Stress:   . Feeling of Stress : Not on file  Social Connections:   . Frequency of Communication with Friends and Family: Not on file  . Frequency of Social Gatherings with Friends and Family: Not on file  . Attends Religious Services: Not on file  . Active Member of Clubs or Organizations: Not on file  . Attends Archivist Meetings: Not on file  . Marital Status: Not on file  Intimate Partner Violence:   . Fear of Current or Ex-Partner: Not on file  . Emotionally Abused: Not on file  . Physically Abused: Not on file  . Sexually Abused: Not on file     Observations/Objective: Vitals:   01/06/19 1505  Weight: 163 lb (73.9 kg)  Height: 5\' 6"  (1.676 m)   Appropriate responses, no distress on phone, all questions answered  Assessment and Plan: Hyperlipidemia, unspecified hyperlipidemia type - Plan: rosuvastatin (CRESTOR) 20 MG tablet   Tolerating Crestor at current dose.  Possible lower cost option recommended by insurance.  Plans to bring paperwork to next office visit in January when we will do lab work to review.  He does have refills that were sent September 10, advised [facility] to [facility] and if they do not have record of those refills, can send a new prescription.   Follow Up Instructions:  As scheduled in January.   I discussed the assessment and treatment plan  with the patient. The patient was provided an opportunity to ask questions and all were answered. The patient agreed with the plan and demonstrated an understanding of the instructions.   The patient was advised to call back or seek an in-person evaluation if the symptoms worsen or if the condition fails to improve as anticipated.  I provided 11 minutes of non-face-to-face time during this encounter.  Signed,   [name], MD [facility].  01/06/19

## 2019-01-19 ENCOUNTER — Ambulatory Visit: Payer: 59 | Attending: Internal Medicine

## 2019-01-19 DIAGNOSIS — Z20822 Contact with and (suspected) exposure to covid-19: Secondary | ICD-10-CM

## 2019-01-20 LAB — NOVEL CORONAVIRUS, NAA: SARS-CoV-2, NAA: NOT DETECTED

## 2019-01-31 ENCOUNTER — Other Ambulatory Visit: Payer: Self-pay | Admitting: Family Medicine

## 2019-01-31 DIAGNOSIS — I10 Essential (primary) hypertension: Secondary | ICD-10-CM

## 2019-02-16 ENCOUNTER — Ambulatory Visit: Payer: 59 | Admitting: Family Medicine

## 2019-02-21 ENCOUNTER — Other Ambulatory Visit: Payer: Self-pay

## 2019-02-21 ENCOUNTER — Ambulatory Visit: Payer: 59 | Admitting: Family Medicine

## 2019-02-21 ENCOUNTER — Encounter: Payer: Self-pay | Admitting: Family Medicine

## 2019-02-21 VITALS — BP 138/82 | HR 55 | Temp 98.1°F | Ht 66.0 in | Wt 171.2 lb

## 2019-02-21 DIAGNOSIS — E785 Hyperlipidemia, unspecified: Secondary | ICD-10-CM

## 2019-02-21 DIAGNOSIS — R7303 Prediabetes: Secondary | ICD-10-CM | POA: Diagnosis not present

## 2019-02-21 DIAGNOSIS — I10 Essential (primary) hypertension: Secondary | ICD-10-CM | POA: Diagnosis not present

## 2019-02-21 LAB — COMPREHENSIVE METABOLIC PANEL
ALT: 23 IU/L (ref 0–44)
AST: 21 IU/L (ref 0–40)
Albumin/Globulin Ratio: 1.9 (ref 1.2–2.2)
Albumin: 4.3 g/dL (ref 3.8–4.8)
Alkaline Phosphatase: 39 IU/L (ref 39–117)
BUN/Creatinine Ratio: 10 (ref 10–24)
BUN: 8 mg/dL (ref 8–27)
Bilirubin Total: 0.4 mg/dL (ref 0.0–1.2)
CO2: 22 mmol/L (ref 20–29)
Calcium: 9.2 mg/dL (ref 8.6–10.2)
Chloride: 100 mmol/L (ref 96–106)
Creatinine, Ser: 0.78 mg/dL (ref 0.76–1.27)
GFR calc Af Amer: 110 mL/min/{1.73_m2} (ref >59)
GFR calc non Af Amer: 95 mL/min/{1.73_m2} (ref >59)
Globulin, Total: 2.3 g/dL (ref 1.5–4.5)
Glucose: 110 mg/dL — ABNORMAL HIGH (ref 65–99)
Potassium: 4.2 mmol/L (ref 3.5–5.2)
Sodium: 137 mmol/L (ref 134–144)
Total Protein: 6.6 g/dL (ref 6.0–8.5)

## 2019-02-21 LAB — LIPID PANEL
Chol/HDL Ratio: 2.7 ratio (ref 0.0–5.0)
Cholesterol, Total: 141 mg/dL (ref 100–199)
HDL: 52 mg/dL (ref >39)
LDL Chol Calc (NIH): 74 mg/dL (ref 0–99)
Triglycerides: 75 mg/dL (ref 0–149)
VLDL Cholesterol Cal: 15 mg/dL (ref 5–40)

## 2019-02-21 LAB — HEMOGLOBIN A1C
Est. average glucose Bld gHb Est-mCnc: 126 mg/dL
Hgb A1c MFr Bld: 6 % — ABNORMAL HIGH (ref 4.8–5.6)

## 2019-02-21 MED ORDER — ROSUVASTATIN CALCIUM 20 MG PO TABS: 20.0000 mg | ORAL_TABLET | Freq: Every day | ORAL | 3 refills | Status: DC

## 2019-02-21 MED ORDER — METOPROLOL SUCCINATE ER 50 MG PO TB24: ORAL_TABLET | ORAL | 2 refills | Status: DC

## 2019-02-21 MED ORDER — AMLODIPINE BESYLATE 10 MG PO TABS: 10.0000 mg | ORAL_TABLET | Freq: Every day | ORAL | 2 refills | Status: DC

## 2019-02-21 MED ORDER — LOSARTAN POTASSIUM 100 MG PO TABS: 100.0000 mg | ORAL_TABLET | Freq: Every day | ORAL | 2 refills | Status: DC

## 2019-02-21 NOTE — Progress Notes (Signed)
Subjective:  Patient ID: Samuel Macias, male    DOB: [birth year]  Age: 64 y.o. MRN: [id number]  CC:  Chief Complaint  Patient presents with  . Follow-up    on pt't hypertention and hyperlipidemia. pt hasn't had any physical symptoms of these contitions.    HPI [facility] presents for   Hypertension: Amlodipine 10 mg daily, 100 mg losartan daily, Toprol-XL 50 mg daily Home readings: 134/84.   Weight has increased. Walking, less activity outside of the house.  Alcohol: 12 pack per week.  Rare soda, sweet tea.  Fast food: once per week.  No CP/HA or new side effects.  BP Readings from Last 3 Encounters:  02/21/19 138/82  08/27/18 140/76  08/17/18 (!) 152/88   Lab Results  Component Value Date   CREATININE 0.81 08/17/2018    Hyperlipidemia: Crestor 20 mg daily. No new myalgias.   Lab Results  Component Value Date   CHOL 169 08/17/2018   HDL 60 08/17/2018   LDLCALC 99 08/17/2018   TRIG 50 08/17/2018   CHOLHDL 2.8 08/17/2018   Lab Results  Component Value Date   ALT 29 08/17/2018   AST 26 08/17/2018   ALKPHOS 45 08/17/2018   BILITOT 0.9 08/17/2018   Prediabetes  Wt Readings from Last 3 Encounters:  02/21/19 171 lb 3.2 oz (77.7 kg)  01/06/19 163 lb (73.9 kg)  08/27/18 158 lb (71.7 kg)   Lab Results  Component Value Date   HGBA1C 5.8 (H) 08/17/2018       History Patient Active Problem List   Diagnosis Date Noted  . Prediabetes 08/17/2018  . Symptomatic bradycardia 04/25/2016  . Dizziness 03/17/2016  . Cough 04/26/2015  . GERD (gastroesophageal reflux disease) 04/26/2015  . Laryngopharyngeal reflux (LPR) 04/26/2015  . Hypertension 03/01/2011  . Hyperlipidemia 03/01/2011  . Personal history of colonic polyps - adenomas 11/05/2006   Past Medical History:  Diagnosis Date  . Hyperlipidemia   . Hypertension   . Personal history of colonic polyps - adenomas 11/05/2006   Past Surgical History:  Procedure Laterality Date  . EXTERNAL EAR  SURGERY     as child  . pain block     Herniated disc repair  . WRIST SURGERY     tendon   No Known Allergies Prior to Admission medications   Medication Sig Start Date End Date Taking? Authorizing Provider  amLODipine (NORVASC) 10 MG tablet Take 1 tablet (10 mg total) by mouth daily. 09/14/18  Yes Wendie Agreste, MD  aspirin 81 MG tablet Take 81 mg by mouth daily.   Yes [provider]  losartan (COZAAR) 100 MG tablet Take 1 tablet (100 mg total) by mouth daily. 06/24/18  Yes Wendie Agreste, MD  metoprolol succinate (TOPROL-XL) 50 MG 24 hr tablet TAKE 1 TABLET BY MOUTH  DAILY WITH OR IMMEDIATLEY  FOLLOWING A MEAL 02/01/19  Yes Wendie Agreste, MD  Omega-3 Fatty Acids (FISH OIL) 1200 MG CAPS Take 1 capsule by mouth daily.   Yes [provider]  rosuvastatin (CRESTOR) 20 MG tablet Take 1 tablet (20 mg total) by mouth daily. 10/07/18  Yes Wendie Agreste, MD  sildenafil (REVATIO) 20 MG tablet Take 1-3 tablets (20-60 mg total) by mouth daily as needed. 02/16/18  Yes Wendie Agreste, MD   Social History   Socioeconomic History  . Marital status: Married    Spouse name: Samuel Macias  . Number of children: 2  . Years of education: 12th grade  .  Highest education level: Not on file  Occupational History  . Occupation: Education officer, community for [employer]  . Smoking status: Former Smoker    Packs/day: 0.40    Years: 5.00    Pack years: 2.00    Types: Cigarettes    Quit date: 01/27/1981    Years since quitting: 38.0  . Smokeless tobacco: Former Systems developer    Types: [place] date: 01/27/1981  Substance and Sexual Activity  . Alcohol use: Yes    Alcohol/week: 12.0 standard drinks    Types: 12 Standard drinks or equivalent per week    Comment: 12 pack weekly  . Drug use: No  . Sexual activity: Yes  Other Topics Concern  . Not on file  Social History Narrative   Lives with his wife and son, Samuel Macias. His daughter lives independently in [place], [place].   Education:  [employer]   Exercise: Yes   Social Determinants of Health   Financial Resource Strain:   . Difficulty of Paying Living Expenses: Not on file  Food Insecurity:   . Worried About Charity fundraiser in the Last Year: Not on file  . Ran Out of Food in the Last Year: Not on file  Transportation Needs:   . Lack of Transportation (Medical): Not on file  . Lack of Transportation (Non-Medical): Not on file  Physical Activity:   . Days of Exercise per Week: Not on file  . Minutes of Exercise per Session: Not on file  Stress:   . Feeling of Stress : Not on file  Social Connections:   . Frequency of Communication with Friends and Family: Not on file  . Frequency of Social Gatherings with Friends and Family: Not on file  . Attends Religious Services: Not on file  . Active Member of Clubs or Organizations: Not on file  . Attends Archivist Meetings: Not on file  . Marital Status: Not on file  Intimate Partner Violence:   . Fear of Current or Ex-Partner: Not on file  . Emotionally Abused: Not on file  . Physically Abused: Not on file  . Sexually Abused: Not on file    Review of Systems  Constitutional: Negative for fatigue and unexpected weight change.  Eyes: Negative for visual disturbance.  Respiratory: Negative for cough, chest tightness and shortness of breath.   Cardiovascular: Negative for chest pain, palpitations and leg swelling.  Gastrointestinal: Negative for abdominal pain and blood in stool.  Neurological: Negative for dizziness, light-headedness and headaches.     Objective:   Vitals:   02/21/19 0908 02/21/19 0914  BP: (!) 142/78 138/82  Pulse: (!) 55   Temp: 98.1 F (36.7 C)   TempSrc: Temporal   SpO2: 99%   Weight: 171 lb 3.2 oz (77.7 kg)   Height: 5\' 6"  (1.676 m)      Physical Exam Vitals reviewed.  Constitutional:      Appearance: He is well-developed.  HENT:     Head: Normocephalic and atraumatic.  Eyes:     Pupils: Pupils are equal,  round, and reactive to light.  Neck:     Vascular: No carotid bruit or JVD.  Cardiovascular:     Rate and Rhythm: Normal rate and regular rhythm.     Heart sounds: Normal heart sounds. No murmur.  Pulmonary:     Effort: Pulmonary effort is normal.     Breath sounds: Normal breath sounds. No rales.  Skin:    General: Skin  is warm and dry.  Neurological:     Mental Status: He is alert and oriented to person, place, and time.        Assessment & Plan:  WILIAN EBANKS is a 64 y.o. male . Hyperlipidemia, unspecified hyperlipidemia type - Plan: Hemoglobin A1c, Comprehensive metabolic panel, Lipid panel, rosuvastatin (CRESTOR) 20 MG tablet  -  Stable, tolerating current regimen. Medications refilled. Labs pending as above.   Essential hypertension - Plan: Comprehensive metabolic panel, Lipid panel, losartan (COZAAR) 100 MG tablet, amLODipine (NORVASC) 10 MG tablet, metoprolol succinate (TOPROL-XL) 50 MG 24 hr tablet  - borderline. continue same meds., plan on diet changes - less fast food and decreased alcohol. continue home monitoring with RTC precautions.   Prediabetes - Plan: Hemoglobin A1c  - as above - diet changes planned. Check A1c.   Meds ordered this encounter  Medications  . rosuvastatin (CRESTOR) 20 MG tablet    Sig: Take 1 tablet (20 mg total) by mouth daily.    Dispense:  90 tablet    Refill:  3  . losartan (COZAAR) 100 MG tablet    Sig: Take 1 tablet (100 mg total) by mouth daily.    Dispense:  90 tablet    Refill:  2  . amLODipine (NORVASC) 10 MG tablet    Sig: Take 1 tablet (10 mg total) by mouth daily.    Dispense:  90 tablet    Refill:  2  . metoprolol succinate (TOPROL-XL) 50 MG 24 hr tablet    Sig: TAKE 1 TABLET BY MOUTH  DAILY WITH OR IMMEDIATLEY  FOLLOWING A MEAL    Dispense:  90 tablet    Refill:  2    Requesting 1 year supply   Patient Instructions   Try to cut on beer, and fast food to see if that helps with weight. Recheck in 6 months unless  higher home readings or new concerns. No change in meds for now. Thanks for coming in today.      If you have lab work done today you will be contacted with your lab results within the next 2 weeks.  If you have not heard from [place] then please contact us. The fastest way to get your results is to register for My Chart.   IF you received an x-ray today, you will receive an invoice from [facility]. Please contact [facility] at [phone] with questions or concerns regarding your invoice.   IF you received labwork today, you will receive an invoice from [facility]. Please contact [facility] at [phone] with questions or concerns regarding your invoice.   Our billing staff will not be able to assist you with questions regarding bills from these companies.  You will be contacted with the lab results as soon as they are available. The fastest way to get your results is to activate your My Chart account. Instructions are located on the last page of this paperwork. If you have not heard from [place] regarding the results in 2 weeks, please contact this office.         Signed, [name], MD [facility]

## 2019-02-21 NOTE — Patient Instructions (Addendum)
Try to cut on beer, and fast food to see if that helps with weight. Recheck in 6 months unless higher home readings or new concerns. No change in meds for now. Thanks for coming in today.      If you have lab work done today you will be contacted with your lab results within the next 2 weeks.  If you have not heard from [place] then please contact us. The fastest way to get your results is to register for My Chart.   IF you received an x-ray today, you will receive an invoice from [facility]. Please contact [facility] at [phone] with questions or concerns regarding your invoice.   IF you received labwork today, you will receive an invoice from [place]. Please contact [facility] at [phone] with questions or concerns regarding your invoice.   Our billing staff will not be able to assist you with questions regarding bills from these companies.  You will be contacted with the lab results as soon as they are available. The fastest way to get your results is to activate your My Chart account. Instructions are located on the last page of this paperwork. If you have not heard from [place] regarding the results in 2 weeks, please contact this office.

## 2019-05-10 NOTE — Telephone Encounter (Signed)
No additional notes

## 2019-08-24 ENCOUNTER — Ambulatory Visit: Payer: 59 | Admitting: Family Medicine

## 2019-09-01 ENCOUNTER — Other Ambulatory Visit: Payer: Self-pay

## 2019-09-01 ENCOUNTER — Encounter: Payer: Self-pay | Admitting: Family Medicine

## 2019-09-01 ENCOUNTER — Ambulatory Visit: Payer: 59 | Admitting: Family Medicine

## 2019-09-01 VITALS — BP 136/78 | HR 75 | Temp 98.6°F | Resp 15 | Ht 66.0 in | Wt 170.4 lb

## 2019-09-01 DIAGNOSIS — I10 Essential (primary) hypertension: Secondary | ICD-10-CM | POA: Diagnosis not present

## 2019-09-01 DIAGNOSIS — R7303 Prediabetes: Secondary | ICD-10-CM

## 2019-09-01 DIAGNOSIS — E785 Hyperlipidemia, unspecified: Secondary | ICD-10-CM | POA: Diagnosis not present

## 2019-09-01 DIAGNOSIS — F411 Generalized anxiety disorder: Secondary | ICD-10-CM | POA: Diagnosis not present

## 2019-09-01 LAB — COMPREHENSIVE METABOLIC PANEL
ALT: 25 IU/L (ref 0–44)
AST: 21 IU/L (ref 0–40)
Albumin/Globulin Ratio: 1.7 (ref 1.2–2.2)
Albumin: 4.5 g/dL (ref 3.8–4.8)
Alkaline Phosphatase: 52 IU/L (ref 48–121)
BUN/Creatinine Ratio: 17 (ref 10–24)
BUN: 11 mg/dL (ref 8–27)
Bilirubin Total: 0.5 mg/dL (ref 0.0–1.2)
CO2: 23 mmol/L (ref 20–29)
Calcium: 9.7 mg/dL (ref 8.6–10.2)
Chloride: 97 mmol/L (ref 96–106)
Creatinine, Ser: 0.66 mg/dL — ABNORMAL LOW (ref 0.76–1.27)
GFR calc Af Amer: 118 mL/min/{1.73_m2} (ref >59)
GFR calc non Af Amer: 102 mL/min/{1.73_m2} (ref >59)
Globulin, Total: 2.7 g/dL (ref 1.5–4.5)
Glucose: 130 mg/dL — ABNORMAL HIGH (ref 65–99)
Potassium: 4.3 mmol/L (ref 3.5–5.2)
Sodium: 134 mmol/L (ref 134–144)
Total Protein: 7.2 g/dL (ref 6.0–8.5)

## 2019-09-01 LAB — LIPID PANEL
Chol/HDL Ratio: 2.5 ratio (ref 0.0–5.0)
Cholesterol, Total: 169 mg/dL (ref 100–199)
HDL: 67 mg/dL (ref >39)
LDL Chol Calc (NIH): 87 mg/dL (ref 0–99)
Triglycerides: 79 mg/dL (ref 0–149)
VLDL Cholesterol Cal: 15 mg/dL (ref 5–40)

## 2019-09-01 LAB — HEMOGLOBIN A1C
Est. average glucose Bld gHb Est-mCnc: 134 mg/dL
Hgb A1c MFr Bld: 6.3 % — ABNORMAL HIGH (ref 4.8–5.6)

## 2019-09-01 MED ORDER — LOSARTAN POTASSIUM 100 MG PO TABS: 100.0000 mg | ORAL_TABLET | Freq: Every day | ORAL | 2 refills | Status: DC

## 2019-09-01 MED ORDER — ROSUVASTATIN CALCIUM 20 MG PO TABS: 20.0000 mg | ORAL_TABLET | Freq: Every day | ORAL | 3 refills | Status: DC

## 2019-09-01 MED ORDER — PAROXETINE HCL 10 MG PO TABS: 10.0000 mg | ORAL_TABLET | Freq: Every day | ORAL | 1 refills | Status: DC

## 2019-09-01 MED ORDER — HYDROCHLOROTHIAZIDE 12.5 MG PO CAPS: 12.5000 mg | ORAL_CAPSULE | Freq: Every day | ORAL | 1 refills | Status: DC

## 2019-09-01 MED ORDER — METOPROLOL SUCCINATE ER 50 MG PO TB24: ORAL_TABLET | ORAL | 2 refills | Status: DC

## 2019-09-01 MED ORDER — AMLODIPINE BESYLATE 10 MG PO TABS: 10.0000 mg | ORAL_TABLET | Freq: Every day | ORAL | 2 refills | Status: DC

## 2019-09-01 NOTE — Progress Notes (Signed)
Subjective:  Patient ID: Samuel Macias, male    DOB: 1955-02-01  Age: 64 y.o. MRN: [id number]  CC:  Chief Complaint  Patient presents with  . Diabetes    pt has been working on his diet and exercise, pt goes for walks 3 times a week, also works part time at [employer] and reports being very active there as well   . Hyperlipidemia    pt has been working on his diet and exercise, pt has been working on limiting fried foods to help improve numbers  . Hypertension    pt has been taking BP at home has run about 148/87 on average has had as low as 130/76 pt denies physical symptoms   . Anxiety    pt is concerned he is having a spike of anxiety with builiding a new home and several life changes GAD 7 =4 pt took Paxil 10 mg previously to get him through a similar situation     HPI [facility] presents for  Prediabetes: Has been working on diet and exercise. Limiting fried foods. Also working on exercise, walking 3 times a week and active with his work.  Wt Readings from Last 3 Encounters:  09/01/19 170 lb 6.4 oz (77.3 kg)  02/21/19 171 lb 3.2 oz (77.7 kg)  01/06/19 163 lb (73.9 kg)    Lab Results  Component Value Date   HGBA1C 6.0 (H) 02/21/2019   Hyperlipidemia: Crestor 20 mg daily.  No new myalgias.  Lab Results  Component Value Date   CHOL 141 02/21/2019   HDL 52 02/21/2019   LDLCALC 74 02/21/2019   TRIG 75 02/21/2019   CHOLHDL 2.7 02/21/2019   Lab Results  Component Value Date   ALT 23 02/21/2019   AST 21 02/21/2019   ALKPHOS 39 02/21/2019   BILITOT 0.4 02/21/2019   Hypertension: Amlodipine 10 mg daily, losartan 100 mg daily, Toprol-XL 50 mg daily. No new side effects. Does take viagra after starting toprol higher dose in past.  Home readings: 130-140/70-80. Usually near 140's.  BP Readings from Last 3 Encounters:  09/01/19 136/78  02/21/19 138/82  08/27/18 140/76   Lab Results  Component Value Date   CREATININE 0.78 02/21/2019    Anxiety: More  anxious lately, comes and goes. More in past few months.  Has some stress with life.  Took paxil years ago - feels like it worked well, no side effects. Would like to restart.  Prior panic attacks, not recent.  Feels anxious more often.  No counselor.  Tried deep breathing - some helpw.   GAD 7 : Generalized Anxiety Score 09/01/2019  Nervous, Anxious, on Edge 1  Control/stop worrying 1  Worry too much - different things 1  Trouble relaxing 0  Restless 0  Easily annoyed or irritable 1  Afraid - awful might happen 0  Total GAD 7 Score 4     Depression screen [facility] 2/9 09/01/2019 02/21/2019 01/06/2019 08/27/2018 08/17/2018  Decreased Interest 0 0 0 0 0  Down, Depressed, Hopeless 0 0 0 0 0  PHQ - 2 Score 0 0 0 0 0      History Patient Active Problem List   Diagnosis Date Noted  . Prediabetes 08/17/2018  . Symptomatic bradycardia 04/25/2016  . Dizziness 03/17/2016  . Cough 04/26/2015  . GERD (gastroesophageal reflux disease) 04/26/2015  . Laryngopharyngeal reflux (LPR) 04/26/2015  . Hypertension 03/01/2011  . Hyperlipidemia 03/01/2011  . Personal history of colonic polyps - adenomas 11/05/2006  Past Medical History:  Diagnosis Date  . Hyperlipidemia   . Hypertension   . Personal history of colonic polyps - adenomas 11/05/2006   Past Surgical History:  Procedure Laterality Date  . EXTERNAL EAR SURGERY     as child  . pain block     Herniated disc repair  . WRIST SURGERY     tendon   No Known Allergies Prior to Admission medications   Medication Sig Start Date End Date Taking? Authorizing Provider  amLODipine (NORVASC) 10 MG tablet Take 1 tablet (10 mg total) by mouth daily. 02/21/19  Yes Wendie Agreste, MD  aspirin 81 MG tablet Take 81 mg by mouth daily.   Yes [provider]  losartan (COZAAR) 100 MG tablet Take 1 tablet (100 mg total) by mouth daily. 02/21/19  Yes Wendie Agreste, MD  metoprolol succinate (TOPROL-XL) 50 MG 24 hr tablet TAKE 1 TABLET BY  MOUTH  DAILY WITH OR IMMEDIATLEY  FOLLOWING A MEAL 02/21/19  Yes Wendie Agreste, MD  Omega-3 Fatty Acids (FISH OIL) 1200 MG CAPS Take 1 capsule by mouth daily.   Yes [provider]  rosuvastatin (CRESTOR) 20 MG tablet Take 1 tablet (20 mg total) by mouth daily. 02/21/19  Yes Wendie Agreste, MD  sildenafil (REVATIO) 20 MG tablet Take 1-3 tablets (20-60 mg total) by mouth daily as needed. 02/16/18  Yes Wendie Agreste, MD   Social History   Socioeconomic History  . Marital status: Married    Spouse name: Samuel Macias  . Number of children: 2  . Years of education: 12th grade  . Highest education level: Not on file  Occupational History  . Occupation: Education officer, community for [employer]  . Smoking status: Former Smoker    Packs/day: 0.40    Years: 5.00    Pack years: 2.00    Types: Cigarettes    Quit date: 01/27/1981    Years since quitting: 38.6  . Smokeless tobacco: Former Systems developer    Types: [place] date: 01/27/1981  Substance and Sexual Activity  . Alcohol use: Yes    Alcohol/week: 12.0 standard drinks    Types: 12 Standard drinks or equivalent per week    Comment: 12 pack weekly  . Drug use: No  . Sexual activity: Yes  Other Topics Concern  . Not on file  Social History Narrative   Lives with his wife and son, Samuel Macias. His daughter lives independently in [place], [place].   Education: [employer]   Exercise: Yes   Social Determinants of Health   Financial Resource Strain:   . Difficulty of Paying Living Expenses:   Food Insecurity:   . Worried About Charity fundraiser in the Last Year:   . Arboriculturist in the Last Year:   Transportation Needs:   . Film/video editor (Medical):   Marland Kitchen Lack of Transportation (Non-Medical):   Physical Activity:   . Days of Exercise per Week:   . Minutes of Exercise per Session:   Stress:   . Feeling of Stress :   Social Connections:   . Frequency of Communication with Friends and Family:   . Frequency of Social  Gatherings with Friends and Family:   . Attends Religious Services:   . Active Member of Clubs or Organizations:   . Attends Archivist Meetings:   Marland Kitchen Marital Status:   Intimate Partner Violence:   . Fear of Current or Ex-Partner:   .  Emotionally Abused:   Marland Kitchen Physically Abused:   . Sexually Abused:     Review of Systems  Constitutional: Negative for fatigue and unexpected weight change.  Eyes: Negative for visual disturbance.  Respiratory: Negative for cough, chest tightness and shortness of breath.   Cardiovascular: Negative for chest pain, palpitations and leg swelling.  Gastrointestinal: Negative for abdominal pain and blood in stool.  Neurological: Negative for dizziness, light-headedness and headaches.  Psychiatric/Behavioral: The patient is nervous/anxious.      Objective:   Vitals:   09/01/19 0816 09/01/19 0826  BP: (!) 156/86 136/78  Pulse: 75   Resp: 15   Temp: 98.6 F (37 C)   TempSrc: Temporal   SpO2: 97%   Weight: 170 lb 6.4 oz (77.3 kg)   Height: 5\' 6"  (1.676 m)      Physical Exam Vitals reviewed.  Constitutional:      Appearance: He is well-developed.  HENT:     Head: Normocephalic and atraumatic.  Eyes:     Pupils: Pupils are equal, round, and reactive to light.  Neck:     Vascular: No carotid bruit or JVD.  Cardiovascular:     Rate and Rhythm: Normal rate and regular rhythm.     Heart sounds: Normal heart sounds. No murmur heard.   Pulmonary:     Effort: Pulmonary effort is normal.     Breath sounds: Normal breath sounds. No rales.  Skin:    General: Skin is warm and dry.  Neurological:     Mental Status: He is alert and oriented to person, place, and time.        Assessment & Plan:  FRAZIER BALFOUR is a 64 y.o. male . Hyperlipidemia, unspecified hyperlipidemia type - Plan: Lipid panel, Comprehensive metabolic panel, rosuvastatin (CRESTOR) 20 MG tablet  -  Stable, tolerating current regimen. Medications refilled. Labs  pending as above.   Essential hypertension - Plan: Lipid panel, Comprehensive metabolic panel, metoprolol succinate (TOPROL-XL) 50 MG 24 hr tablet, amLODipine (NORVASC) 10 MG tablet, losartan (COZAAR) 100 MG tablet  - decreased control - add HCTZ, potential side effects discussed, orthostaic precautions. Check labs, 6 week follow up.   Prediabetes - Plan: Hemoglobin A1c  - check A1c.   Anxiety state - Plan: PARoxetine (PAXIL) 10 MG tablet  -Restart Paxil.  Handout given on anxiety.  Recheck 6 weeks.  Meds ordered this encounter  Medications  . hydrochlorothiazide (MICROZIDE) 12.5 MG capsule    Sig: Take 1 capsule (12.5 mg total) by mouth daily.    Dispense:  90 capsule    Refill:  1  . metoprolol succinate (TOPROL-XL) 50 MG 24 hr tablet    Sig: TAKE 1 TABLET BY MOUTH  DAILY WITH OR IMMEDIATLEY  FOLLOWING A MEAL    Dispense:  90 tablet    Refill:  2  . amLODipine (NORVASC) 10 MG tablet    Sig: Take 1 tablet (10 mg total) by mouth daily.    Dispense:  90 tablet    Refill:  2  . losartan (COZAAR) 100 MG tablet    Sig: Take 1 tablet (100 mg total) by mouth daily.    Dispense:  90 tablet    Refill:  2  . rosuvastatin (CRESTOR) 20 MG tablet    Sig: Take 1 tablet (20 mg total) by mouth daily.    Dispense:  90 tablet    Refill:  3  . PARoxetine (PAXIL) 10 MG tablet    Sig: Take 1 tablet (10  mg total) by mouth daily.    Dispense:  90 tablet    Refill:  1   Patient Instructions    Add hydrochlorothiazide to other blood pressure meds for better control. If any new side effects, let me know.   Ok to restart paxil for anxiety, but see info below as well.  Recheck in 6 weeks to discuss this further.  Return to the clinic or go to the nearest emergency room if any of your symptoms worsen or new symptoms occur.    Managing Anxiety, Adult After being diagnosed with an anxiety disorder, you may be relieved to know why you have felt or behaved a certain way. You may also feel  overwhelmed about the treatment ahead and what it will mean for your life. With care and support, you can manage this condition and recover from it. How to manage lifestyle changes Managing stress and anxiety  Stress is your body's reaction to life changes and events, both good and bad. Most stress will last just a few hours, but stress can be ongoing and can lead to more than just stress. Although stress can play a major role in anxiety, it is not the same as anxiety. Stress is usually caused by something external, such as a deadline, test, or competition. Stress normally passes after the triggering event has ended.  Anxiety is caused by something internal, such as imagining a terrible outcome or worrying that something will go wrong that will devastate you. Anxiety often does not go away even after the triggering event is over, and it can become long-term (chronic) worry. It is important to understand the differences between stress and anxiety and to manage your stress effectively so that it does not lead to an anxious response. Talk with your health care provider or a counselor to learn more about reducing anxiety and stress. He or she may suggest tension reduction techniques, such as:  Music therapy. This can include creating or listening to music that you enjoy and that inspires you.  Mindfulness-based meditation. This involves being aware of your normal breaths while not trying to control your breathing. It can be done while sitting or walking.  Centering prayer. This involves focusing on a word, phrase, or sacred image that means something to you and brings you peace.  Deep breathing. To do this, expand your stomach and inhale slowly through your nose. Hold your breath for 3-5 seconds. Then exhale slowly, letting your stomach muscles relax.  Self-talk. This involves identifying thought patterns that lead to anxiety reactions and changing those patterns.  Muscle relaxation. This involves  tensing muscles and then relaxing them. Choose a tension reduction technique that suits your lifestyle and personality. These techniques take time and practice. Set aside 5-15 minutes a day to do them. Therapists can offer counseling and training in these techniques. The training to help with anxiety may be covered by some insurance plans. Other things you can do to manage stress and anxiety include:  Keeping a stress/anxiety diary. This can help you learn what triggers your reaction and then learn ways to manage your response.  Thinking about how you react to certain situations. You may not be able to control everything, but you can control your response.  Making time for activities that help you relax and not feeling guilty about spending your time in this way.  Visual imagery and yoga can help you stay calm and relax.  Medicines Medicines can help ease symptoms. Medicines for anxiety include:  Anti-anxiety drugs.  Antidepressants. Medicines are often used as a primary treatment for anxiety disorder. Medicines will be prescribed by a health care provider. When used together, medicines, psychotherapy, and tension reduction techniques may be the most effective treatment. Relationships Relationships can play a big part in helping you recover. Try to spend more time connecting with trusted friends and family members. Consider going to couples counseling, taking family education classes, or going to family therapy. Therapy can help you and others better understand your condition. How to recognize changes in your anxiety Everyone responds differently to treatment for anxiety. Recovery from anxiety happens when symptoms decrease and stop interfering with your daily activities at home or work. This may mean that you will start to:  Have better concentration and focus. Worry will interfere less in your daily thinking.  Sleep better.  Be less irritable.  Have more energy.  Have improved  memory. It is important to recognize when your condition is getting worse. Contact your health care provider if your symptoms interfere with home or work and you feel like your condition is not improving. Follow these instructions at home: Activity  Exercise. Most adults should do the following: ? Exercise for at least 150 minutes each week. The exercise should increase your heart rate and make you sweat (moderate-intensity exercise). ? Strengthening exercises at least twice a week.  Get the right amount and quality of sleep. Most adults need 7-9 hours of sleep each night. Lifestyle   Eat a healthy diet that includes plenty of vegetables, fruits, whole grains, low-fat dairy products, and lean protein. Do not eat a lot of foods that are high in solid fats, added sugars, or salt.  Make choices that simplify your life.  Do not use any products that contain nicotine or tobacco, such as cigarettes, e-cigarettes, and chewing tobacco. If you need help quitting, ask your health care provider.  Avoid caffeine, alcohol, and certain over-the-counter cold medicines. These may make you feel worse. Ask your pharmacist which medicines to avoid. General instructions  Take over-the-counter and prescription medicines only as told by your health care provider.  Keep all follow-up visits as told by your health care provider. This is important. Where to find support You can get help and support from these sources:  Self-help groups.  Online and [employer].  A trusted spiritual leader.  Couples counseling.  Family education classes.  Family therapy. Where to find more information You may find that joining a support group helps you deal with your anxiety. The following sources can help you locate counselors or support groups near you:  [place]: www.mentalhealth[place].net  Anxiety and Depression Association of [place] (ADAA): https://www.clark.net/  National Alliance on Mental  Illness (NAMI): www.nami.org Contact a health care provider if you:  Have a hard time staying focused or finishing daily tasks.  Spend many hours a day feeling worried about everyday life.  Become exhausted by worry.  Start to have headaches, feel tense, or have nausea.  Urinate more than normal.  Have diarrhea. Get help right away if you have:  A racing heart and shortness of breath.  Thoughts of hurting yourself or others. If you ever feel like you may hurt yourself or others, or have thoughts about taking your own life, get help right away. You can go to your nearest emergency department or call:  Your local emergency services (911 in the [place]  A suicide crisis helpline, such as the [place] at (204)047-9245. This is  open 24 hours a day. Summary  Taking steps to learn and use tension reduction techniques can help calm you and help prevent triggering an anxiety reaction.  When used together, medicines, psychotherapy, and tension reduction techniques may be the most effective treatment.  Family, friends, and partners can play a big part in helping you recover from an anxiety disorder. This information is not intended to replace advice given to you by your health care provider. Make sure you discuss any questions you have with your health care provider. Document Revised: 06/15/2018 Document Reviewed: 06/15/2018 Elsevier Patient Education  [employer].   If you have lab work done today you will be contacted with your lab results within the next 2 weeks.  If you have not heard from [place] then please contact us. The fastest way to get your results is to register for My Chart.   IF you received an x-ray today, you will receive an invoice from [facility]. Please contact [facility] at [phone] with questions or concerns regarding your invoice.   IF you received labwork today, you will receive an invoice from [facility]. Please  contact [facility] at [phone] with questions or concerns regarding your invoice.   Our billing staff will not be able to assist you with questions regarding bills from these companies.  You will be contacted with the lab results as soon as they are available. The fastest way to get your results is to activate your My Chart account. Instructions are located on the last page of this paperwork. If you have not heard from [place] regarding the results in 2 weeks, please contact this office.         Signed, [name], MD [facility]

## 2019-09-01 NOTE — Patient Instructions (Addendum)
Add hydrochlorothiazide to other blood pressure meds for better control. If any new side effects, let me know.   Ok to restart paxil for anxiety, but see info below as well.  Recheck in 6 weeks to discuss this further.  Return to the clinic or go to the nearest emergency room if any of your symptoms worsen or new symptoms occur.    Managing Anxiety, Adult After being diagnosed with an anxiety disorder, you may be relieved to know why you have felt or behaved a certain way. You may also feel overwhelmed about the treatment ahead and what it will mean for your life. With care and support, you can manage this condition and recover from it. How to manage lifestyle changes Managing stress and anxiety  Stress is your body's reaction to life changes and events, both good and bad. Most stress will last just a few hours, but stress can be ongoing and can lead to more than just stress. Although stress can play a major role in anxiety, it is not the same as anxiety. Stress is usually caused by something external, such as a deadline, test, or competition. Stress normally passes after the triggering event has ended.  Anxiety is caused by something internal, such as imagining a terrible outcome or worrying that something will go wrong that will devastate you. Anxiety often does not go away even after the triggering event is over, and it can become long-term (chronic) worry. It is important to understand the differences between stress and anxiety and to manage your stress effectively so that it does not lead to an anxious response. Talk with your health care provider or a counselor to learn more about reducing anxiety and stress. He or she may suggest tension reduction techniques, such as:  Music therapy. This can include creating or listening to music that you enjoy and that inspires you.  Mindfulness-based meditation. This involves being aware of your normal breaths while not trying to control your  breathing. It can be done while sitting or walking.  Centering prayer. This involves focusing on a word, phrase, or sacred image that means something to you and brings you peace.  Deep breathing. To do this, expand your stomach and inhale slowly through your nose. Hold your breath for 3-5 seconds. Then exhale slowly, letting your stomach muscles relax.  Self-talk. This involves identifying thought patterns that lead to anxiety reactions and changing those patterns.  Muscle relaxation. This involves tensing muscles and then relaxing them. Choose a tension reduction technique that suits your lifestyle and personality. These techniques take time and practice. Set aside 5-15 minutes a day to do them. Therapists can offer counseling and training in these techniques. The training to help with anxiety may be covered by some insurance plans. Other things you can do to manage stress and anxiety include:  Keeping a stress/anxiety diary. This can help you learn what triggers your reaction and then learn ways to manage your response.  Thinking about how you react to certain situations. You may not be able to control everything, but you can control your response.  Making time for activities that help you relax and not feeling guilty about spending your time in this way.  Visual imagery and yoga can help you stay calm and relax.  Medicines Medicines can help ease symptoms. Medicines for anxiety include:  Anti-anxiety drugs.  Antidepressants. Medicines are often used as a primary treatment for anxiety disorder. Medicines will be prescribed by a health care provider. When used  together, medicines, psychotherapy, and tension reduction techniques may be the most effective treatment. Relationships Relationships can play a big part in helping you recover. Try to spend more time connecting with trusted friends and family members. Consider going to couples counseling, taking family education classes, or going  to family therapy. Therapy can help you and others better understand your condition. How to recognize changes in your anxiety Everyone responds differently to treatment for anxiety. Recovery from anxiety happens when symptoms decrease and stop interfering with your daily activities at home or work. This may mean that you will start to:  Have better concentration and focus. Worry will interfere less in your daily thinking.  Sleep better.  Be less irritable.  Have more energy.  Have improved memory. It is important to recognize when your condition is getting worse. Contact your health care provider if your symptoms interfere with home or work and you feel like your condition is not improving. Follow these instructions at home: Activity  Exercise. Most adults should do the following: ? Exercise for at least 150 minutes each week. The exercise should increase your heart rate and make you sweat (moderate-intensity exercise). ? Strengthening exercises at least twice a week.  Get the right amount and quality of sleep. Most adults need 7-9 hours of sleep each night. Lifestyle   Eat a healthy diet that includes plenty of vegetables, fruits, whole grains, low-fat dairy products, and lean protein. Do not eat a lot of foods that are high in solid fats, added sugars, or salt.  Make choices that simplify your life.  Do not use any products that contain nicotine or tobacco, such as cigarettes, e-cigarettes, and chewing tobacco. If you need help quitting, ask your health care provider.  Avoid caffeine, alcohol, and certain over-the-counter cold medicines. These may make you feel worse. Ask your pharmacist which medicines to avoid. General instructions  Take over-the-counter and prescription medicines only as told by your health care provider.  Keep all follow-up visits as told by your health care provider. This is important. Where to find support You can get help and support from these  sources:  Self-help groups.  Online and [employer].  A trusted spiritual leader.  Couples counseling.  Family education classes.  Family therapy. Where to find more information You may find that joining a support group helps you deal with your anxiety. The following sources can help you locate counselors or support groups near you:  [place]: www.mentalhealthamerica.net  Anxiety and Depression Association of [place] (ADAA): https://www.clark.net/  National Alliance on Mental Illness (NAMI): www.nami.org Contact a health care provider if you:  Have a hard time staying focused or finishing daily tasks.  Spend many hours a day feeling worried about everyday life.  Become exhausted by worry.  Start to have headaches, feel tense, or have nausea.  Urinate more than normal.  Have diarrhea. Get help right away if you have:  A racing heart and shortness of breath.  Thoughts of hurting yourself or others. If you ever feel like you may hurt yourself or others, or have thoughts about taking your own life, get help right away. You can go to your nearest emergency department or call:  Your local emergency services (911 in the [place]  A suicide crisis helpline, such as the [facility] at 670-479-2795. This is open 24 hours a day. Summary  Taking steps to learn and use tension reduction techniques can help calm you and help prevent triggering an anxiety  reaction.  When used together, medicines, psychotherapy, and tension reduction techniques may be the most effective treatment.  Family, friends, and partners can play a big part in helping you recover from an anxiety disorder. This information is not intended to replace advice given to you by your health care provider. Make sure you discuss any questions you have with your health care provider. Document Revised: 06/15/2018 Document Reviewed: 06/15/2018 Elsevier Patient Education  [employer].   If you have lab work done today you will be contacted with your lab results within the next 2 weeks.  If you have not heard from [place] then please contact us. The fastest way to get your results is to register for My Chart.   IF you received an x-ray today, you will receive an invoice from [facility]. Please contact [facility] at [phone] with questions or concerns regarding your invoice.   IF you received labwork today, you will receive an invoice from [facility]. Please contact [facility] at [phone] with questions or concerns regarding your invoice.   Our billing staff will not be able to assist you with questions regarding bills from these companies.  You will be contacted with the lab results as soon as they are available. The fastest way to get your results is to activate your My Chart account. Instructions are located on the last page of this paperwork. If you have not heard from [place] regarding the results in 2 weeks, please contact this office.

## 2019-10-13 ENCOUNTER — Ambulatory Visit: Payer: 59 | Admitting: Family Medicine

## 2019-11-03 ENCOUNTER — Ambulatory Visit: Payer: 59 | Admitting: Family Medicine

## 2019-11-04 ENCOUNTER — Ambulatory Visit: Payer: 59 | Admitting: Registered Nurse

## 2019-11-04 ENCOUNTER — Other Ambulatory Visit: Payer: Self-pay

## 2019-11-04 ENCOUNTER — Encounter: Payer: Self-pay | Admitting: Registered Nurse

## 2019-11-04 VITALS — BP 123/72 | HR 60 | Temp 98.2°F | Resp 18 | Ht 66.0 in | Wt 169.2 lb

## 2019-11-04 DIAGNOSIS — F411 Generalized anxiety disorder: Secondary | ICD-10-CM

## 2019-11-04 DIAGNOSIS — I10 Essential (primary) hypertension: Secondary | ICD-10-CM | POA: Diagnosis not present

## 2019-11-04 NOTE — Patient Instructions (Signed)
° ° ° °  If you have lab work done today you will be contacted with your lab results within the next 2 weeks.  If you have not heard from us then please contact us. The fastest way to get your results is to register for My Chart. ° ° °IF you received an x-ray today, you will receive an invoice from [facility]. Please contact [facility] at [phone] with questions or concerns regarding your invoice.  ° °IF you received labwork today, you will receive an invoice from [facility]. Please contact [facility] at [phone] with questions or concerns regarding your invoice.  ° °Our billing staff will not be able to assist you with questions regarding bills from these companies. ° °You will be contacted with the lab results as soon as they are available. The fastest way to get your results is to activate your My Chart account. Instructions are located on the last page of this paperwork. If you have not heard from us regarding the results in 2 weeks, please contact this office. °  ° ° ° °

## 2019-11-29 ENCOUNTER — Telehealth: Payer: Self-pay | Admitting: Family Medicine

## 2019-11-29 DIAGNOSIS — F411 Generalized anxiety disorder: Secondary | ICD-10-CM

## 2019-11-29 MED ORDER — PAROXETINE HCL 10 MG PO TABS: 10.0000 mg | ORAL_TABLET | Freq: Every day | ORAL | 1 refills | Status: DC

## 2019-11-29 MED ORDER — HYDROCHLOROTHIAZIDE 12.5 MG PO CAPS: 12.5000 mg | ORAL_CAPSULE | Freq: Every day | ORAL | 1 refills | Status: DC

## 2019-11-29 NOTE — Telephone Encounter (Signed)
Medication Refill - Medication: PARoxetine (PAXIL) 10 MG tablet ,hydrochlorothiazide (MICROZIDE) 12.5 MG capsule (Patient is out of medication and needs prescription faxed over to [facility])    Has the patient contacted their pharmacy? yes (Agent: If no, request that the patient contact the pharmacy for the refill.) (Agent: If yes, when and what did the pharmacy advise?)Contact PCP  Preferred Pharmacy (with phone number or street name):  [facility], [address], [address] Phone:  [phone]  Fax:  [phone]     Requested medication (s) are due for refill today: Paroxetine & HCTZ Requested medication (s) are on the active medication list: yes  Last refill: 09/01/19  Future visit scheduled: no; seen in ooffice 11/04/19  Notes to clinic:  Pt would like for refills be faxed to [facility]      Agent: Please be advised that RX refills may take up to 3 business days. We ask that you follow-up with your pharmacy.  Will route to office for final disposition.

## 2019-11-29 NOTE — Telephone Encounter (Signed)
Medication Refill - Medication: PARoxetine (PAXIL) 10 MG tablet ,hydrochlorothiazide (MICROZIDE) 12.5 MG capsule (Patient is out of medication and needs prescription faxed over to [facility])    Has the patient contacted their pharmacy? yes (Agent: If no, request that the patient contact the pharmacy for the refill.) (Agent: If yes, when and what did the pharmacy advise?)Contact PCP  Preferred Pharmacy (with phone number or street name):  [facility], [address], [address] Phone:  [phone]  Fax:  [phone]       Agent: Please be advised that RX refills may take up to 3 business days. We ask that you follow-up with your pharmacy.

## 2019-11-29 NOTE — Addendum Note (Signed)
Addended by: [name] R on: 11/29/2019 03:21 PM   Modules accepted: Orders

## 2019-11-29 NOTE — Addendum Note (Signed)
Addended by: [name] R on: 11/29/2019 03:23 PM   Modules accepted: Orders

## 2019-12-25 ENCOUNTER — Encounter: Payer: Self-pay | Admitting: Registered Nurse

## 2019-12-25 NOTE — Progress Notes (Signed)
Established Patient Office Visit  Subjective:  Patient ID: Samuel Macias, male    DOB: Sep 22, 1955  Age: 64 y.o. MRN: [id number]  CC:  Chief Complaint  Patient presents with  . Follow-up    Patient states he is here for for Hypertension and ANxiety GAD7=1 patient states the medication for anxiety really seems to help    HPI [facility] presents for follow up   Anxiety: started on paxil 10mg  PO qd by Dr. Carlota Raspberry around 1 mo ago. Tolerates well. Anxiety is much less. Can cope very well. No AEs. Wants to continue  Hypertension: Patient Currently taking: hctz 12.5mg  PO qd, losartan 100mg  PO qd, amlodipine 10mg  PO qd, metoprolol 50mg  ER PO qd.  Good effect. No AEs. Denies CV symptoms including: chest pain, shob, doe, headache, visual changes, fatigue, claudication, and dependent edema.   Previous readings and labs: BP Readings from Last 3 Encounters:  11/04/19 123/72  09/01/19 136/78  02/21/19 138/82   Lab Results  Component Value Date   CREATININE 0.66 (L) 09/01/2019      Past Medical History:  Diagnosis Date  . Hyperlipidemia   . Hypertension   . Personal history of colonic polyps - adenomas 11/05/2006    Past Surgical History:  Procedure Laterality Date  . EXTERNAL EAR SURGERY     as child  . pain block     Herniated disc repair  . WRIST SURGERY     tendon    Family History  Problem Relation Age of Onset  . Crohn's disease Brother   . COPD Mother   . Colon cancer Neg Hx     Social History   Socioeconomic History  . Marital status: Married    Spouse name: Jay  . Number of children: 2  . Years of education: 12th grade  . Highest education level: Not on file  Occupational History  . Occupation: Education officer, community for [employer]  . Smoking status: Former Smoker    Packs/day: 0.40    Years: 5.00    Pack years: 2.00    Types: Cigarettes    Quit date: 01/27/1981    Years since quitting: 38.9  . Smokeless tobacco: Former Systems developer     Types: Russell Gabriel date: 01/27/1981  Substance and Sexual Activity  . Alcohol use: Yes    Alcohol/week: 12.0 standard drinks    Types: 12 Standard drinks or equivalent per week    Comment: 12 pack weekly  . Drug use: No  . Sexual activity: Yes  Other Topics Concern  . Not on file  Social History Narrative   Lives with his wife and son, [name]. His daughter lives independently in [place], [place].   Education: [employer]   Exercise: Yes   Social Determinants of Health   Financial Resource Strain:   . Difficulty of Paying Living Expenses: Not on file  Food Insecurity:   . Worried About Charity fundraiser in the Last Year: Not on file  . Ran Out of Food in the Last Year: Not on file  Transportation Needs:   . Lack of Transportation (Medical): Not on file  . Lack of Transportation (Non-Medical): Not on file  Physical Activity:   . Days of Exercise per Week: Not on file  . Minutes of Exercise per Session: Not on file  Stress:   . Feeling of Stress : Not on file  Social Connections:   . Frequency of Communication with Friends and  Family: Not on file  . Frequency of Social Gatherings with Friends and Family: Not on file  . Attends Religious Services: Not on file  . Active Member of Clubs or Organizations: Not on file  . Attends Archivist Meetings: Not on file  . Marital Status: Not on file  Intimate Partner Violence:   . Fear of Current or Ex-Partner: Not on file  . Emotionally Abused: Not on file  . Physically Abused: Not on file  . Sexually Abused: Not on file    Outpatient Medications Prior to Visit  Medication Sig Dispense Refill  . amLODipine (NORVASC) 10 MG tablet Take 1 tablet (10 mg total) by mouth daily. 90 tablet 2  . aspirin 81 MG tablet Take 81 mg by mouth daily.    Marland Kitchen losartan (COZAAR) 100 MG tablet Take 1 tablet (100 mg total) by mouth daily. 90 tablet 2  . metoprolol succinate (TOPROL-XL) 50 MG 24 hr tablet TAKE 1 TABLET BY MOUTH  DAILY WITH OR  IMMEDIATLEY  FOLLOWING A MEAL 90 tablet 2  . Omega-3 Fatty Acids (FISH OIL) 1200 MG CAPS Take 1 capsule by mouth daily.    . rosuvastatin (CRESTOR) 20 MG tablet Take 1 tablet (20 mg total) by mouth daily. 90 tablet 3  . sildenafil (REVATIO) 20 MG tablet Take 1-3 tablets (20-60 mg total) by mouth daily as needed. 20 tablet 3  . hydrochlorothiazide (MICROZIDE) 12.5 MG capsule Take 1 capsule (12.5 mg total) by mouth daily. 90 capsule 1  . PARoxetine (PAXIL) 10 MG tablet Take 1 tablet (10 mg total) by mouth daily. 90 tablet 1   No facility-administered medications prior to visit.    No Known Allergies  ROS Review of Systems  Constitutional: Negative.   HENT: Negative.   Eyes: Negative.   Respiratory: Negative.   Cardiovascular: Negative.   Gastrointestinal: Negative.   Genitourinary: Negative.   Musculoskeletal: Negative.   Skin: Negative.   Neurological: Negative.   Psychiatric/Behavioral: Negative.       Objective:    Physical Exam Constitutional:      General: He is not in acute distress.    Appearance: Normal appearance. He is normal weight. He is not ill-appearing, toxic-appearing or diaphoretic.  Cardiovascular:     Rate and Rhythm: Normal rate and regular rhythm.     Heart sounds: Normal heart sounds. No murmur heard.  No friction rub. No gallop.   Pulmonary:     Effort: Pulmonary effort is normal. No respiratory distress.     Breath sounds: Normal breath sounds. No stridor. No wheezing, rhonchi or rales.  Chest:     Chest wall: No tenderness.  Neurological:     General: No focal deficit present.     Mental Status: He is alert and oriented to person, place, and time. Mental status is at baseline.  Psychiatric:        Mood and Affect: Mood normal.        Behavior: Behavior normal.        Thought Content: Thought content normal.        Judgment: Judgment normal.     BP 123/72   Pulse 60   Temp 98.2 F (36.8 C) (Temporal)   Resp 18   Ht 5\' 6"  (1.676 m)   Wt  169 lb 3.2 oz (76.7 kg)   SpO2 98%   BMI 27.31 kg/m  Wt Readings from Last 3 Encounters:  11/04/19 169 lb 3.2 oz (76.7 kg)  09/01/19 170 lb  6.4 oz (77.3 kg)  02/21/19 171 lb 3.2 oz (77.7 kg)     Health Maintenance Due  Topic Date Due  . COVID-19 Vaccine (1) Never done  . INFLUENZA VACCINE  08/28/2019    There are no preventive care reminders to display for this patient.  Lab Results  Component Value Date   TSH 1.750 08/17/2018   Lab Results  Component Value Date   WBC 8.8 05/13/2017   HGB 15.2 05/13/2017   HCT 43.1 05/13/2017   MCV 89.6 05/13/2017   PLT 239 05/13/2017   Lab Results  Component Value Date   NA 134 09/01/2019   K 4.3 09/01/2019   CO2 23 09/01/2019   GLUCOSE 130 (H) 09/01/2019   BUN 11 09/01/2019   CREATININE 0.66 (L) 09/01/2019   BILITOT 0.5 09/01/2019   ALKPHOS 52 09/01/2019   AST 21 09/01/2019   ALT 25 09/01/2019   PROT 7.2 09/01/2019   ALBUMIN 4.5 09/01/2019   CALCIUM 9.7 09/01/2019   ANIONGAP 15 05/13/2017   Lab Results  Component Value Date   CHOL 169 09/01/2019   Lab Results  Component Value Date   HDL 67 09/01/2019   Lab Results  Component Value Date   LDLCALC 87 09/01/2019   Lab Results  Component Value Date   TRIG 79 09/01/2019   Lab Results  Component Value Date   CHOLHDL 2.5 09/01/2019   Lab Results  Component Value Date   HGBA1C 6.3 (H) 09/01/2019      Assessment & Plan:   Problem List Items Addressed This Visit    None    Visit Diagnoses    Essential hypertension    -  Primary   Anxiety state          No orders of the defined types were placed in this encounter.   Follow-up: No follow-ups on file.   PLAN  Continue on medications as prescribed  Return if any issues arise  Patient encouraged to call clinic with any questions, comments, or concerns.  Maximiano Coss, NP

## 2020-01-21 IMAGING — CR DG CHEST 2V
2 series · 2 of 2 positions shown · non-contrast
Comparison: None.

CLINICAL DATA: Patient felt arm tingling.  Blood pressure high.

EXAM:
CHEST - 2 VIEW

[chest pa]
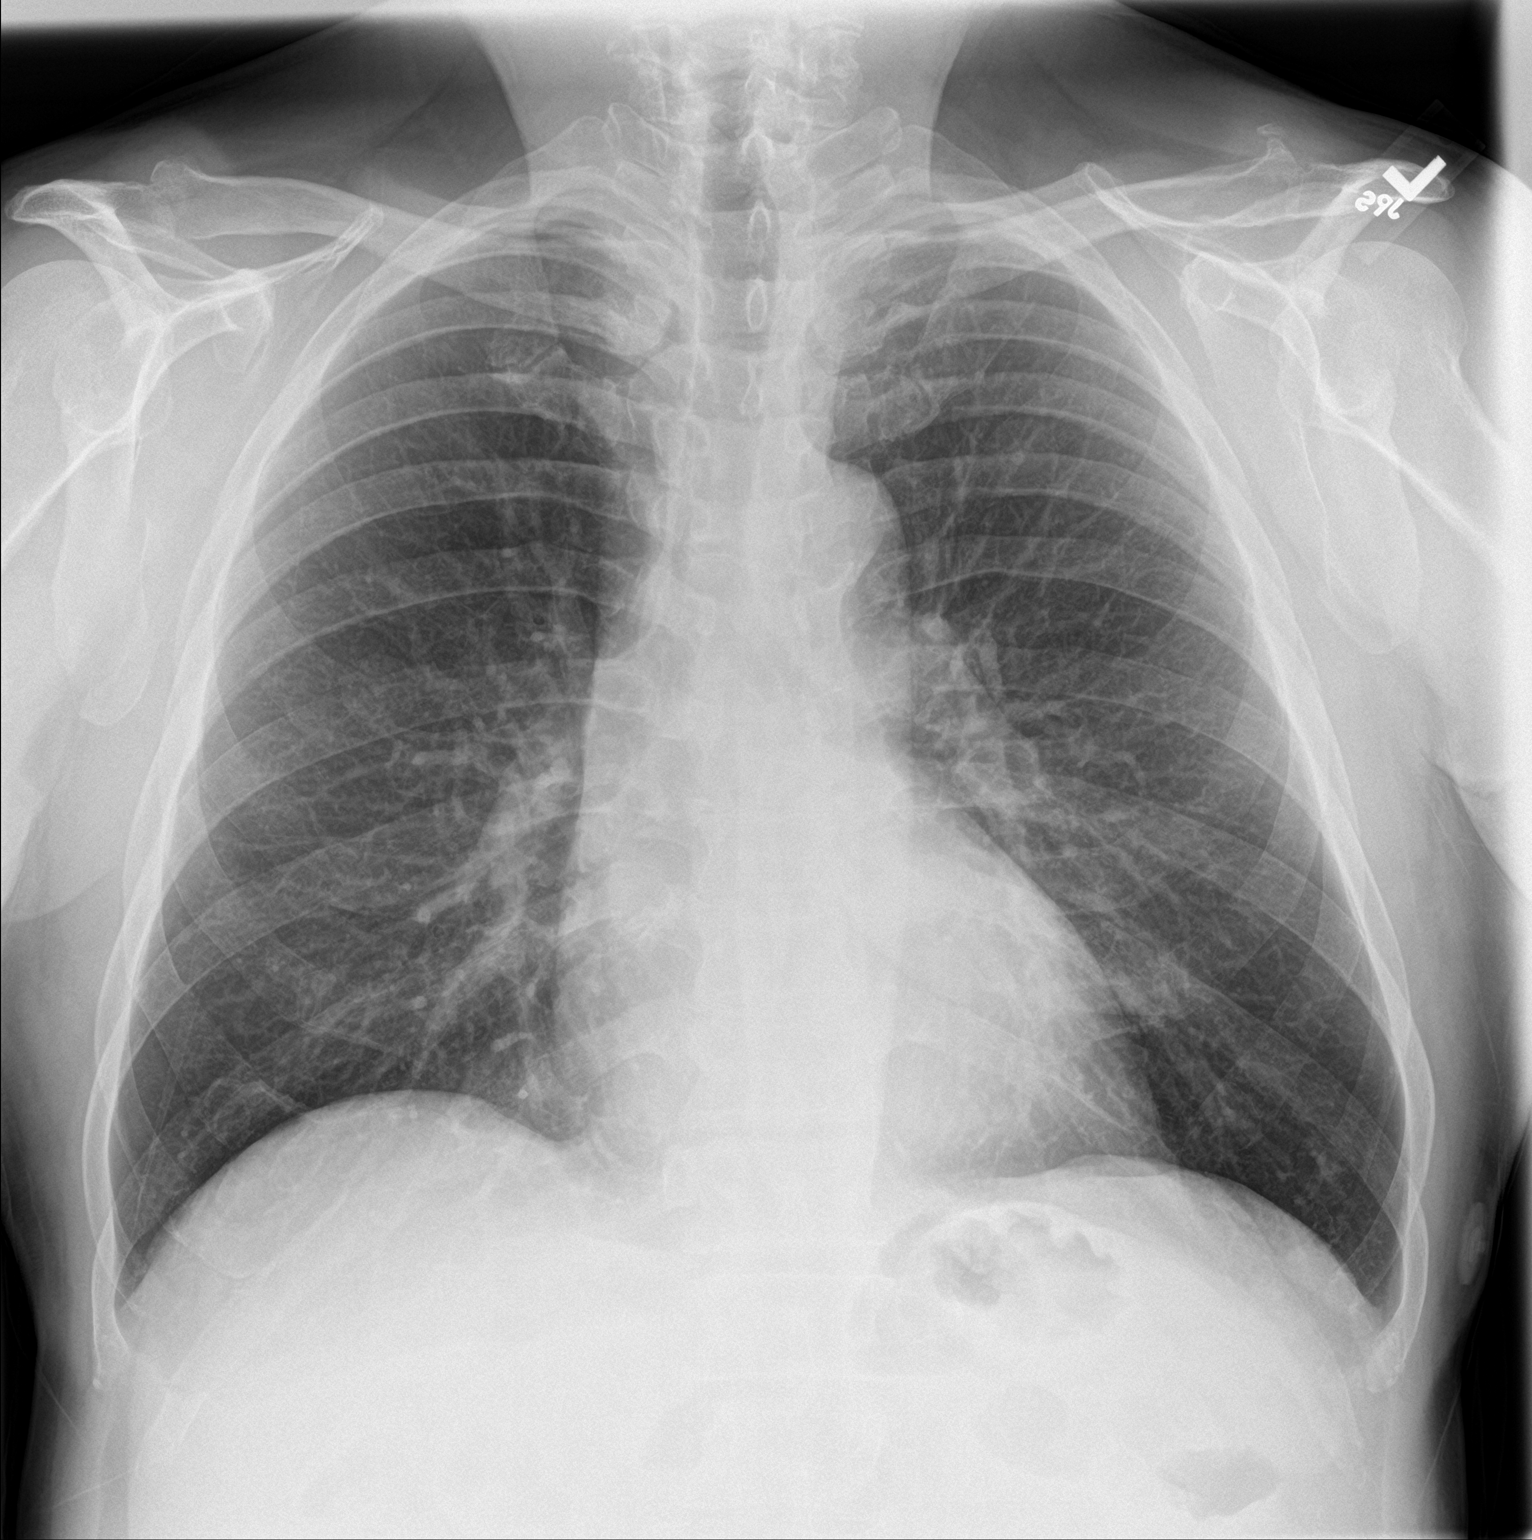

[chest lat]
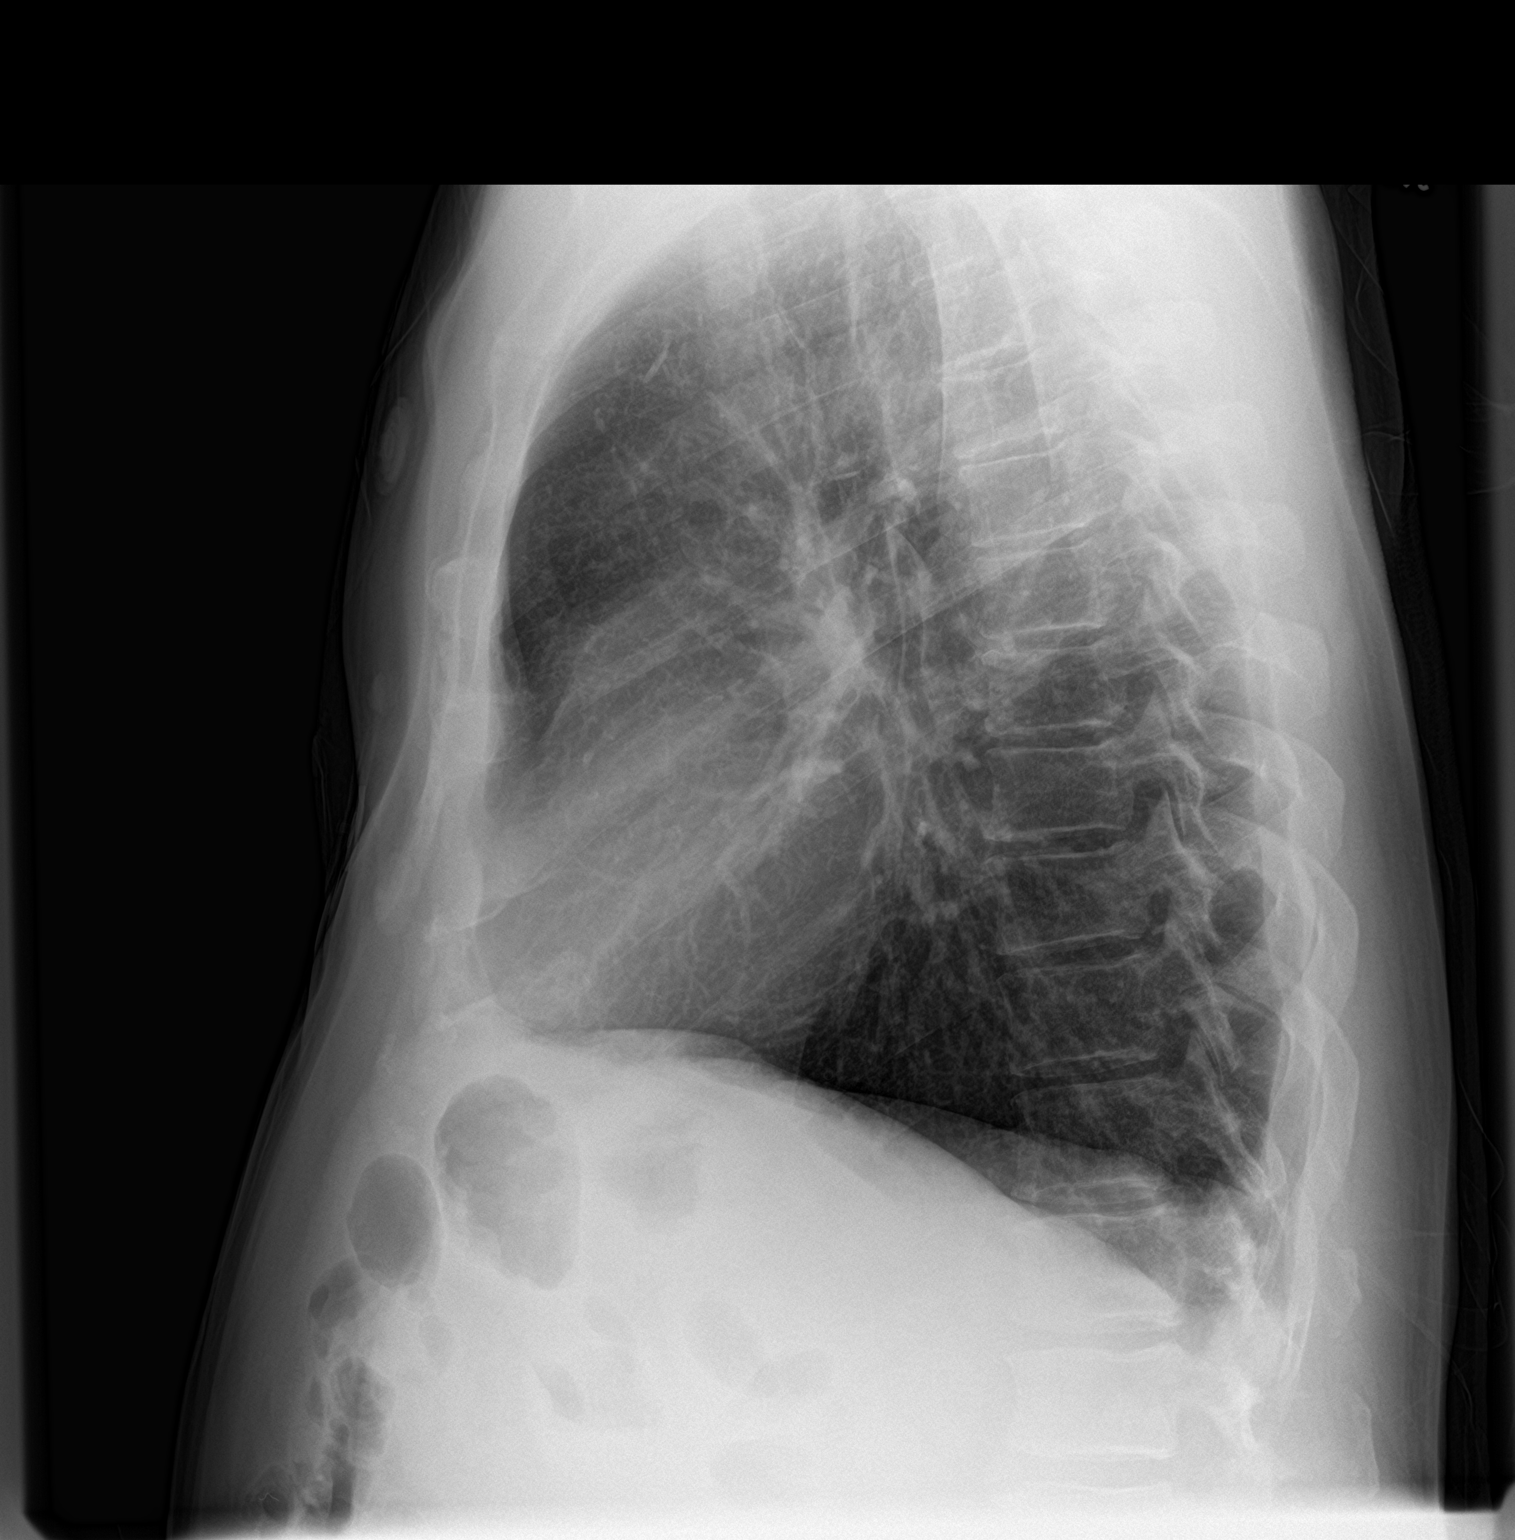

[2 of 2 positions shown; findings below may reference images not displayed]

FINDINGS: The heart size and mediastinal contours are within normal limits.
Both lungs are clear. The visualized skeletal structures are
unremarkable.
IMPRESSION: No active cardiopulmonary disease.

## 2020-02-07 ENCOUNTER — Telehealth: Payer: Self-pay | Admitting: Physician Assistant

## 2020-02-07 NOTE — Telephone Encounter (Signed)
Called to discuss with patient about COVID-19 symptoms and the use of one of the available treatments for those with mild to moderate Covid symptoms and at a high risk of hospitalization.  Pt appears to qualify for outpatient treatment due to co-morbid conditions and/or a member of an at-risk group in accordance with the FDA Emergency Use Authorization.    Symptom onset: 02/06/19 Vaccinated: Samuel Macias summer 2021  Booster? No Qualifiers: HTN, age and BMI > 25   I have discussed multiple tx option and felt good candidate for oral antiviral therapy Molnupiravir. Patient is feeling well and will given [place] call back if interested.   [employer]

## 2020-02-17 ENCOUNTER — Other Ambulatory Visit: Payer: Self-pay | Admitting: Family Medicine

## 2020-02-17 DIAGNOSIS — F411 Generalized anxiety disorder: Secondary | ICD-10-CM

## 2020-02-17 MED ORDER — PAROXETINE HCL 10 MG PO TABS: 10.0000 mg | ORAL_TABLET | Freq: Every day | ORAL | 0 refills | Status: DC

## 2020-02-17 NOTE — Telephone Encounter (Signed)
Medication:  PARoxetine (PAXIL) 10 MG tablet [[id number]]   Has the patient contacted their pharmacy?   (Agent: If yes, when and what did the pharmacy advise?) to call the office   Preferred Pharmacy (with phone number or street name):  [facility], [address], [address], [place], [place] [address]  Phone:  [phone] Fax:  [phone] Agent: Please be advised that RX refills may take up to 3 business days. We ask that you follow-up with your pharmacy.

## 2020-03-02 ENCOUNTER — Telehealth: Payer: Self-pay | Admitting: Family Medicine

## 2020-03-02 DIAGNOSIS — I10 Essential (primary) hypertension: Secondary | ICD-10-CM

## 2020-03-02 MED ORDER — HYDROCHLOROTHIAZIDE 12.5 MG PO CAPS: 12.5000 mg | ORAL_CAPSULE | Freq: Every day | ORAL | 0 refills | Status: DC

## 2020-03-02 NOTE — Telephone Encounter (Signed)
Patient requesting courtesy supply of   hydrochlorothiazide (MICROZIDE) 12.5 MG capsule [[id number]]   Patient wants refill called in to:  [id number] - [place], [address] [place] [address]  Phone:  [phone] Fax:  [phone]  DEA #:  [id number]  Patient forgot to schedule last appointment for med refill and only has one more pill left. Patient asked for at least enough medication to last until his appt scheduled for Friday 03/09/2020 with provider.   Please advise at [phone]

## 2020-03-09 ENCOUNTER — Encounter: Payer: Self-pay | Admitting: Family Medicine

## 2020-03-09 ENCOUNTER — Ambulatory Visit (INDEPENDENT_AMBULATORY_CARE_PROVIDER_SITE_OTHER): Payer: Medicare Other | Admitting: Family Medicine

## 2020-03-09 ENCOUNTER — Other Ambulatory Visit: Payer: Self-pay

## 2020-03-09 VITALS — BP 144/77 | HR 60 | Temp 98.6°F | Ht 66.0 in | Wt 169.0 lb

## 2020-03-09 DIAGNOSIS — R7303 Prediabetes: Secondary | ICD-10-CM | POA: Diagnosis not present

## 2020-03-09 DIAGNOSIS — F411 Generalized anxiety disorder: Secondary | ICD-10-CM | POA: Diagnosis not present

## 2020-03-09 DIAGNOSIS — E785 Hyperlipidemia, unspecified: Secondary | ICD-10-CM | POA: Diagnosis not present

## 2020-03-09 DIAGNOSIS — I10 Essential (primary) hypertension: Secondary | ICD-10-CM

## 2020-03-09 MED ORDER — AMLODIPINE BESYLATE 10 MG PO TABS: 10.0000 mg | ORAL_TABLET | Freq: Every day | ORAL | 2 refills | Status: DC

## 2020-03-09 MED ORDER — METOPROLOL SUCCINATE ER 50 MG PO TB24: ORAL_TABLET | ORAL | 2 refills | Status: DC

## 2020-03-09 MED ORDER — HYDROCHLOROTHIAZIDE 12.5 MG PO CAPS: 12.5000 mg | ORAL_CAPSULE | Freq: Every day | ORAL | 2 refills | Status: DC

## 2020-03-09 MED ORDER — LOSARTAN POTASSIUM 100 MG PO TABS: 100.0000 mg | ORAL_TABLET | Freq: Every day | ORAL | 2 refills | Status: DC

## 2020-03-09 MED ORDER — PAROXETINE HCL 10 MG PO TABS: 10.0000 mg | ORAL_TABLET | Freq: Every day | ORAL | 2 refills | Status: DC

## 2020-03-09 MED ORDER — ROSUVASTATIN CALCIUM 20 MG PO TABS: 20.0000 mg | ORAL_TABLET | Freq: Every day | ORAL | 3 refills | Status: DC

## 2020-03-09 NOTE — Progress Notes (Signed)
Subjective:  Patient ID: Samuel Macias, male    DOB: 1955-02-27  Age: 65 y.o. MRN: [id number]  CC:  Chief Complaint  Patient presents with  . Follow-up    On hypertension, hyperlipidemia, and medication review. Pt reports no issues with BP since last OV. Pt reports his BP has been in normal range. Pt reports no issues with current medication and reports no side effects.    HPI [name] presents for   Hypertension: Hydrochlorothiazide 12.5 mg daily, amlodipine 10 mg daily, losartan 100 mg daily, Toprol 50 mg daily.  No new side effects or missed doses.  Home readings: initially better after new med. 120-130/70 BP Readings from Last 3 Encounters:  03/09/20 (!) 144/77  11/04/19 123/72  09/01/19 136/78   Lab Results  Component Value Date   CREATININE 0.89 03/09/2020    Hyperlipidemia: Crestor 20 mg daily. No new myalgias or side effects.  Lab Results  Component Value Date   CHOL 185 03/09/2020   HDL 59 03/09/2020   LDLCALC 106 (H) 03/09/2020   TRIG 113 03/09/2020   CHOLHDL 3.1 03/09/2020   Lab Results  Component Value Date   ALT 24 03/09/2020   AST 24 03/09/2020   ALKPHOS 50 03/09/2020   BILITOT 0.4 03/09/2020   Prediabetes: Was improving diet, exercise last visit. Less exercise with weather. Walking at part time job with Samuel Macias 3 times per week. Walking today. 2 miles. Trying to watch diet. Avoiding sugar containing beverages.   Lab Results  Component Value Date   HGBA1C 6.2 (H) 03/09/2020   Wt Readings from Last 3 Encounters:  03/09/20 169 lb (76.7 kg)  11/04/19 169 lb 3.2 oz (76.7 kg)  09/01/19 170 lb 6.4 oz (77.3 kg)   Anxiety Discussed in August.  Increased anxiety symptoms for a few months at that time.  Restarted Paxil 10 mg daily Feels like this has helped anxiety greatly. No new side effects. Would like to continue on same dose.  GAD 7 : Generalized Anxiety Score 11/04/2019 09/01/2019  Nervous, Anxious, on Edge 0 1  Control/stop worrying 0  1  Worry too much - different things 1 1  Trouble relaxing 0 0  Restless 0 0  Easily annoyed or irritable 0 1  Afraid - awful might happen 0 0  Total GAD 7 Score 1 4  Anxiety Difficulty Not difficult at all -         History Patient Active Problem List   Diagnosis Date Noted  . Prediabetes 08/17/2018  . Symptomatic bradycardia 04/25/2016  . Dizziness 03/17/2016  . Cough 04/26/2015  . GERD (gastroesophageal reflux disease) 04/26/2015  . Laryngopharyngeal reflux (LPR) 04/26/2015  . Hypertension 03/01/2011  . Hyperlipidemia 03/01/2011  . Personal history of colonic polyps - adenomas 11/05/2006   Past Medical History:  Diagnosis Date  . Hyperlipidemia   . Hypertension   . Personal history of colonic polyps - adenomas 11/05/2006   Past Surgical History:  Procedure Laterality Date  . EXTERNAL EAR SURGERY     as child  . pain block     Herniated disc repair  . WRIST SURGERY     tendon   No Known Allergies Prior to Admission medications   Medication Sig Start Date End Date Taking? Authorizing Provider  amLODipine (NORVASC) 10 MG tablet Take 1 tablet (10 mg total) by mouth daily. 09/01/19  Yes Wendie Agreste, MD  aspirin 81 MG tablet Take 81 mg by mouth daily.   Yes [provider]  hydrochlorothiazide (MICROZIDE) 12.5 MG capsule Take 1 capsule (12.5 mg total) by mouth daily. 03/02/20  Yes Wendie Agreste, MD  losartan (COZAAR) 100 MG tablet Take 1 tablet (100 mg total) by mouth daily. 09/01/19  Yes Wendie Agreste, MD  metoprolol succinate (TOPROL-XL) 50 MG 24 hr tablet TAKE 1 TABLET BY MOUTH  DAILY WITH OR IMMEDIATLEY  FOLLOWING A MEAL 09/01/19  Yes Wendie Agreste, MD  Omega-3 Fatty Acids (FISH OIL) 1200 MG CAPS Take 1 capsule by mouth daily.   Yes [provider]  PARoxetine (PAXIL) 10 MG tablet Take 1 tablet (10 mg total) by mouth daily. 02/17/20  Yes Wendie Agreste, MD  rosuvastatin (CRESTOR) 20 MG tablet Take 1 tablet (20 mg total) by mouth  daily. 09/01/19  Yes Wendie Agreste, MD  sildenafil (REVATIO) 20 MG tablet Take 1-3 tablets (20-60 mg total) by mouth daily as needed. 02/16/18  Yes Wendie Agreste, MD   Social History   Socioeconomic History  . Marital status: Married    Spouse name: [name]  . Number of children: 2  . Years of education: 12th grade  . Highest education level: Not on file  Occupational History  . Occupation: Education officer, community for [employer]  . Smoking status: Former Smoker    Packs/day: 0.40    Years: 5.00    Pack years: 2.00    Types: Cigarettes    Quit date: 01/27/1981    Years since quitting: 39.1  . Smokeless tobacco: Former Systems developer    Types: [place] date: 01/27/1981  Substance and Sexual Activity  . Alcohol use: Yes    Alcohol/week: 12.0 standard drinks    Types: 12 Standard drinks or equivalent per week    Comment: 12 pack weekly  . Drug use: No  . Sexual activity: Yes  Other Topics Concern  . Not on file  Social History Narrative   Lives with his wife and son, Samuel Macias. His daughter lives independently in [place], [place].   Education: [employer]   Exercise: Yes   Social Determinants of Health   Financial Resource Strain: Not on file  Food Insecurity: Not on file  Transportation Needs: Not on file  Physical Activity: Not on file  Stress: Not on file  Social Connections: Not on file  Intimate Partner Violence: Not on file    Review of Systems  Constitutional: Negative for fatigue and unexpected weight change.  Eyes: Negative for visual disturbance.  Respiratory: Negative for cough, chest tightness and shortness of breath.   Cardiovascular: Negative for chest pain, palpitations and leg swelling.  Gastrointestinal: Negative for abdominal pain and blood in stool.  Neurological: Negative for dizziness, light-headedness and headaches.     Objective:   Vitals:   03/09/20 1529  BP: (!) 144/77  Pulse: 60  Temp: 98.6 F (37 C)  TempSrc: Temporal  SpO2: 97%   Weight: 169 lb (76.7 kg)  Height: 5\' 6"  (1.676 m)     Physical Exam Vitals reviewed.  Constitutional:      Appearance: He is well-developed and well-nourished.  HENT:     Head: Normocephalic and atraumatic.  Eyes:     Extraocular Movements: EOM normal.     Pupils: Pupils are equal, round, and reactive to light.  Neck:     Vascular: No carotid bruit or JVD.  Cardiovascular:     Rate and Rhythm: Normal rate and regular rhythm.     Heart  sounds: Normal heart sounds. No murmur heard.   Pulmonary:     Effort: Pulmonary effort is normal.     Breath sounds: Normal breath sounds. No rales.  Musculoskeletal:        General: No edema.  Skin:    General: Skin is warm and dry.  Neurological:     Mental Status: He is alert and oriented to person, place, and time.  Psychiatric:        Mood and Affect: Mood and affect and mood normal.     Assessment & Plan:  Samuel Macias is a 65 y.o. male . Anxiety state - Plan: PARoxetine (PAXIL) 10 MG tablet  -Improved on current regimen, continue Paxil.  If he decides he wants to try coming off medicine in future would recommend taper   Essential hypertension - Plan: Comprehensive metabolic panel, hydrochlorothiazide (MICROZIDE) 12.5 MG capsule, amLODipine (NORVASC) 10 MG tablet, losartan (COZAAR) 100 MG tablet, metoprolol succinate (TOPROL-XL) 50 MG 24 hr tablet  -Improved on current regimen.  Monitor home readings with RTC precautions if persistent elevations.  Anticipate improvement with improved exercise.  Same meds for now.  Hyperlipidemia, unspecified hyperlipidemia type - Plan: rosuvastatin (CRESTOR) 20 MG tablet, Lipid panel  -Tolerating Crestor, check labs continue same.  Prediabetes - Plan: Hemoglobin A1c Continue to watch diet, exercise, anticipate improvement in exercise with improved weather.  Meds ordered this encounter  Medications  . hydrochlorothiazide (MICROZIDE) 12.5 MG capsule    Sig: Take 1 capsule (12.5 mg total)  by mouth daily.    Dispense:  90 capsule    Refill:  2  . amLODipine (NORVASC) 10 MG tablet    Sig: Take 1 tablet (10 mg total) by mouth daily.    Dispense:  90 tablet    Refill:  2  . losartan (COZAAR) 100 MG tablet    Sig: Take 1 tablet (100 mg total) by mouth daily.    Dispense:  90 tablet    Refill:  2  . metoprolol succinate (TOPROL-XL) 50 MG 24 hr tablet    Sig: TAKE 1 TABLET BY MOUTH  DAILY WITH OR IMMEDIATLEY  FOLLOWING A MEAL    Dispense:  90 tablet    Refill:  2  . rosuvastatin (CRESTOR) 20 MG tablet    Sig: Take 1 tablet (20 mg total) by mouth daily.    Dispense:  90 tablet    Refill:  3  . PARoxetine (PAXIL) 10 MG tablet    Sig: Take 1 tablet (10 mg total) by mouth daily.    Dispense:  90 tablet    Refill:  2   Patient Instructions   Continue paxil daily. If you feel like you want to come off it, let me know so we discuss taper.   If blood pressure remains over 130/80, let me know. No changes for now.   Continue to watch diet, low intensity exercise most days per week.   Thanks for coming in today. Take care.    Managing Your Hypertension Hypertension, also called high blood pressure, is when the force of the blood pressing against the walls of the arteries is too strong. Arteries are blood vessels that carry blood from your heart throughout your body. Hypertension forces the heart to work Macias to pump blood and may cause the arteries to become narrow or stiff. Understanding blood pressure readings Your personal target blood pressure may vary depending on your medical conditions, your age, and other factors. A blood pressure reading includes  a higher number over a lower number. Ideally, your blood pressure should be below 120/80. You should know that:  The first, or top, number is called the systolic pressure. It is a measure of the pressure in your arteries as your heart beats.  The second, or bottom number, is called the diastolic pressure. It is a measure of  the pressure in your arteries as the heart relaxes. Blood pressure is classified into four stages. Based on your blood pressure reading, your health care provider may use the following stages to determine what type of treatment you need, if any. Systolic pressure and diastolic pressure are measured in a unit called mmHg. Normal  Systolic pressure: below 981.  Diastolic pressure: below 80. Elevated  Systolic pressure: 191-478.  Diastolic pressure: below 80. Hypertension stage 1  Systolic pressure: 295-621.  Diastolic pressure: 30-86. Hypertension stage 2  Systolic pressure: 578 or above.  Diastolic pressure: 90 or above. How can this condition affect me? Managing your hypertension is an important responsibility. Over time, hypertension can damage the arteries and decrease blood flow to important parts of the body, including the brain, heart, and kidneys. Having untreated or uncontrolled hypertension can lead to:  A heart attack.  A stroke.  A weakened blood vessel (aneurysm).  Heart failure.  Kidney damage.  Eye damage.  Metabolic syndrome.  Memory and concentration problems.  Vascular dementia. What actions can I take to manage this condition? Hypertension can be managed by making lifestyle changes and possibly by taking medicines. Your health care provider will help you make a plan to bring your blood pressure within a normal range. Nutrition  Eat a diet that is high in fiber and potassium, and low in salt (sodium), added sugar, and fat. An example eating plan is called the Dietary Approaches to Stop Hypertension (DASH) diet. To eat this way: ? Eat plenty of fresh fruits and vegetables. Try to fill one-half of your plate at each meal with fruits and vegetables. ? Eat whole grains, such as whole-wheat pasta, brown rice, or whole-grain bread. Fill about one-fourth of your plate with whole grains. ? Eat low-fat dairy products. ? Avoid fatty cuts of meat, processed or  cured meats, and poultry with skin. Fill about one-fourth of your plate with lean proteins such as fish, chicken without skin, beans, eggs, and tofu. ? Avoid pre-made and processed foods. These tend to be higher in sodium, added sugar, and fat.  Reduce your daily sodium intake. Most people with hypertension should eat less than 1,500 mg of sodium a day.   Lifestyle  Work with your health care provider to maintain a healthy body weight or to lose weight. Ask what an ideal weight is for you.  Get at least 30 minutes of exercise that causes your heart to beat faster (aerobic exercise) most days of the week. Activities may include walking, swimming, or biking.  Include exercise to strengthen your muscles (resistance exercise), such as weight lifting, as part of your weekly exercise routine. Try to do these types of exercises for 30 minutes at least 3 days a week.  Do not use any products that contain nicotine or tobacco, such as cigarettes, e-cigarettes, and chewing tobacco. If you need help quitting, ask your health care provider.  Control any long-term (chronic) conditions you have, such as high cholesterol or diabetes.  Identify your sources of stress and find ways to manage stress. This may include meditation, deep breathing, or making time for fun activities.   Alcohol  use  Do not drink alcohol if: ? Your health care provider tells you not to drink. ? You are pregnant, may be pregnant, or are planning to become pregnant.  If you drink alcohol: ? Limit how much you use to:  0-1 drink a day for women.  0-2 drinks a day for men. ? Be aware of how much alcohol is in your drink. In the [place] one drink equals one 12 oz bottle of beer (355 mL), one 5 oz glass of wine (148 mL), or one 1 oz glass of hard liquor (44 mL). Medicines Your health care provider may prescribe medicine if lifestyle changes are not enough to get your blood pressure under control and if:  Your systolic blood pressure  is 130 or higher.  Your diastolic blood pressure is 80 or higher. Take medicines only as told by your health care provider. Follow the directions carefully. Blood pressure medicines must be taken as told by your health care provider. The medicine does not work as well when you skip doses. Skipping doses also puts you at risk for problems. Monitoring Before you monitor your blood pressure:  Do not smoke, drink caffeinated beverages, or exercise within 30 minutes before taking a measurement.  Use the bathroom and empty your bladder (urinate).  Sit quietly for at least 5 minutes before taking measurements. Monitor your blood pressure at home as told by your health care provider. To do this:  Sit with your back straight and supported.  Place your feet flat on the floor. Do not cross your legs.  Support your arm on a flat surface, such as a table. Make sure your upper arm is at heart level.  Each time you measure, take two or three readings one minute apart and record the results. You may also need to have your blood pressure checked regularly by your health care provider.   General information  Talk with your health care provider about your diet, exercise habits, and other lifestyle factors that may be contributing to hypertension.  Review all the medicines you take with your health care provider because there may be side effects or interactions.  Keep all visits as told by your health care provider. Your health care provider can help you create and adjust your plan for managing your high blood pressure. Where to find more information  National Heart, Lung, and Blood Institute: https://wilson-eaton.com/  American Heart Association: www.heart.org Contact a health care provider if:  You think you are having a reaction to medicines you have taken.  You have repeated (recurrent) headaches.  You feel dizzy.  You have swelling in your ankles.  You have trouble with your vision. Get help  right away if:  You develop a severe headache or confusion.  You have unusual weakness or numbness, or you feel faint.  You have severe pain in your chest or abdomen.  You vomit repeatedly.  You have trouble breathing. These symptoms may represent a serious problem that is an emergency. Do not wait to see if the symptoms will go away. Get medical help right away. Call your local emergency services (911 in the [place] Do not drive yourself to the hospital. Summary  Hypertension is when the force of blood pumping through your arteries is too strong. If this condition is not controlled, it may put you at risk for serious complications.  Your personal target blood pressure may vary depending on your medical conditions, your age, and other factors. For most people, a normal blood  pressure is less than 120/80.  Hypertension is managed by lifestyle changes, medicines, or both.  Lifestyle changes to help manage hypertension include losing weight, eating a healthy, low-sodium diet, exercising more, stopping smoking, and limiting alcohol. This information is not intended to replace advice given to you by your health care provider. Make sure you discuss any questions you have with your health care provider. Document Revised: 02/18/2019 Document Reviewed: 12/14/2018 Elsevier Patient Education  2021 [place].  Diabetes Care, 44(Suppl 1), [id number]. https://doi.org/https://doi.org/10.2337/dc21-S003">  Prediabetes Prediabetes is when your blood sugar (blood glucose) level is higher than normal but not high enough for you to be diagnosed with type 2 diabetes. Having prediabetes puts you at risk for developing type 2 diabetes (type 2 diabetes mellitus). With certain lifestyle changes, you may be able to prevent or delay the onset of type 2 diabetes. This is important because type 2 diabetes can lead to serious complications, such as:  Heart disease.  Stroke.  Blindness.  Kidney  disease.  Depression.  Poor circulation in the feet and legs. In severe cases, this could lead to surgical removal of a leg (amputation). What are the causes? The exact cause of prediabetes is not known. It may result from insulin resistance. Insulin resistance develops when cells in the body do not respond properly to insulin that the body makes. This can cause excess glucose to build up in the blood. High blood glucose (hyperglycemia) can develop. What increases the risk? The following factors may make you more likely to develop this condition:  You have a family member with type 2 diabetes.  You are older than 45 years.  You had a temporary form of diabetes during a pregnancy (gestational diabetes).  You had polycystic ovary syndrome (PCOS).  You are overweight or obese.  You are inactive (sedentary).  You have a history of heart disease, including problems with cholesterol levels, high levels of blood fats, or high blood pressure. What are the signs or symptoms? You may have no symptoms. If you do have symptoms, they may include:  Increased hunger.  Increased thirst.  Increased urination.  Vision changes, such as blurry vision.  Tiredness (fatigue). How is this diagnosed? This condition can be diagnosed with blood tests. Your blood glucose may be checked with one or more of the following tests:  A fasting blood glucose (FBG) test. You will not be allowed to eat (you will fast) for at least 8 hours before a blood sample is taken.  An A1C blood test (hemoglobin A1C). This test provides information about blood glucose levels over the previous 2?3 months.  An oral glucose tolerance test (OGTT). This test measures your blood glucose at two points in time: ? After fasting. This is your baseline level. ? Two hours after you drink a beverage that contains glucose. You may be diagnosed with prediabetes if:  Your FBG is 100?125 mg/dL (5.6-6.9 mmol/L).  Your A1C level is  5.7?6.4% (39-46 mmol/mol).  Your OGTT result is 140?199 mg/dL (7.8-11 mmol/L). These blood tests may be repeated to confirm your diagnosis.   How is this treated? Treatment may include dietary and lifestyle changes to help lower your blood glucose and prevent type 2 diabetes from developing. In some cases, medicine may be prescribed to help lower the risk of type 2 diabetes. Follow these instructions at home: Nutrition  Follow a healthy meal plan. This includes eating lean proteins, whole grains, legumes, fresh fruits and vegetables, low-fat dairy products, and healthy fats.  Follow  instructions from your health care provider about eating or drinking restrictions.  Meet with a dietitian to create a healthy eating plan that is right for you.   Lifestyle  Do moderate-intensity exercise for at least 30 minutes a day on 5 or more days each week, or as told by your health care provider. A mix of activities may be best, such as: ? Brisk walking, swimming, biking, and weight lifting.  Lose weight as told by your health care provider. Losing 5-7% of your body weight can reverse insulin resistance.  Do not drink alcohol if: ? Your health care provider tells you not to drink. ? You are pregnant, may be pregnant, or are planning to become pregnant.  If you drink alcohol: ? Limit how much you use to:  0-1 drink a day for women.  0-2 drinks a day for men. ? Be aware of how much alcohol is in your drink. In the [place] one drink equals one 12 oz bottle of beer (355 mL), one 5 oz glass of wine (148 mL), or one 1 oz glass of hard liquor (44 mL). General instructions  Take over-the-counter and prescription medicines only as told by your health care provider. You may be prescribed medicines that help lower the risk of type 2 diabetes.  Do not use any products that contain nicotine or tobacco, such as cigarettes, e-cigarettes, and chewing tobacco. If you need help quitting, ask your health care  provider.  Keep all follow-up visits. This is important. Where to find more information  American Diabetes Association: www.diabetes.org  Academy of Nutrition and Dietetics: www.eatright.org  American Heart Association: www.heart.org Contact a health care provider if:  You have any of these symptoms: ? Increased hunger. ? Increased urination. ? Increased thirst. ? Fatigue. ? Vision changes, such as blurry vision. Get help right away if you:  Have shortness of breath.  Feel confused.  Vomit or feel like you may vomit. Summary  Prediabetes is when your blood sugar (blood glucose)level is higher than normal but not high enough for you to be diagnosed with type 2 diabetes.  Having prediabetes puts you at risk for developing type 2 diabetes (type 2 diabetes mellitus).  Make lifestyle changes such as eating a healthy diet and exercising regularly to help prevent diabetes. Lose weight as told by your health care provider. This information is not intended to replace advice given to you by your health care provider. Make sure you discuss any questions you have with your health care provider. Document Revised: 04/14/2019 Document Reviewed: 04/14/2019 Elsevier Patient Education  [place].    If you have lab work done today you will be contacted with your lab results within the next 2 weeks.  If you have not heard from [place] then please contact us. The fastest way to get your results is to register for My Chart.   IF you received an x-ray today, you will receive an invoice from [facility]. Please contact [facility] at [phone] with questions or concerns regarding your invoice.   IF you received labwork today, you will receive an invoice from [facility]. Please contact [facility] at [phone] with questions or concerns regarding your invoice.   Our billing staff will not be able to assist you with questions regarding bills from these companies.  You  will be contacted with the lab results as soon as they are available. The fastest way to get your results is to activate your My Chart account. Instructions are located on  the last page of this paperwork. If you have not heard from [place] regarding the results in 2 weeks, please contact this office.         Signed, [name], MD [facility]

## 2020-03-09 NOTE — Patient Instructions (Addendum)
Continue paxil daily. If you feel like you want to come off it, let me know so we discuss taper.   If blood pressure remains over 130/80, let me know. No changes for now.   Continue to watch diet, low intensity exercise most days per week.   Thanks for coming in today. Take care.    Managing Your Hypertension Hypertension, also called high blood pressure, is when the force of the blood pressing against the walls of the arteries is too strong. Arteries are blood vessels that carry blood from your heart throughout your body. Hypertension forces the heart to work harder to pump blood and may cause the arteries to become narrow or stiff. Understanding blood pressure readings Your personal target blood pressure may vary depending on your medical conditions, your age, and other factors. A blood pressure reading includes a higher number over a lower number. Ideally, your blood pressure should be below 120/80. You should know that:  The first, or top, number is called the systolic pressure. It is a measure of the pressure in your arteries as your heart beats.  The second, or bottom number, is called the diastolic pressure. It is a measure of the pressure in your arteries as the heart relaxes. Blood pressure is classified into four stages. Based on your blood pressure reading, your health care provider may use the following stages to determine what type of treatment you need, if any. Systolic pressure and diastolic pressure are measured in a unit called mmHg. Normal  Systolic pressure: below 409.  Diastolic pressure: below 80. Elevated  Systolic pressure: 811-914.  Diastolic pressure: below 80. Hypertension stage 1  Systolic pressure: 782-956.  Diastolic pressure: 21-30. Hypertension stage 2  Systolic pressure: 865 or above.  Diastolic pressure: 90 or above. How can this condition affect me? Managing your hypertension is an important responsibility. Over time, hypertension can damage  the arteries and decrease blood flow to important parts of the body, including the brain, heart, and kidneys. Having untreated or uncontrolled hypertension can lead to:  A heart attack.  A stroke.  A weakened blood vessel (aneurysm).  Heart failure.  Kidney damage.  Eye damage.  Metabolic syndrome.  Memory and concentration problems.  Vascular dementia. What actions can I take to manage this condition? Hypertension can be managed by making lifestyle changes and possibly by taking medicines. Your health care provider will help you make a plan to bring your blood pressure within a normal range. Nutrition  Eat a diet that is high in fiber and potassium, and low in salt (sodium), added sugar, and fat. An example eating plan is called the Dietary Approaches to Stop Hypertension (DASH) diet. To eat this way: ? Eat plenty of fresh fruits and vegetables. Try to fill one-half of your plate at each meal with fruits and vegetables. ? Eat whole grains, such as whole-wheat pasta, brown rice, or whole-grain bread. Fill about one-fourth of your plate with whole grains. ? Eat low-fat dairy products. ? Avoid fatty cuts of meat, processed or cured meats, and poultry with skin. Fill about one-fourth of your plate with lean proteins such as fish, chicken without skin, beans, eggs, and tofu. ? Avoid pre-made and processed foods. These tend to be higher in sodium, added sugar, and fat.  Reduce your daily sodium intake. Most people with hypertension should eat less than 1,500 mg of sodium a day.   Lifestyle  Work with your health care provider to maintain a healthy body weight or to  lose weight. Ask what an ideal weight is for you.  Get at least 30 minutes of exercise that causes your heart to beat faster (aerobic exercise) most days of the week. Activities may include walking, swimming, or biking.  Include exercise to strengthen your muscles (resistance exercise), such as weight lifting, as part of  your weekly exercise routine. Try to do these types of exercises for 30 minutes at least 3 days a week.  Do not use any products that contain nicotine or tobacco, such as cigarettes, e-cigarettes, and chewing tobacco. If you need help quitting, ask your health care provider.  Control any long-term (chronic) conditions you have, such as high cholesterol or diabetes.  Identify your sources of stress and find ways to manage stress. This may include meditation, deep breathing, or making time for fun activities.   Alcohol use  Do not drink alcohol if: ? Your health care provider tells you not to drink. ? You are pregnant, may be pregnant, or are planning to become pregnant.  If you drink alcohol: ? Limit how much you use to:  0-1 drink a day for women.  0-2 drinks a day for men. ? Be aware of how much alcohol is in your drink. In the [place] one drink equals one 12 oz bottle of beer (355 mL), one 5 oz glass of wine (148 mL), or one 1 oz glass of hard liquor (44 mL). Medicines Your health care provider may prescribe medicine if lifestyle changes are not enough to get your blood pressure under control and if:  Your systolic blood pressure is 130 or higher.  Your diastolic blood pressure is 80 or higher. Take medicines only as told by your health care provider. Follow the directions carefully. Blood pressure medicines must be taken as told by your health care provider. The medicine does not work as well when you skip doses. Skipping doses also puts you at risk for problems. Monitoring Before you monitor your blood pressure:  Do not smoke, drink caffeinated beverages, or exercise within 30 minutes before taking a measurement.  Use the bathroom and empty your bladder (urinate).  Sit quietly for at least 5 minutes before taking measurements. Monitor your blood pressure at home as told by your health care provider. To do this:  Sit with your back straight and supported.  Place your feet  flat on the floor. Do not cross your legs.  Support your arm on a flat surface, such as a table. Make sure your upper arm is at heart level.  Each time you measure, take two or three readings one minute apart and record the results. You may also need to have your blood pressure checked regularly by your health care provider.   General information  Talk with your health care provider about your diet, exercise habits, and other lifestyle factors that may be contributing to hypertension.  Review all the medicines you take with your health care provider because there may be side effects or interactions.  Keep all visits as told by your health care provider. Your health care provider can help you create and adjust your plan for managing your high blood pressure. Where to find more information  National Heart, Lung, and Blood Institute: https://wilson-eaton.com/  American Heart Association: www.heart.org Contact a health care provider if:  You think you are having a reaction to medicines you have taken.  You have repeated (recurrent) headaches.  You feel dizzy.  You have swelling in your ankles.  You have trouble  with your vision. Get help right away if:  You develop a severe headache or confusion.  You have unusual weakness or numbness, or you feel faint.  You have severe pain in your chest or abdomen.  You vomit repeatedly.  You have trouble breathing. These symptoms may represent a serious problem that is an emergency. Do not wait to see if the symptoms will go away. Get medical help right away. Call your local emergency services (911 in the [place] Do not drive yourself to the hospital. Summary  Hypertension is when the force of blood pumping through your arteries is too strong. If this condition is not controlled, it may put you at risk for serious complications.  Your personal target blood pressure may vary depending on your medical conditions, your age, and other factors. For  most people, a normal blood pressure is less than 120/80.  Hypertension is managed by lifestyle changes, medicines, or both.  Lifestyle changes to help manage hypertension include losing weight, eating a healthy, low-sodium diet, exercising more, stopping smoking, and limiting alcohol. This information is not intended to replace advice given to you by your health care provider. Make sure you discuss any questions you have with your health care provider. Document Revised: 02/18/2019 Document Reviewed: 12/14/2018 Elsevier Patient Education  2021 [place].  Diabetes Care, 44(Suppl 1), 4505281704. https://doi.org/https://doi.org/10.2337/dc21-S003">  Prediabetes Prediabetes is when your blood sugar (blood glucose) level is higher than normal but not high enough for you to be diagnosed with type 2 diabetes. Having prediabetes puts you at risk for developing type 2 diabetes (type 2 diabetes mellitus). With certain lifestyle changes, you may be able to prevent or delay the onset of type 2 diabetes. This is important because type 2 diabetes can lead to serious complications, such as:  Heart disease.  Stroke.  Blindness.  Kidney disease.  Depression.  Poor circulation in the feet and legs. In severe cases, this could lead to surgical removal of a leg (amputation). What are the causes? The exact cause of prediabetes is not known. It may result from insulin resistance. Insulin resistance develops when cells in the body do not respond properly to insulin that the body makes. This can cause excess glucose to build up in the blood. High blood glucose (hyperglycemia) can develop. What increases the risk? The following factors may make you more likely to develop this condition:  You have a family member with type 2 diabetes.  You are older than 45 years.  You had a temporary form of diabetes during a pregnancy (gestational diabetes).  You had polycystic ovary syndrome (PCOS).  You are  overweight or obese.  You are inactive (sedentary).  You have a history of heart disease, including problems with cholesterol levels, high levels of blood fats, or high blood pressure. What are the signs or symptoms? You may have no symptoms. If you do have symptoms, they may include:  Increased hunger.  Increased thirst.  Increased urination.  Vision changes, such as blurry vision.  Tiredness (fatigue). How is this diagnosed? This condition can be diagnosed with blood tests. Your blood glucose may be checked with one or more of the following tests:  A fasting blood glucose (FBG) test. You will not be allowed to eat (you will fast) for at least 8 hours before a blood sample is taken.  An A1C blood test (hemoglobin A1C). This test provides information about blood glucose levels over the previous 2?3 months.  An oral glucose tolerance test (OGTT). This test  measures your blood glucose at two points in time: ? After fasting. This is your baseline level. ? Two hours after you drink a beverage that contains glucose. You may be diagnosed with prediabetes if:  Your FBG is 100?125 mg/dL (5.6-6.9 mmol/L).  Your A1C level is 5.7?6.4% (39-46 mmol/mol).  Your OGTT result is 140?199 mg/dL (7.8-11 mmol/L). These blood tests may be repeated to confirm your diagnosis.   How is this treated? Treatment may include dietary and lifestyle changes to help lower your blood glucose and prevent type 2 diabetes from developing. In some cases, medicine may be prescribed to help lower the risk of type 2 diabetes. Follow these instructions at home: Nutrition  Follow a healthy meal plan. This includes eating lean proteins, whole grains, legumes, fresh fruits and vegetables, low-fat dairy products, and healthy fats.  Follow instructions from your health care provider about eating or drinking restrictions.  Meet with a dietitian to create a healthy eating plan that is right for you.   Lifestyle  Do  moderate-intensity exercise for at least 30 minutes a day on 5 or more days each week, or as told by your health care provider. A mix of activities may be best, such as: ? Brisk walking, swimming, biking, and weight lifting.  Lose weight as told by your health care provider. Losing 5-7% of your body weight can reverse insulin resistance.  Do not drink alcohol if: ? Your health care provider tells you not to drink. ? You are pregnant, may be pregnant, or are planning to become pregnant.  If you drink alcohol: ? Limit how much you use to:  0-1 drink a day for women.  0-2 drinks a day for men. ? Be aware of how much alcohol is in your drink. In the [place] one drink equals one 12 oz bottle of beer (355 mL), one 5 oz glass of wine (148 mL), or one 1 oz glass of hard liquor (44 mL). General instructions  Take over-the-counter and prescription medicines only as told by your health care provider. You may be prescribed medicines that help lower the risk of type 2 diabetes.  Do not use any products that contain nicotine or tobacco, such as cigarettes, e-cigarettes, and chewing tobacco. If you need help quitting, ask your health care provider.  Keep all follow-up visits. This is important. Where to find more information  American Diabetes Association: www.diabetes.org  Academy of Nutrition and Dietetics: www.eatright.org  American Heart Association: www.heart.org Contact a health care provider if:  You have any of these symptoms: ? Increased hunger. ? Increased urination. ? Increased thirst. ? Fatigue. ? Vision changes, such as blurry vision. Get help right away if you:  Have shortness of breath.  Feel confused.  Vomit or feel like you may vomit. Summary  Prediabetes is when your blood sugar (blood glucose)level is higher than normal but not high enough for you to be diagnosed with type 2 diabetes.  Having prediabetes puts you at risk for developing type 2 diabetes (type 2  diabetes mellitus).  Make lifestyle changes such as eating a healthy diet and exercising regularly to help prevent diabetes. Lose weight as told by your health care provider. This information is not intended to replace advice given to you by your health care provider. Make sure you discuss any questions you have with your health care provider. Document Revised: 04/14/2019 Document Reviewed: 04/14/2019 Elsevier Patient Education  2021 [employer].    If you have lab work done today you  will be contacted with your lab results within the next 2 weeks.  If you have not heard from [place] then please contact us. The fastest way to get your results is to register for My Chart.   IF you received an x-ray today, you will receive an invoice from [facility]. Please contact [facility] at [phone] with questions or concerns regarding your invoice.   IF you received labwork today, you will receive an invoice from [facility]. Please contact [facility] at [phone] with questions or concerns regarding your invoice.   Our billing staff will not be able to assist you with questions regarding bills from these companies.  You will be contacted with the lab results as soon as they are available. The fastest way to get your results is to activate your My Chart account. Instructions are located on the last page of this paperwork. If you have not heard from [place] regarding the results in 2 weeks, please contact this office.

## 2020-03-10 ENCOUNTER — Encounter: Payer: Self-pay | Admitting: Family Medicine

## 2020-03-10 LAB — LIPID PANEL
Chol/HDL Ratio: 3.1 ratio (ref 0.0–5.0)
Cholesterol, Total: 185 mg/dL (ref 100–199)
HDL: 59 mg/dL (ref >39)
LDL Chol Calc (NIH): 106 mg/dL — ABNORMAL HIGH (ref 0–99)
Triglycerides: 113 mg/dL (ref 0–149)
VLDL Cholesterol Cal: 20 mg/dL (ref 5–40)

## 2020-03-10 LAB — COMPREHENSIVE METABOLIC PANEL
ALT: 24 IU/L (ref 0–44)
AST: 24 IU/L (ref 0–40)
Albumin/Globulin Ratio: 1.7 (ref 1.2–2.2)
Albumin: 4.5 g/dL (ref 3.8–4.8)
Alkaline Phosphatase: 50 IU/L (ref 44–121)
BUN/Creatinine Ratio: 18 (ref 10–24)
BUN: 16 mg/dL (ref 8–27)
Bilirubin Total: 0.4 mg/dL (ref 0.0–1.2)
CO2: 22 mmol/L (ref 20–29)
Calcium: 9.3 mg/dL (ref 8.6–10.2)
Chloride: 94 mmol/L — ABNORMAL LOW (ref 96–106)
Creatinine, Ser: 0.89 mg/dL (ref 0.76–1.27)
GFR calc Af Amer: 104 mL/min/{1.73_m2} (ref >59)
GFR calc non Af Amer: 90 mL/min/{1.73_m2} (ref >59)
Globulin, Total: 2.6 g/dL (ref 1.5–4.5)
Glucose: 95 mg/dL (ref 65–99)
Potassium: 4 mmol/L (ref 3.5–5.2)
Sodium: 133 mmol/L — ABNORMAL LOW (ref 134–144)
Total Protein: 7.1 g/dL (ref 6.0–8.5)

## 2020-03-10 LAB — HEMOGLOBIN A1C
Est. average glucose Bld gHb Est-mCnc: 131 mg/dL
Hgb A1c MFr Bld: 6.2 % — ABNORMAL HIGH (ref 4.8–5.6)

## 2020-03-26 ENCOUNTER — Other Ambulatory Visit: Payer: Self-pay | Admitting: Family Medicine

## 2020-03-26 DIAGNOSIS — I10 Essential (primary) hypertension: Secondary | ICD-10-CM

## 2020-06-11 ENCOUNTER — Ambulatory Visit (INDEPENDENT_AMBULATORY_CARE_PROVIDER_SITE_OTHER): Payer: Medicare Other | Admitting: Physician Assistant

## 2020-06-11 ENCOUNTER — Other Ambulatory Visit: Payer: Self-pay

## 2020-06-11 ENCOUNTER — Encounter: Payer: Self-pay | Admitting: Physician Assistant

## 2020-06-11 VITALS — BP 127/66 | HR 59 | Temp 97.8°F | Ht 66.0 in | Wt 168.9 lb

## 2020-06-11 DIAGNOSIS — R7303 Prediabetes: Secondary | ICD-10-CM | POA: Diagnosis not present

## 2020-06-11 DIAGNOSIS — E785 Hyperlipidemia, unspecified: Secondary | ICD-10-CM

## 2020-06-11 DIAGNOSIS — Z7689 Persons encountering health services in other specified circumstances: Secondary | ICD-10-CM

## 2020-06-11 DIAGNOSIS — I10 Essential (primary) hypertension: Secondary | ICD-10-CM

## 2020-06-11 DIAGNOSIS — F411 Generalized anxiety disorder: Secondary | ICD-10-CM

## 2020-06-11 NOTE — Progress Notes (Signed)
New Patient Office Visit  Subjective:  Patient ID: Samuel Macias, male    DOB: [birth year]  Age: 65 y.o. MRN: [id number]  CC:  Chief Complaint  Patient presents with  . New Patient (Initial Visit)    HPI DIMITRIOUS MICCICHE presents to establish care.  Patient has a past medical history of hyperlipidemia, hypertension, GERD, anxiety, ED and prediabetes.  Currently takes amlodipine 10 mg, hydrochlorothiazide 12.5 mg, losartan 100 mg and metoprolol succinate 50 mg which reports is working well to control blood pressure.  Taking paroxetine 10 mg for anxiety which has significantly improved his symptoms. Takes Crestor 20 mg for hyperlipidemia.  Reports is managing prediabetes with diet and staying active.  Family history is pertinent for hyperlipidemia, COPD and Crohn's disease.  Patient reports seldom uses sildenafil.  Patient has never been a smoker reports has 1 beer per week.  Drinks about 2 cups of coffee per day.  Is retired but works part-time at [employer].  Past Medical History:  Diagnosis Date  . Hyperlipidemia   . Hypertension   . Personal history of colonic polyps - adenomas 11/05/2006    Past Surgical History:  Procedure Laterality Date  . EXTERNAL EAR SURGERY     as child  . pain block     Herniated disc repair  . WRIST SURGERY     tendon    Family History  Problem Relation Age of Onset  . Crohn's disease Brother   . Hyperlipidemia Brother   . COPD Mother   . Colon cancer Neg Hx     Social History   Socioeconomic History  . Marital status: Married    Spouse name: Gila Crossing  . Number of children: 2  . Years of education: 12th grade  . Highest education level: Not on file  Occupational History  . Occupation: Education officer, community for [employer]  . Smoking status: Former Smoker    Packs/day: 0.40    Years: 5.00    Pack years: 2.00    Types: Cigarettes    Quit date: 01/27/1981    Years since quitting: 39.4  . Smokeless tobacco: Former Systems developer    Types:  [place] date: 01/27/1981  Vaping Use  . Vaping Use: Never used  Substance and Sexual Activity  . Alcohol use: Yes    Alcohol/week: 1.0 standard drink    Types: 1 Standard drinks or equivalent per week  . Drug use: No  . Sexual activity: Yes  Other Topics Concern  . Not on file  Social History Narrative   Lives with his wife and son, [name]. His daughter lives independently in [place], [place].   Education: [employer]   Exercise: Yes   Social Determinants of Health   Financial Resource Strain: Not on file  Food Insecurity: Not on file  Transportation Needs: Not on file  Physical Activity: Not on file  Stress: Not on file  Social Connections: Not on file  Intimate Partner Violence: Not on file    ROS Review of Systems A fourteen system review of systems was performed and found to be positive as per HPI.  Objective:   Today's Vitals: BP 127/66   Pulse (!) 59   Temp 97.8 F (36.6 C)   Ht 5\' 6"  (1.676 m)   Wt 168 lb 14.4 oz (76.6 kg)   SpO2 98%   BMI 27.26 kg/m   Physical Exam General:  Well Developed, well nourished, appropriate for stated age.  Neuro:  Alert and oriented,  extra-ocular muscles intact  HEENT:  Normocephalic, atraumatic, neck supple Skin:  no gross rash, warm, pink. Cardiac:  RRR, S1 S2, no murmur Respiratory:  ECTA B/L, Not using accessory muscles, speaking in full sentences- unlabored. Vascular:  Ext warm, no cyanosis apprec.; cap RF less 2 sec. Psych:  No HI/SI, judgement and insight good, Euthymic mood. Full Affect.  Assessment & Plan:   Problem List Items Addressed This Visit      Other   Hyperlipidemia   Prediabetes    Other Visit Diagnoses    Encounter to establish care    -  Primary   Essential hypertension       Anxiety state         Essential hypertension: -Controlled. -Continue current medication regimen. -Will continue to monitor.  Hyperlipidemia: -Reviewed last lipid panel 03/09/20: Total cholesterol 185, triglycerides  113, HDL 59, LDL 106 -Continue current medication regimen. -Recommend to follow a heart healthy diet. -We will continue to monitor and repeat lipid panel and hepatic function with [facility].  Prediabetes: -Reviewed last A1c 03/09/20, stable at 6.2. -Recommend to follow a low carbohydrate and glucose diet. -Encourage to establish routine exercise regimen such as brisk walking. -Will continue to monitor.  Anxiety state: -Stable. PHQ-9 score of 0. -Continue current medication regimen. -Will continue to monitor.   Outpatient Encounter Medications as of 06/11/2020  Medication Sig  . amLODipine (NORVASC) 10 MG tablet Take 1 tablet (10 mg total) by mouth daily.  Marland Kitchen aspirin 81 MG tablet Take 81 mg by mouth daily.  . Coenzyme Q10 (COQ-10) 30 MG CAPS Take 20 mLs by mouth.  . hydrochlorothiazide (MICROZIDE) 12.5 MG capsule Take 1 capsule (12.5 mg total) by mouth daily.  Marland Kitchen losartan (COZAAR) 100 MG tablet Take 1 tablet (100 mg total) by mouth daily.  . metoprolol succinate (TOPROL-XL) 50 MG 24 hr tablet TAKE 1 TABLET BY MOUTH  DAILY WITH OR IMMEDIATLEY  FOLLOWING A MEAL  . Omega-3 Fatty Acids (FISH OIL) 1200 MG CAPS Take 1 capsule by mouth daily.  Marland Kitchen PARoxetine (PAXIL) 10 MG tablet Take 1 tablet (10 mg total) by mouth daily.  . rosuvastatin (CRESTOR) 20 MG tablet Take 1 tablet (20 mg total) by mouth daily.  . sildenafil (REVATIO) 20 MG tablet Take 1-3 tablets (20-60 mg total) by mouth daily as needed.   No facility-administered encounter medications on file as of 06/11/2020.    Follow-up: Return in about 4 months (around 10/12/2020) for Welcome to Medicare and FBW 1 wk prior .   Note:  This note was prepared with assistance of Dragon voice recognition software. Occasional wrong-word or sound-a-like substitutions may have occurred due to the inherent limitations of voice recognition software.  Lorrene Reid, PA-C

## 2020-06-11 NOTE — Patient Instructions (Signed)
Prediabetes Eating Plan Prediabetes is a condition that causes blood sugar (glucose) levels to be higher than normal. This increases the risk for developing type 2 diabetes (type 2 diabetes mellitus). Working with a health care provider or nutrition specialist (dietitian) to make diet and lifestyle changes can help prevent the onset of diabetes. These changes may help you:  Control your blood glucose levels.  Improve your cholesterol levels.  Manage your blood pressure. What are tips for following this plan? Reading food labels  Read food labels to check the amount of fat, salt (sodium), and sugar in prepackaged foods. Avoid foods that have: ? Saturated fats. ? Trans fats. ? Added sugars.  Avoid foods that have more than 300 milligrams (mg) of sodium per serving. Limit your sodium intake to less than 2,300 mg each day. Shopping  Avoid buying pre-made and processed foods.  Avoid buying drinks with added sugar. Cooking  Cook with olive oil. Do not use butter, lard, or ghee.  Bake, broil, grill, steam, or boil foods. Avoid frying. Meal planning  Work with your dietitian to create an eating plan that is right for you. This may include tracking how many calories you take in each day. Use a food diary, notebook, or mobile application to track what you eat at each meal.  Consider following a Mediterranean diet. This includes: ? Eating several servings of fresh fruits and vegetables each day. ? Eating fish at least twice a week. ? Eating one serving each day of whole grains, beans, nuts, and seeds. ? Using olive oil instead of other fats. ? Limiting alcohol. ? Limiting red meat. ? Using nonfat or low-fat dairy products.  Consider following a plant-based diet. This includes dietary choices that focus on eating mostly vegetables and fruit, grains, beans, nuts, and seeds.  If you have high blood pressure, you may need to limit your sodium intake or follow a diet such as the DASH  (Dietary Approaches to Stop Hypertension) eating plan. The DASH diet aims to lower high blood pressure.   Lifestyle  Set weight loss goals with help from your health care team. It is recommended that most people with prediabetes lose 7% of their body weight.  Exercise for at least 30 minutes 5 or more days a week.  Attend a support group or seek support from a mental health counselor.  Take over-the-counter and prescription medicines only as told by your health care provider. What foods are recommended? Fruits Berries. Bananas. Apples. Oranges. Grapes. Papaya. Mango. Pomegranate. Kiwi. Grapefruit. Cherries. Vegetables Lettuce. Spinach. Peas. Beets. Cauliflower. Cabbage. Broccoli. Carrots. Tomatoes. Squash. Eggplant. Herbs. Peppers. Onions. Cucumbers. Brussels sprouts. Grains Whole grains, such as whole-wheat or whole-grain breads, crackers, cereals, and pasta. Unsweetened oatmeal. Bulgur. Barley. Quinoa. Brown rice. Corn or whole-wheat flour tortillas or taco shells. Meats and other proteins Seafood. Poultry without skin. Lean cuts of pork and beef. Tofu. Eggs. Nuts. Beans. Dairy Low-fat or fat-free dairy products, such as yogurt, cottage cheese, and cheese. Beverages Water. Tea. Coffee. Sugar-free or diet soda. Seltzer water. Low-fat or nonfat milk. Milk alternatives, such as soy or almond milk. Fats and oils Olive oil. Canola oil. Sunflower oil. Grapeseed oil. Avocado. Walnuts. Sweets and desserts Sugar-free or low-fat pudding. Sugar-free or low-fat ice cream and other frozen treats. Seasonings and condiments Herbs. Sodium-free spices. Mustard. Relish. Low-salt, low-sugar ketchup. Low-salt, low-sugar barbecue sauce. Low-fat or fat-free mayonnaise. The items listed above may not be a complete list of recommended foods and beverages. Contact a dietitian for more   information. What foods are not recommended? Fruits Fruits canned with syrup. Vegetables Canned vegetables. Frozen  vegetables with butter or cream sauce. Grains Refined white flour and flour products, such as bread, pasta, snack foods, and cereals. Meats and other proteins Fatty cuts of meat. Poultry with skin. Breaded or fried meat. Processed meats. Dairy Full-fat yogurt, cheese, or milk. Beverages Sweetened drinks, such as iced tea and soda. Fats and oils Butter. Lard. Ghee. Sweets and desserts Baked goods, such as cake, cupcakes, pastries, cookies, and cheesecake. Seasonings and condiments Spice mixes with added salt. Ketchup. Barbecue sauce. Mayonnaise. The items listed above may not be a complete list of foods and beverages that are not recommended. Contact a dietitian for more information. Where to find more information  American Diabetes Association: www.diabetes.org Summary  You may need to make diet and lifestyle changes to help prevent the onset of diabetes. These changes can help you control blood sugar, improve cholesterol levels, and manage blood pressure.  Set weight loss goals with help from your health care team. It is recommended that most people with prediabetes lose 7% of their body weight.  Consider following a Mediterranean diet. This includes eating plenty of fresh fruits and vegetables, whole grains, beans, nuts, seeds, fish, and low-fat dairy, and using olive oil instead of other fats. This information is not intended to replace advice given to you by your health care provider. Make sure you discuss any questions you have with your health care provider. Document Revised: 04/14/2019 Document Reviewed: 04/14/2019 Elsevier Patient Education  2021 Elsevier Inc.  

## 2020-09-12 ENCOUNTER — Encounter: Payer: Self-pay | Admitting: Family Medicine

## 2020-09-12 ENCOUNTER — Encounter: Payer: Self-pay | Admitting: Physician Assistant

## 2020-10-22 ENCOUNTER — Other Ambulatory Visit: Payer: Self-pay | Admitting: Family Medicine

## 2020-10-22 DIAGNOSIS — I10 Essential (primary) hypertension: Secondary | ICD-10-CM

## 2020-11-14 ENCOUNTER — Other Ambulatory Visit: Payer: Self-pay

## 2020-11-14 ENCOUNTER — Encounter: Payer: Self-pay | Admitting: Physician Assistant

## 2020-11-14 ENCOUNTER — Ambulatory Visit (INDEPENDENT_AMBULATORY_CARE_PROVIDER_SITE_OTHER): Payer: Medicare Other | Admitting: Physician Assistant

## 2020-11-14 VITALS — BP 122/71 | HR 52 | Temp 98.7°F | Ht 66.0 in | Wt 162.0 lb

## 2020-11-14 DIAGNOSIS — I10 Essential (primary) hypertension: Secondary | ICD-10-CM | POA: Diagnosis not present

## 2020-11-14 DIAGNOSIS — Z13228 Encounter for screening for other metabolic disorders: Secondary | ICD-10-CM

## 2020-11-14 DIAGNOSIS — Z1211 Encounter for screening for malignant neoplasm of colon: Secondary | ICD-10-CM

## 2020-11-14 DIAGNOSIS — Z1329 Encounter for screening for other suspected endocrine disorder: Secondary | ICD-10-CM | POA: Diagnosis not present

## 2020-11-14 DIAGNOSIS — Z0000 Encounter for general adult medical examination without abnormal findings: Secondary | ICD-10-CM | POA: Diagnosis not present

## 2020-11-14 DIAGNOSIS — Z1321 Encounter for screening for nutritional disorder: Secondary | ICD-10-CM

## 2020-11-14 DIAGNOSIS — Z130 Encounter for screening for diseases of the blood and blood-forming organs and certain disorders involving the immune mechanism: Secondary | ICD-10-CM

## 2020-11-14 DIAGNOSIS — D369 Benign neoplasm, unspecified site: Secondary | ICD-10-CM

## 2020-11-14 DIAGNOSIS — R7303 Prediabetes: Secondary | ICD-10-CM

## 2020-11-14 DIAGNOSIS — E785 Hyperlipidemia, unspecified: Secondary | ICD-10-CM | POA: Diagnosis not present

## 2020-11-14 NOTE — Patient Instructions (Signed)
Preventive Care 65 Years and Older, Male Preventive care refers to lifestyle choices and visits with your health care provider that can promote health and wellness. This includes: A yearly physical exam. This is also called an annual wellness visit. Regular dental and eye exams. Immunizations. Screening for certain conditions. Healthy lifestyle choices, such as: Eating a healthy diet. Getting regular exercise. Not using drugs or products that contain nicotine and tobacco. Limiting alcohol use. What can I expect for my preventive care visit? Physical exam Your health care provider will check your: Height and weight. These may be used to calculate your BMI (body mass index). BMI is a measurement that tells if you are at a healthy weight. Heart rate and blood pressure. Body temperature. Skin for abnormal spots. Counseling Your health care provider may ask you questions about your: Past medical problems. Family's medical history. Alcohol, tobacco, and drug use. Emotional well-being. Home life and relationship well-being. Sexual activity. Diet, exercise, and sleep habits. History of falls. Memory and ability to understand (cognition). Work and work Statistician. Access to firearms. What immunizations do I need? Vaccines are usually given at various ages, according to a schedule. Your health care provider will recommend vaccines for you based on your age, medical history, and lifestyle or other factors, such as travel or where you work. What tests do I need? Blood tests Lipid and cholesterol levels. These may be checked every 5 years, or more often depending on your overall health. Hepatitis C test. Hepatitis B test. Screening Lung cancer screening. You may have this screening every year starting at age 65 if you have a 30-pack-year history of smoking and currently smoke or have quit within the past 15 years. Colorectal cancer screening. All adults should have this screening  starting at age 65 and continuing until age 65. Your health care provider may recommend screening at age 65 if you are at increased risk. You will have tests every 1-10 years, depending on your results and the type of screening test. Prostate cancer screening. Recommendations will vary depending on your family history and other risks. Genital exam to check for testicular cancer or hernias. Diabetes screening. This is done by checking your blood sugar (glucose) after you have not eaten for a while (fasting). You may have this done every 1-3 years. Abdominal aortic aneurysm (AAA) screening. You may need this if you are a current or former smoker. STD (sexually transmitted disease) testing, if you are at risk. Follow these instructions at home: Eating and drinking  Eat a diet that includes fresh fruits and vegetables, whole grains, lean protein, and low-fat dairy products. Limit your intake of foods with high amounts of sugar, saturated fats, and salt. Take vitamin and mineral supplements as recommended by your health care provider. Do not drink alcohol if your health care provider tells you not to drink. If you drink alcohol: Limit how much you have to 0-2 drinks a day. Be aware of how much alcohol is in your drink. In the [place] one drink equals one 12 oz bottle of beer (355 mL), one 5 oz glass of wine (148 mL), or one 1 oz glass of hard liquor (44 mL). Lifestyle Take daily care of your teeth and gums. Brush your teeth every morning and night with fluoride toothpaste. Floss one time each day. Stay active. Exercise for at least 30 minutes 5 or more days each week. Do not use any products that contain nicotine or tobacco, such as cigarettes, e-cigarettes, and chewing tobacco. If  you need help quitting, ask your health care provider. Do not use drugs. If you are sexually active, practice safe sex. Use a condom or other form of protection to prevent STIs (sexually transmitted infections). Talk  with your health care provider about taking a low-dose aspirin or statin. Find healthy ways to cope with stress, such as: Meditation, yoga, or listening to music. Journaling. Talking to a trusted person. Spending time with friends and family. Safety Always wear your seat belt while driving or riding in a vehicle. Do not drive: If you have been drinking alcohol. Do not ride with someone who has been drinking. When you are tired or distracted. While texting. Wear a helmet and other protective equipment during sports activities. If you have firearms in your house, make sure you follow all gun safety procedures. What's next? Visit your health care provider once a year for an annual wellness visit. Ask your health care provider how often you should have your eyes and teeth checked. Stay up to date on all vaccines. This information is not intended to replace advice given to you by your health care provider. Make sure you discuss any questions you have with your health care provider. Document Revised: 03/23/2020 Document Reviewed: 01/07/2018 Elsevier Patient Education  2022 [employer].

## 2020-11-14 NOTE — Progress Notes (Signed)
Male physical   Impression and Recommendations:    1. Healthcare maintenance   2. Screening for endocrine, nutritional, metabolic and immunity disorder   3. Hypertension, unspecified type   4. Hyperlipidemia, unspecified hyperlipidemia type   5. Prediabetes   6. Screening for colon cancer   7. Adenomatous polyps      1) Anticipatory Guidance: Skin CA prevention- recommend to use sunscreen when outside along with skin surveillance; eat a balanced and modest diet; physical activity at least 25 minutes per day or minimum of 150 min/ week moderate to intense activity.  2) Immunizations / Screenings / Labs:   All immunizations are up-to-date per recommendations or will be updated today if pt allows.    - Patient understands with dental and vision screens they will schedule independently.  - Will obtain CBC, CMP, HgA1c, Lipid panel, TSH  when fasting, if not already done past 12 mo/ recently - Will place referral for repeat colonoscopy. -Pt reports obtained influenza vaccine at [facility] pharmacy, will request records.  3) Weight: Recommend to improve diet habits to improve overall feelings of well being and objective health data. Improve nutrient density of diet through increasing intake of fruits and vegetables and decreasing saturated fats, white flour products and refined sugars.   4) Healthcare Maintenance: -Stay well hydrated. -Continue current medication regimen. -BP wnl's, pulse stable.  -Recommend to schedule visit to further discuss chronic cough.   Orders Placed This Encounter  Procedures   CBC with Differential/Platelet    Standing Status:   Future    Number of Occurrences:   1    Standing Expiration Date:   12/15/2020   Comprehensive metabolic panel    Standing Status:   Future    Number of Occurrences:   1    Standing Expiration Date:   12/15/2020    Order Specific Question:   Has the patient fasted?    Answer:   Yes   Hemoglobin A1c    Standing Status:    Future    Number of Occurrences:   1    Standing Expiration Date:   12/15/2020   Lipid panel    Standing Status:   Future    Number of Occurrences:   1    Standing Expiration Date:   12/15/2020    Order Specific Question:   Has the patient fasted?    Answer:   Yes   TSH    Standing Status:   Future    Number of Occurrences:   1    Standing Expiration Date:   12/15/2020   Ambulatory referral to Gastroenterology    Referral Priority:   Routine    Referral Type:   Consultation    Referral Reason:   Specialty Services Required    Number of Visits Requested:   1     No orders of the defined types were placed in this encounter.    Return for next available visit for chronic cough.     Gross side effects, risk and benefits, and alternatives of medications discussed with patient.  Patient is aware that all medications have potential side effects and we are unable to predict every side effect or drug-drug interaction that may occur.  Expresses verbal understanding and consents to current therapy plan and treatment regimen.  Please see AVS handed out to patient at the end of our visit for further patient instructions/ counseling done pertaining to today's office visit.       Subjective:  CC: CPE   HPI: Samuel Macias is a 65 y.o. male who presents to [place] [facility] today for a yearly health maintenance exam.     Health Maintenance Summary  - Reviewed and updated, unless pt declines services.  Last Cologuard or Colonoscopy:   08/03/2013- repeat in 7 years, adenoma+ Family history of Colon CA:  no  Tobacco History Reviewed:   yes, former smoker with 5 pack yr hx  Alcohol / drug use:    No concerns, no excessive use / no use Dental Home:  y Eye exams: y Male history: STD concerns:   none, monogamous Additional penile/ urinary concerns:   none   Additional concerns beyond Health Maintenance issues:   chronic  cough    Immunization History  Administered Date(s) Administered   Influenza Inj Mdck Quad Pf 10/12/2016, 10/25/2017   Influenza,inj,Quad PF,6+ Mos 10/23/2014, 09/23/2018   Influenza-Unspecified 11/13/2015, 10/12/2016, 10/25/2017, 10/27/2019   Janssen (J&J) SARS-COV-2 Vaccination 05/04/2019   Tdap 09/30/2006, 11/25/2016     Health Maintenance  Topic Date Due   Zoster Vaccines- Shingrix (1 of 2) Never done   COVID-19 Vaccine (2 - Booster for Janssen series) 06/29/2019   Pneumonia Vaccine 74+ Years old (1 - PCV) Never done   COLONOSCOPY (Pts 45-42yrs Insurance coverage will need to be confirmed)  08/03/2020   INFLUENZA VACCINE  08/27/2020   TETANUS/TDAP  11/26/2026   Hepatitis C Screening  Completed   HIV Screening  Completed   HPV VACCINES  Aged Out       Wt Readings from Last 3 Encounters:  11/14/20 162 lb (73.5 kg)  06/11/20 168 lb 14.4 oz (76.6 kg)  03/09/20 169 lb (76.7 kg)   BP Readings from Last 3 Encounters:  11/14/20 122/71  06/11/20 127/66  03/09/20 (!) 144/77   Pulse Readings from Last 3 Encounters:  11/14/20 (!) 52  06/11/20 (!) 59  03/09/20 60    Patient Active Problem List   Diagnosis Date Noted   Prediabetes 08/17/2018   Symptomatic bradycardia 04/25/2016   Dizziness 03/17/2016   Cough 04/26/2015   GERD (gastroesophageal reflux disease) 04/26/2015   Laryngopharyngeal reflux (LPR) 04/26/2015   Hypertension 03/01/2011   Hyperlipidemia 03/01/2011   Personal history of colonic polyps - adenomas 11/05/2006    Past Medical History:  Diagnosis Date   Hyperlipidemia    Hypertension    Personal history of colonic polyps - adenomas 11/05/2006    Past Surgical History:  Procedure Laterality Date   EXTERNAL EAR SURGERY     as child   pain block     Herniated disc repair   WRIST SURGERY     tendon    Family History  Problem Relation Age of Onset   Crohn's disease Brother    Hyperlipidemia Brother    COPD Mother    Colon cancer Neg Hx      Social History   Substance and Sexual Activity  Drug Use No  ,  Social History   Substance and Sexual Activity  Alcohol Use Yes   Alcohol/week: 1.0 standard drink   Types: 1 Standard drinks or equivalent per week  ,  Social History   Tobacco Use  Smoking Status Former   Packs/day: 0.40   Years: 5.00   Pack years: 2.00   Types: Cigarettes   Quit date: 01/27/1981   Years since quitting: 39.8  Smokeless Tobacco Former   Types: Chew   Quit date: 01/27/1981  ,  Social History  Substance and Sexual Activity  Sexual Activity Yes    Patient's Medications  New Prescriptions   No medications on file  Previous Medications   AMLODIPINE (NORVASC) 10 MG TABLET    TAKE 1 TABLET BY MOUTH  DAILY   ASPIRIN 81 MG TABLET    Take 81 mg by mouth daily.   COENZYME Q10 (COQ-10) 30 MG CAPS    Take 20 mLs by mouth.   HYDROCHLOROTHIAZIDE (MICROZIDE) 12.5 MG CAPSULE    TAKE 1 CAPSULE BY MOUTH  DAILY   LOSARTAN (COZAAR) 100 MG TABLET    Take 1 tablet (100 mg total) by mouth daily.   METOPROLOL SUCCINATE (TOPROL-XL) 50 MG 24 HR TABLET    TAKE 1 TABLET BY MOUTH  DAILY WITH OR IMMEDIATLEY  FOLLOWING A MEAL   OMEGA-3 FATTY ACIDS (FISH OIL) 1200 MG CAPS    Take 1 capsule by mouth daily.   PAROXETINE (PAXIL) 10 MG TABLET    Take 1 tablet (10 mg total) by mouth daily.   ROSUVASTATIN (CRESTOR) 20 MG TABLET    Take 1 tablet (20 mg total) by mouth daily.   SILDENAFIL (REVATIO) 20 MG TABLET    Take 1-3 tablets (20-60 mg total) by mouth daily as needed.  Modified Medications   No medications on file  Discontinued Medications   No medications on file    Patient has no known allergies.  Review of Systems: General:   Denies fever, chills, unexplained weight loss.  Optho/Auditory:   Denies visual changes, blurred vision/LOV Respiratory:   Denies SOB, DOE more than baseline levels, +cough Cardiovascular:   Denies chest pain, palpitations, new onset peripheral edema  Gastrointestinal:   Denies nausea,  vomiting, diarrhea.  Genitourinary: Denies dysuria, freq/ urgency, flank pain  Endocrine:     Denies hot or cold intolerance, polyuria, polydipsia. Musculoskeletal:   Denies unexplained myalgias, joint swelling, gait problems.  Skin:  Denies rash, suspicious lesions Neurological:     Denies dizziness, unexplained weakness, numbness  Psychiatric/Behavioral:   Denies mood changes, suicidal or homicidal ideations, hallucinations    Objective:     Blood pressure 122/71, pulse (!) 52, temperature 98.7 F (37.1 C), height 5\' 6"  (1.676 m), weight 162 lb (73.5 kg), SpO2 99 %. Body mass index is 26.15 kg/m. 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)  Nose:   Nares normal, septum midline, mucosa normal, no drainage    or sinus tenderness  Throat:   Lips w/o lesion, mucosa moist, and tongue normal; teeth and gums normal  Neck:   Supple, symmetrical, trachea midline, no adenopathy;    thyroid:  no enlargement/tenderness/nodules; no carotid   bruit or JVD  Back:     Symmetric, no curvature, ROM normal, no CVA tenderness  Lungs:     Clear to auscultation bilaterally, respirations unlabored, no Wh/ R/ R  Chest Wall:    No tenderness or gross deformity; normal excursion   Heart:    Regular rhythm, bradycardia, S1 and S2 normal, no murmur, rub   or gallop  Abdomen:     Soft, non-tender, bowel sounds active all four quadrants, No G/R/R, no masses, no organomegaly  Genitalia:    Deferred.  Rectal:  Deferred.  Extremities:   Extremities normal, atraumatic, no cyanosis or gross edema  Pulses:   2+ and symmetric all extremities  Skin:   Warm, dry, Skin color, texture, turgor normal, no obvious rashes or lesions  M-Sk:    Ambulates * 4 w/o difficulty, no gross deformities, tone WNL  Neurologic:   CNII-XII grossly intact Psych:  No HI/SI, judgement and insight good, Euthymic mood. Full Affect.

## 2020-11-15 LAB — CBC WITH DIFFERENTIAL/PLATELET
Basophils Absolute: 0.1 10*3/uL (ref 0.0–0.2)
Basos: 1 %
EOS (ABSOLUTE): 0.2 10*3/uL (ref 0.0–0.4)
Eos: 2 %
Hematocrit: 47.2 % (ref 37.5–51.0)
Hemoglobin: 16 g/dL (ref 13.0–17.7)
Immature Grans (Abs): 0 10*3/uL (ref 0.0–0.1)
Immature Granulocytes: 0 %
Lymphocytes Absolute: 2.3 10*3/uL (ref 0.7–3.1)
Lymphs: 28 %
MCH: 30.6 pg (ref 26.6–33.0)
MCHC: 33.9 g/dL (ref 31.5–35.7)
MCV: 90 fL (ref 79–97)
Monocytes Absolute: 0.8 10*3/uL (ref 0.1–0.9)
Monocytes: 10 %
Neutrophils Absolute: 4.7 10*3/uL (ref 1.4–7.0)
Neutrophils: 59 %
Platelets: 319 10*3/uL (ref 150–450)
RBC: 5.23 x10E6/uL (ref 4.14–5.80)
RDW: 11.9 % (ref 11.6–15.4)
WBC: 8.1 10*3/uL (ref 3.4–10.8)

## 2020-11-15 LAB — COMPREHENSIVE METABOLIC PANEL
ALT: 33 IU/L (ref 0–44)
AST: 27 IU/L (ref 0–40)
Albumin/Globulin Ratio: 1.9 (ref 1.2–2.2)
Albumin: 5 g/dL — ABNORMAL HIGH (ref 3.8–4.8)
Alkaline Phosphatase: 49 IU/L (ref 44–121)
BUN/Creatinine Ratio: 13 (ref 10–24)
BUN: 9 mg/dL (ref 8–27)
Bilirubin Total: 0.5 mg/dL (ref 0.0–1.2)
CO2: 25 mmol/L (ref 20–29)
Calcium: 9.9 mg/dL (ref 8.6–10.2)
Chloride: 94 mmol/L — ABNORMAL LOW (ref 96–106)
Creatinine, Ser: 0.71 mg/dL — ABNORMAL LOW (ref 0.76–1.27)
Globulin, Total: 2.6 g/dL (ref 1.5–4.5)
Glucose: 107 mg/dL — ABNORMAL HIGH (ref 70–99)
Potassium: 3.8 mmol/L (ref 3.5–5.2)
Sodium: 137 mmol/L (ref 134–144)
Total Protein: 7.6 g/dL (ref 6.0–8.5)
eGFR: 102 mL/min/{1.73_m2} (ref >59)

## 2020-11-15 LAB — LIPID PANEL
Chol/HDL Ratio: 3.2 ratio (ref 0.0–5.0)
Cholesterol, Total: 197 mg/dL (ref 100–199)
HDL: 61 mg/dL (ref >39)
LDL Chol Calc (NIH): 117 mg/dL — ABNORMAL HIGH (ref 0–99)
Triglycerides: 105 mg/dL (ref 0–149)
VLDL Cholesterol Cal: 19 mg/dL (ref 5–40)

## 2020-11-15 LAB — HEMOGLOBIN A1C
Est. average glucose Bld gHb Est-mCnc: 123 mg/dL
Hgb A1c MFr Bld: 5.9 % — ABNORMAL HIGH (ref 4.8–5.6)

## 2020-11-15 LAB — TSH: TSH: 1.88 u[IU]/mL (ref 0.450–4.500)

## 2020-12-02 ENCOUNTER — Encounter: Payer: Self-pay | Admitting: Internal Medicine

## 2020-12-05 ENCOUNTER — Encounter: Payer: Self-pay | Admitting: Physician Assistant

## 2020-12-05 ENCOUNTER — Ambulatory Visit (INDEPENDENT_AMBULATORY_CARE_PROVIDER_SITE_OTHER): Payer: Medicare Other | Admitting: Physician Assistant

## 2020-12-05 ENCOUNTER — Other Ambulatory Visit: Payer: Self-pay

## 2020-12-05 VITALS — BP 131/66 | HR 58 | Temp 98.5°F | Ht 66.0 in | Wt 169.5 lb

## 2020-12-05 DIAGNOSIS — I10 Essential (primary) hypertension: Secondary | ICD-10-CM

## 2020-12-05 DIAGNOSIS — R7303 Prediabetes: Secondary | ICD-10-CM

## 2020-12-05 DIAGNOSIS — R053 Chronic cough: Secondary | ICD-10-CM | POA: Diagnosis not present

## 2020-12-05 DIAGNOSIS — Z712 Person consulting for explanation of examination or test findings: Secondary | ICD-10-CM

## 2020-12-05 DIAGNOSIS — E785 Hyperlipidemia, unspecified: Secondary | ICD-10-CM

## 2020-12-05 NOTE — Progress Notes (Signed)
Established Patient Office Visit  Subjective:  Patient ID: Samuel Macias, male    DOB: 1955/07/10  Age: 65 y.o. MRN: [id number]  CC:  Chief Complaint  Patient presents with   Office Visit     Cough    HPI [facility] presents for discussion of intermittent cough x at least 2 years. Cough occurs few times per day. States clears his throat in the mornings and if working outside will tend to have to clear this throat again. Reports has been evaluated for COPD and it was negative. Denies indigestion or acid reflux, nausea, vomiting, hoarseness, runny nose or congestion.    Past Medical History:  Diagnosis Date   Hyperlipidemia    Hypertension    Personal history of colonic polyps - adenomas 11/05/2006    Past Surgical History:  Procedure Laterality Date   EXTERNAL EAR SURGERY     as child   pain block     Herniated disc repair   WRIST SURGERY     tendon    Family History  Problem Relation Age of Onset   Crohn's disease Brother    Hyperlipidemia Brother    COPD Mother    Colon cancer Neg Hx     Social History   Socioeconomic History   Marital status: Married    Spouse name: Sisters   Number of children: 2   Years of education: 12th grade   Highest education level: Not on file  Occupational History   Occupation: Education officer, community for [employer]   Smoking status: Former    Packs/day: 0.40    Years: 5.00    Pack years: 2.00    Types: Cigarettes    Quit date: 01/27/1981    Years since quitting: 39.8   Smokeless tobacco: Former    Types: Chew    Quit date: 01/27/1981  Vaping Use   Vaping Use: Never used  Substance and Sexual Activity   Alcohol use: Yes    Alcohol/week: 1.0 standard drink    Types: 1 Standard drinks or equivalent per week   Drug use: No   Sexual activity: Yes  Other Topics Concern   Not on file  Social History Narrative   Lives with his wife and son, Samuel Macias. His daughter lives independently in [place], [place].   Education:  [employer]   Exercise: Yes   Social Determinants of Health   Financial Resource Strain: Not on file  Food Insecurity: Not on file  Transportation Needs: Not on file  Physical Activity: Not on file  Stress: Not on file  Social Connections: Not on file  Intimate Partner Violence: Not on file    Outpatient Medications Prior to Visit  Medication Sig Dispense Refill   amLODipine (NORVASC) 10 MG tablet TAKE 1 TABLET BY MOUTH  DAILY 90 tablet 3   aspirin 81 MG tablet Take 81 mg by mouth daily.     Coenzyme Q10 (COQ-10) 30 MG CAPS Take 20 mLs by mouth.     hydrochlorothiazide (MICROZIDE) 12.5 MG capsule TAKE 1 CAPSULE BY MOUTH  DAILY 90 capsule 3   losartan (COZAAR) 100 MG tablet Take 1 tablet (100 mg total) by mouth daily. 90 tablet 2   metoprolol succinate (TOPROL-XL) 50 MG 24 hr tablet TAKE 1 TABLET BY MOUTH  DAILY WITH OR IMMEDIATLEY  FOLLOWING A MEAL 90 tablet 3   Omega-3 Fatty Acids (FISH OIL) 1200 MG CAPS Take 1 capsule by mouth daily.     PARoxetine (  PAXIL) 10 MG tablet Take 1 tablet (10 mg total) by mouth daily. 90 tablet 2   rosuvastatin (CRESTOR) 20 MG tablet Take 1 tablet (20 mg total) by mouth daily. 90 tablet 3   sildenafil (REVATIO) 20 MG tablet Take 1-3 tablets (20-60 mg total) by mouth daily as needed. 20 tablet 3   No facility-administered medications prior to visit.    No Known Allergies  ROS Review of Systems Review of Systems:  A fourteen system review of systems was performed and found to be positive as per HPI.   Objective:    Physical Exam General:  Well Developed, well nourished, appropriate for stated age.  Neuro:  Alert and oriented,  extra-ocular muscles intact  HEENT:  Normocephalic, atraumatic, neck supple Skin:  no gross rash, warm, pink. Cardiac:  RRR, S1 S2 Respiratory: CTA B/L, Not using accessory muscles, speaking in full sentences- unlabored. Vascular:  Ext warm, no cyanosis apprec.; cap RF less 2 sec. Psych:  No HI/SI, judgement and insight  good, Euthymic mood. Full Affect.  BP 131/66   Pulse (!) 58   Temp 98.5 F (36.9 C)   Ht '5\' 6"'  (1.676 m)   Wt 169 lb 8 oz (76.9 kg)   SpO2 98%   BMI 27.36 kg/m  Wt Readings from Last 3 Encounters:  12/05/20 169 lb 8 oz (76.9 kg)  11/14/20 162 lb (73.5 kg)  06/11/20 168 lb 14.4 oz (76.6 kg)     Health Maintenance Due  Topic Date Due   Zoster Vaccines- Shingrix (1 of 2) Never done   COVID-19 Vaccine (2 - Booster for Janssen series) 06/29/2019   Pneumonia Vaccine 26+ Years old (1 - PCV) Never done   COLONOSCOPY (Pts 45-50yr Insurance coverage will need to be confirmed)  08/03/2020   INFLUENZA VACCINE  08/27/2020    There are no preventive care reminders to display for this patient.  Lab Results  Component Value Date   TSH 1.880 11/14/2020   Lab Results  Component Value Date   WBC 8.1 11/14/2020   HGB 16.0 11/14/2020   HCT 47.2 11/14/2020   MCV 90 11/14/2020   PLT 319 11/14/2020   Lab Results  Component Value Date   NA 137 11/14/2020   K 3.8 11/14/2020   CO2 25 11/14/2020   GLUCOSE 107 (H) 11/14/2020   BUN 9 11/14/2020   CREATININE 0.71 (L) 11/14/2020   BILITOT 0.5 11/14/2020   ALKPHOS 49 11/14/2020   AST 27 11/14/2020   ALT 33 11/14/2020   PROT 7.6 11/14/2020   ALBUMIN 5.0 (H) 11/14/2020   CALCIUM 9.9 11/14/2020   ANIONGAP 15 05/13/2017   EGFR 102 11/14/2020   Lab Results  Component Value Date   CHOL 197 11/14/2020   Lab Results  Component Value Date   HDL 61 11/14/2020   Lab Results  Component Value Date   LDLCALC 117 (H) 11/14/2020   Lab Results  Component Value Date   TRIG 105 11/14/2020   Lab Results  Component Value Date   CHOLHDL 3.2 11/14/2020   Lab Results  Component Value Date   HGBA1C 5.9 (H) 11/14/2020      Assessment & Plan:   Problem List Items Addressed This Visit       Cardiovascular and Mediastinum   Hypertension - Primary     Other   Cough   Hypertension: -BP stable. Discussed with patient cough possibly  medication side effect and recommend to hold Losartan x 4 weeks, continue Metoprolol, HCTZ,  amlodipine.  -Advised to check blood pressure daily and keep a log to bring to follow up visit. -Will reassess BP and medication therapy in 4 weeks. -Discussed recent CMP, renal function stable, electrolytes normal.  Cough: -Discussed with patient potential etiologies and possibly medication side effect. Will hold Losartan x 4 weeks. Reviewed [facility] records, PFTs normal. If cough fails to improve or worsen recommend trial of PPI therapy and/or gabapentin.  -Will reassess symptoms in 4 weeks.    Hyperlipidemia: -Discussed recent lipid panel, LDL 117. -Recommend to continue rosuvastatin 20 mg. -Recommend repeating lipid panel in 3-4 months and if LDL remains elevated recommend increasing rosuvastatin to 40 mg. Patient verbalized understanding.   Prediabetes: -Discussed most recent A1c 5.9, improved from 6.2. -Recommend to follow low carbohydrate and glucose diet. -Will continue to monitor.   No orders of the defined types were placed in this encounter.   Follow-up: Return in about 4 weeks (around 01/02/2021) for HTN- stopped Losartan, cough.    Lorrene Reid, PA-C

## 2020-12-05 NOTE — Patient Instructions (Addendum)
Hold Losartan for 4 weeks. Check blood pressure daily at least 2 hours after taking blood pressure medications and keep a log to bring to follow up visit.   Cough, Adult A cough helps to clear your throat and lungs. A cough may be a sign of an illness or another medical condition. An acute cough may only last 2-3 weeks, while a chronic cough may last 8 or more weeks. Many things can cause a cough. They include: Germs (viruses or bacteria) that attack the airway. Breathing in things that bother (irritate) your lungs. Allergies. Asthma. Mucus that runs down the back of your throat (postnasal drip). Smoking. Acid backing up from the stomach into the tube that moves food from the mouth to the stomach (gastroesophageal reflux). Some medicines. Lung problems. Other medical conditions, such as heart failure or a blood clot in the lung (pulmonary embolism). Follow these instructions at home: Medicines Take over-the-counter and prescription medicines only as told by your doctor. Talk with your doctor before you take medicines that stop a cough (cough suppressants). Lifestyle  Do not smoke, and try not to be around smoke. Do not use any products that contain nicotine or tobacco, such as cigarettes, e-cigarettes, and chewing tobacco. If you need help quitting, ask your doctor. Drink enough fluid to keep your pee (urine) pale yellow. Avoid caffeine. Do not drink alcohol if your doctor tells you not to drink. General instructions  Watch for any changes in your cough. Tell your doctor about them. Always cover your mouth when you cough. Stay away from things that make you cough, such as perfume, candles, campfire smoke, or cleaning products. If the air is dry, use a cool mist vaporizer or humidifier in your home. If your cough is worse at night, try using extra pillows to raise your head up higher while you sleep. Rest as needed. Keep all follow-up visits as told by your doctor. This is  important. Contact a doctor if: You have new symptoms. You cough up pus. Your cough does not get better after 2-3 weeks, or your cough gets worse. Cough medicine does not help your cough and you are not sleeping well. You have pain that gets worse or pain that is not helped with medicine. You have a fever. You are losing weight and you do not know why. You have night sweats. Get help right away if: You cough up blood. You have trouble breathing. Your heartbeat is very fast. These symptoms may be an emergency. Do not wait to see if the symptoms will go away. Get medical help right away. Call your local emergency services (911 in the [place] Do not drive yourself to the hospital. Summary A cough helps to clear your throat and lungs. Many things can cause a cough. Take over-the-counter and prescription medicines only as told by your doctor. Always cover your mouth when you cough. Contact a doctor if you have new symptoms or you have a cough that does not get better or gets worse. This information is not intended to replace advice given to you by your health care provider. Make sure you discuss any questions you have with your health care provider. Document Revised: 03/04/2019 Document Reviewed: 02/01/2018 Elsevier Patient Education  [place].

## 2020-12-31 ENCOUNTER — Telehealth: Payer: Self-pay | Admitting: Physician Assistant

## 2020-12-31 ENCOUNTER — Other Ambulatory Visit: Payer: Self-pay

## 2020-12-31 DIAGNOSIS — F411 Generalized anxiety disorder: Secondary | ICD-10-CM

## 2020-12-31 MED ORDER — PAROXETINE HCL 10 MG PO TABS
10.0000 mg | ORAL_TABLET | Freq: Every day | ORAL | 2 refills | Status: DC
Start: 2020-12-31 — End: 2021-08-05

## 2020-12-31 NOTE — Telephone Encounter (Signed)
Patient called and requested a refill on his Paxil for a 90 day supply at [facility]. Please advise. [phone]

## 2020-12-31 NOTE — Telephone Encounter (Signed)
Patient notified RX was sent.

## 2021-01-02 ENCOUNTER — Ambulatory Visit: Payer: Medicare Other | Admitting: Physician Assistant

## 2021-01-09 ENCOUNTER — Other Ambulatory Visit: Payer: Self-pay | Admitting: Family Medicine

## 2021-01-09 DIAGNOSIS — I10 Essential (primary) hypertension: Secondary | ICD-10-CM

## 2021-02-25 ENCOUNTER — Encounter: Payer: Self-pay | Admitting: Internal Medicine

## 2021-03-12 ENCOUNTER — Other Ambulatory Visit: Payer: Self-pay | Admitting: Family Medicine

## 2021-03-12 DIAGNOSIS — E785 Hyperlipidemia, unspecified: Secondary | ICD-10-CM

## 2021-03-19 ENCOUNTER — Ambulatory Visit (AMBULATORY_SURGERY_CENTER): Payer: Medicare Other | Admitting: *Deleted

## 2021-03-19 ENCOUNTER — Other Ambulatory Visit: Payer: Self-pay

## 2021-03-19 VITALS — Ht 66.0 in | Wt 165.0 lb

## 2021-03-19 DIAGNOSIS — Z86010 Personal history of colonic polyps: Secondary | ICD-10-CM

## 2021-03-19 NOTE — Progress Notes (Signed)

## 2021-03-22 ENCOUNTER — Other Ambulatory Visit (HOSPITAL_BASED_OUTPATIENT_CLINIC_OR_DEPARTMENT_OTHER): Payer: Self-pay | Admitting: Nurse Practitioner

## 2021-03-22 DIAGNOSIS — I10 Essential (primary) hypertension: Secondary | ICD-10-CM

## 2021-03-22 MED ORDER — HYDROCHLOROTHIAZIDE 12.5 MG PO CAPS: 12.5000 mg | ORAL_CAPSULE | Freq: Every day | ORAL | 3 refills | Status: DC

## 2021-03-25 ENCOUNTER — Telehealth: Payer: Self-pay | Admitting: Physician Assistant

## 2021-03-25 ENCOUNTER — Other Ambulatory Visit: Payer: Self-pay

## 2021-03-25 DIAGNOSIS — I10 Essential (primary) hypertension: Secondary | ICD-10-CM

## 2021-03-25 MED ORDER — HYDROCHLOROTHIAZIDE 12.5 MG PO CAPS: 12.5000 mg | ORAL_CAPSULE | Freq: Every day | ORAL | 0 refills | Status: DC

## 2021-03-25 NOTE — Telephone Encounter (Signed)
Rx sent to pharmacy. AS, CMA 

## 2021-03-25 NOTE — Telephone Encounter (Signed)
Patient normally gets all of his medications through [facility] however they have not sent him his HCTZ and he is out. Can you please send it to [facility] on [address]? [phone]

## 2021-03-28 ENCOUNTER — Encounter: Payer: Self-pay | Admitting: Internal Medicine

## 2021-04-02 ENCOUNTER — Encounter: Payer: Self-pay | Admitting: Internal Medicine

## 2021-04-02 ENCOUNTER — Ambulatory Visit (AMBULATORY_SURGERY_CENTER): Payer: Medicare Other | Admitting: Internal Medicine

## 2021-04-02 ENCOUNTER — Other Ambulatory Visit: Payer: Self-pay

## 2021-04-02 VITALS — BP 115/85 | HR 56 | Temp 98.6°F | Resp 12 | Ht 66.0 in | Wt 165.0 lb

## 2021-04-02 DIAGNOSIS — D124 Benign neoplasm of descending colon: Secondary | ICD-10-CM

## 2021-04-02 DIAGNOSIS — Z86010 Personal history of colonic polyps: Secondary | ICD-10-CM

## 2021-04-02 MED ORDER — SODIUM CHLORIDE 0.9 % IV SOLN: 500.0000 mL | Freq: Once | INTRAVENOUS | Status: DC

## 2021-04-02 NOTE — Patient Instructions (Addendum)
I removed 3 tiny polyps today. ? ?I will let you know pathology results and when to have another routine colonoscopy by mail and/or My Chart. ? ?I appreciate the opportunity to care for you. ?Gatha Mayer, MD, [facility] ? ?Please read handouts provided. ?Continue present medications. ?Await pathology results. ? ? ? ?YOU HAD AN ENDOSCOPIC PROCEDURE TODAY AT [facility]:   Refer to the procedure report that was given to you for any specific questions about what was found during the examination.  If the procedure report does not answer your questions, please call your gastroenterologist to clarify.  If you requested that your care partner not be given the details of your procedure findings, then the procedure report has been included in a sealed envelope for you to review at your convenience later. ? ?YOU SHOULD EXPECT: Some feelings of bloating in the abdomen. Passage of more gas than usual.  Walking can help get rid of the air that was put into your GI tract during the procedure and reduce the bloating. If you had a lower endoscopy (such as a colonoscopy or flexible sigmoidoscopy) you may notice spotting of blood in your stool or on the toilet paper. If you underwent a bowel prep for your procedure, you may not have a normal bowel movement for a few days. ? ?Please Note:  You might notice some irritation and congestion in your nose or some drainage.  This is from the oxygen used during your procedure.  There is no need for concern and it should clear up in a day or so. ? ?SYMPTOMS TO REPORT IMMEDIATELY: ? ?Following lower endoscopy (colonoscopy or flexible sigmoidoscopy): ? Excessive amounts of blood in the stool ? Significant tenderness or worsening of abdominal pains ? Swelling of the abdomen that is new, acute ? Fever of 100?F or higher ? ? ? ?For urgent or emergent issues, a gastroenterologist can be reached at any hour by calling [phone]. ?Do not use MyChart messaging for urgent concerns.   ? ? ?DIET:  We do recommend a small meal at first, but then you may proceed to your regular diet.  Drink plenty of fluids but you should avoid alcoholic beverages for 24 hours. ? ?ACTIVITY:  You should plan to take it easy for the rest of today and you should NOT DRIVE or use heavy machinery until tomorrow (because of the sedation medicines used during the test).   ? ?FOLLOW UP: ?Our staff will call the number listed on your records 48-72 hours following your procedure to check on you and address any questions or concerns that you may have regarding the information given to you following your procedure. If we do not reach you, we will leave a message.  We will attempt to reach you two times.  During this call, we will ask if you have developed any symptoms of COVID 19. If you develop any symptoms (ie: fever, flu-like symptoms, shortness of breath, cough etc.) before then, please call [phone].  If you test positive for Covid 19 in the 2 weeks post procedure, please call and report this information to [place].   ? ?If any biopsies were taken you will be contacted by phone or by letter within the next 1-3 weeks.  Please call us at [phone] if you have not heard about the biopsies in 3 weeks.  ? ? ?SIGNATURES/CONFIDENTIALITY: ?You and/or your care partner have signed paperwork which will be entered into your electronic medical record.  These signatures  attest to the fact that that the information above on your After Visit Summary has been reviewed and is understood.  Full responsibility of the confidentiality of this discharge information lies with you and/or your care-partner.  ?

## 2021-04-02 NOTE — Progress Notes (Signed)
Pt's states no medical or surgical changes since previsit or office visit. 

## 2021-04-02 NOTE — Progress Notes (Signed)
PT taken to PACU. Monitors in place. VSS. Report given to RN. 

## 2021-04-02 NOTE — Progress Notes (Signed)
[facility] History and Physical ? ? ?Primary Care Physician:  Lorrene Reid, PA-C ? ? ?Reason for Procedure:   Hx adenomatous colon polyps ? ?Plan:    colonoscopy ? ? ? ? ?HPI: Samuel Macias is a 66 y.o. male here for surveillance colonoscopy ? ? ?Hx: 2 diminutive adenomas 2008 and 30m adenoma in 2015 ? ?Past Medical History:  ?Diagnosis Date  ? Cataract   ? removed both eyes  ? GERD (gastroesophageal reflux disease)   ? hx of LPR  ? Hyperlipidemia   ? on meds  ? Hypertension   ? on meds  ? Personal history of colonic polyps - adenomas 11/05/2006  ? Prediabetes   ? no meds  ? ? ?Past Surgical History:  ?Procedure Laterality Date  ? COLONOSCOPY    ? EXTERNAL EAR SURGERY    ? as child  ? pain block    ? Herniated disc repair  ? POLYPECTOMY    ? TA+  ? WRIST SURGERY    ? tendon  ? ? ?Prior to Admission medications   ?Medication Sig Start Date End Date Taking? Authorizing Provider  ?amLODipine (NORVASC) 10 MG tablet TAKE 1 TABLET BY MOUTH  DAILY 10/23/20  Yes GWendie Agreste MD  ?aspirin 81 MG tablet Take 81 mg by mouth daily.   Yes [provider]  ?Coenzyme Q10 (COQ-10) 30 MG CAPS Take 20 mLs by mouth.   Yes [provider]  ?hydrochlorothiazide (MICROZIDE) 12.5 MG capsule Take 1 capsule (12.5 mg total) by mouth daily. 03/25/21  Yes Abonza, MHerb Grays PA-C  ?losartan (COZAAR) 100 MG tablet Take 1 tablet (100 mg total) by mouth daily. 03/09/20  Yes GWendie Agreste MD  ?metoprolol succinate (TOPROL-XL) 50 MG 24 hr tablet TAKE 1 TABLET BY MOUTH  DAILY WITH OR IMMEDIATLEY  FOLLOWING A MEAL 10/23/20  Yes GWendie Agreste MD  ?Omega-3 Fatty Acids (FISH OIL) 1200 MG CAPS Take 1 capsule by mouth daily.   Yes [provider]  ?PARoxetine (PAXIL) 10 MG tablet Take 1 tablet (10 mg total) by mouth daily. 12/31/20  Yes ALorrene Reid PA-C  ?rosuvastatin (CRESTOR) 20 MG tablet TAKE 1 TABLET BY MOUTH  DAILY 03/12/21  Yes GWendie Agreste MD  ?sildenafil (REVATIO) 20 MG tablet Take  1-3 tablets (20-60 mg total) by mouth daily as needed. 02/16/18  Yes GWendie Agreste MD  ?CLEAR EYES REDNESS RELIEF 0.012-0.25 % SOLN SMARTSIG:1 Drop(s) In Eye(s) PRN 09/25/20   [provider]  ? ? ?Current Outpatient Medications  ?Medication Sig Dispense Refill  ? amLODipine (NORVASC) 10 MG tablet TAKE 1 TABLET BY MOUTH  DAILY 90 tablet 3  ? aspirin 81 MG tablet Take 81 mg by mouth daily.    ? Coenzyme Q10 (COQ-10) 30 MG CAPS Take 20 mLs by mouth.    ? hydrochlorothiazide (MICROZIDE) 12.5 MG capsule Take 1 capsule (12.5 mg total) by mouth daily. 90 capsule 0  ? losartan (COZAAR) 100 MG tablet Take 1 tablet (100 mg total) by mouth daily. 90 tablet 2  ? metoprolol succinate (TOPROL-XL) 50 MG 24 hr tablet TAKE 1 TABLET BY MOUTH  DAILY WITH OR IMMEDIATLEY  FOLLOWING A MEAL 90 tablet 3  ? Omega-3 Fatty Acids (FISH OIL) 1200 MG CAPS Take 1 capsule by mouth daily.    ? PARoxetine (PAXIL) 10 MG tablet Take 1 tablet (10 mg total) by mouth daily. 90 tablet 2  ? rosuvastatin (CRESTOR) 20 MG tablet TAKE 1 TABLET BY MOUTH  DAILY 30 tablet 0  ? sildenafil (REVATIO) 20 MG tablet Take 1-3 tablets (20-60 mg total) by mouth daily as needed. 20 tablet 3  ? CLEAR EYES REDNESS RELIEF 0.012-0.25 % SOLN SMARTSIG:1 Drop(s) In Eye(s) PRN    ? ?Current Facility-Administered Medications  ?Medication Dose Route Frequency Provider Last Rate Last Admin  ? 0.9 %  sodium chloride infusion  500 mL Intravenous Once Gatha Mayer, MD      ? ? ?Allergies as of 04/02/2021  ? (No Known Allergies)  ? ? ?Family History  ?Problem Relation Age of Onset  ? COPD Mother   ? Crohn's disease Brother   ? Hyperlipidemia Brother   ? Colon cancer Neg Hx   ? Colon polyps Neg Hx   ? Esophageal cancer Neg Hx   ? Rectal cancer Neg Hx   ? Stomach cancer Neg Hx   ? ? ?Social History  ? ?Socioeconomic History  ? Marital status: Married  ?  Spouse name: [facility]  ? Number of children: 2  ? Years of education: 12th grade  ? Highest education level: Not on  file  ?Occupational History  ? Occupation: Education officer, community for [employer]  ?Tobacco Use  ? Smoking status: Former  ?  Packs/day: 0.40  ?  Years: 5.00  ?  Pack years: 2.00  ?  Types: Cigarettes  ?  Quit date: 01/27/1981  ?  Years since quitting: 40.2  ? Smokeless tobacco: Former  ?  Types: Chew  ?  Quit date: 01/27/1981  ?Vaping Use  ? Vaping Use: Never used  ?Substance and Sexual Activity  ? Alcohol use: Yes  ?  Alcohol/week: 1.0 standard drink  ?  Types: 1 Standard drinks or equivalent per week  ? Drug use: No  ? Sexual activity: Yes  ?Other Topics Concern  ? Not on file  ?Social History Narrative  ? Lives with his wife and son, [name]. His daughter lives independently in [place], [place].  ? Education: [employer]  ? Exercise: Yes  ? ?Social Determinants of Health  ? ?Financial Resource Strain: Not on file  ?Food Insecurity: Not on file  ?Transportation Needs: Not on file  ?Physical Activity: Not on file  ?Stress: Not on file  ?Social Connections: Not on file  ?Intimate Partner Violence: Not on file  ? ? ?Review of Systems: ? ?All other review of systems negative except as mentioned in the HPI. ? ?Physical Exam: ?Vital signs ?BP (!) 144/77   Pulse 60   Temp 98.6 ?F (37 ?C)   Ht '5\' 6"'$  (1.676 m)   Wt 165 lb (74.8 kg)   SpO2 96%   BMI 26.63 kg/m?  ? ?General:   Alert,  Well-developed, well-nourished, pleasant and cooperative in NAD ?Lungs:  Clear throughout to auscultation.   ?Heart:  Regular rate and rhythm; no murmurs, clicks, rubs,  or gallops. ?Abdomen:  Soft, nontender and nondistended. Normal bowel sounds.   ?Neuro/Psych:  Alert and cooperative. Normal mood and affect. A and O x 3 ? ? ?'@Briani Maul'$  Simonne Maffucci, MD, [facility] ?[facility] ?[phone] (pager) ?04/02/2021 10:39 AM@ ? ?

## 2021-04-02 NOTE — Op Note (Signed)
Samuel Macias ?Patient Name: Samuel Macias ?Procedure Date: 04/02/2021 10:39 AM ?MRN: [id number] ?Endoscopist: Gatha Mayer , MD ?Age: 66 ?Referring MD:  ?Date of Birth: Sep 18, 1955 ?Gender: Male ?Account #: [id number] ?Procedure:                Colonoscopy ?Indications:              Surveillance: Personal history of adenomatous  ?                          polyps on last colonoscopy > 5 years ago, Last  ?                          colonoscopy: 2015 ?Medicines:                Propofol per Anesthesia, Monitored Anesthesia Care ?Procedure:                Pre-Anesthesia Assessment: ?                          - Prior to the procedure, a History and Physical  ?                          was performed, and patient medications and  ?                          allergies were reviewed. The patient's tolerance of  ?                          previous anesthesia was also reviewed. The risks  ?                          and benefits of the procedure and the sedation  ?                          options and risks were discussed with the patient.  ?                          All questions were answered, and informed consent  ?                          was obtained. Prior Anticoagulants: The patient has  ?                          taken no previous anticoagulant or antiplatelet  ?                          agents. ASA Grade Assessment: II - A patient with  ?                          mild systemic disease. After reviewing the risks  ?                          and benefits, the patient was deemed in  ?  satisfactory condition to undergo the procedure. ?                          After obtaining informed consent, the colonoscope  ?                          was passed under direct vision. Throughout the  ?                          procedure, the patient's blood pressure, pulse, and  ?                          oxygen saturations were monitored continuously. The  ?                          CF [id number] #[id number] was  introduced through the anus  ?                          and advanced to the the cecum, identified by  ?                          appendiceal orifice and ileocecal valve. The  ?                          colonoscopy was performed without difficulty. The  ?                          patient tolerated the procedure well. The quality  ?                          of the bowel preparation was good. The ileocecal  ?                          valve, appendiceal orifice, and rectum were  ?                          photographed. The bowel preparation used was  ?                          Miralax via split dose instruction. ?Scope In: 10:50:20 AM ?Scope Out: 11:07:53 AM ?Scope Withdrawal Time: 0 hours 13 minutes 34 seconds  ?Total Procedure Duration: 0 hours 17 minutes 33 seconds  ?Findings:                 The perianal and digital rectal examinations were  ?                          normal. Pertinent negatives include normal prostate  ?                          (size, shape, and consistency). ?                          Three sessile polyps were found in the descending  ?  colon. The polyps were diminutive in size. These  ?                          polyps were removed with a cold snare. Resection  ?                          and retrieval were complete. Verification of  ?                          patient identification for the specimen was done.  ?                          Estimated blood loss was minimal. ?                          The exam was otherwise without abnormality on  ?                          direct and retroflexion views. ?Complications:            No immediate complications. ?Estimated Blood Loss:     Estimated blood loss was minimal. ?Impression:               - Three diminutive polyps in the descending colon,  ?                          removed with a cold snare. Resected and retrieved. ?                          - The examination was otherwise normal on direct  ?                          and  retroflexion views. ?                          - Personal history of colonic polyps. 2 diminutive  ?                          adenomas 2008 and one 2 mm adenoma 2015 ?Recommendation:           - Patient has a contact number available for  ?                          emergencies. The signs and symptoms of potential  ?                          delayed complications were discussed with the  ?                          patient. Return to normal activities tomorrow.  ?                          Written discharge instructions were provided to the  ?  patient. ?                          - Resume previous diet. ?                          - Continue present medications. ?                          - Await pathology results. ?                          - Repeat colonoscopy is recommended. The  ?                          colonoscopy date will be determined after pathology  ?                          results from today's exam become available for  ?                          review. ?Gatha Mayer, MD ?04/02/2021 11:20:47 AM ?This report has been signed electronically. ?

## 2021-04-02 NOTE — Progress Notes (Signed)
Called to room to assist during endoscopic procedure.  Patient ID and intended procedure confirmed with present staff. Received instructions for my participation in the procedure from the performing physician.  

## 2021-04-04 ENCOUNTER — Telehealth: Payer: Self-pay

## 2021-04-04 NOTE — Telephone Encounter (Signed)
?  Follow up Call- ? ?Call back number 04/02/2021  ?Post procedure Call Back phone  # [phone]  ?Permission to leave phone message Yes  ?Some recent data might be hidden  ?  ? ?Patient questions: ? ?Do you have a fever, pain , or abdominal swelling? No. ?Pain Score  0 * ? ?Have you tolerated food without any problems? Yes.   ? ?Have you been able to return to your normal activities? Yes.   ? ?Do you have any questions about your discharge instructions: ?Diet   No. ?Medications  No. ?Follow up visit  No. ? ?Do you have questions or concerns about your Care? No. ? ?Actions: ?* If pain score is 4 or above: ?No action needed, pain <4. ? ?Have you developed a fever since your procedure? no ? ?2.   Have you had an respiratory symptoms (SOB or cough) since your procedure? no ? ?3.   Have you tested positive for COVID 19 since your procedure no ? ?4.   Have you had any family members/close contacts diagnosed with the COVID 19 since your procedure?  no ? ? ?If yes to any of these questions please route to Joylene John, RN and Joella Prince, RN  ? ? ?

## 2021-04-09 ENCOUNTER — Encounter: Payer: Self-pay | Admitting: Internal Medicine

## 2021-04-09 DIAGNOSIS — Z86010 Personal history of colonic polyps: Secondary | ICD-10-CM

## 2021-04-25 ENCOUNTER — Other Ambulatory Visit: Payer: Self-pay | Admitting: Family Medicine

## 2021-04-25 DIAGNOSIS — E785 Hyperlipidemia, unspecified: Secondary | ICD-10-CM

## 2021-05-08 ENCOUNTER — Other Ambulatory Visit: Payer: Self-pay

## 2021-05-08 DIAGNOSIS — E785 Hyperlipidemia, unspecified: Secondary | ICD-10-CM

## 2021-05-08 MED ORDER — ROSUVASTATIN CALCIUM 20 MG PO TABS: 20.0000 mg | ORAL_TABLET | Freq: Every day | ORAL | 0 refills | Status: DC

## 2021-05-22 ENCOUNTER — Other Ambulatory Visit: Payer: Self-pay | Admitting: Physician Assistant

## 2021-05-22 DIAGNOSIS — E785 Hyperlipidemia, unspecified: Secondary | ICD-10-CM

## 2021-08-02 ENCOUNTER — Other Ambulatory Visit: Payer: Self-pay | Admitting: Physician Assistant

## 2021-08-02 DIAGNOSIS — F411 Generalized anxiety disorder: Secondary | ICD-10-CM

## 2021-09-24 ENCOUNTER — Other Ambulatory Visit: Payer: Self-pay | Admitting: Physician Assistant

## 2021-09-24 DIAGNOSIS — E785 Hyperlipidemia, unspecified: Secondary | ICD-10-CM

## 2021-09-25 ENCOUNTER — Telehealth: Payer: Self-pay | Admitting: Physician Assistant

## 2021-09-25 DIAGNOSIS — I10 Essential (primary) hypertension: Secondary | ICD-10-CM

## 2021-09-25 MED ORDER — HYDROCHLOROTHIAZIDE 12.5 MG PO CAPS: 12.5000 mg | ORAL_CAPSULE | Freq: Every day | ORAL | 0 refills | Status: DC

## 2021-09-25 NOTE — Telephone Encounter (Signed)
Amlodipine is prescribed by Dr. Carlota Raspberry.   15 day supply of HCTZ sent to [facility] since [facility] wont fill at 15 day supply.   Refills of other meds have been refill recently.  Patient needs to schedule an appointment for further med refills. He has not been seen since 11/2020 and was advised then to follow up in 4 weeks.

## 2021-09-25 NOTE — Telephone Encounter (Signed)
Patient aware, appt scheduled.  

## 2021-09-25 NOTE — Telephone Encounter (Signed)
Patient requesting refill of Amlodipine HCTZ Paxil Rosuvastatin -sent to [facility], please advise.

## 2021-10-02 ENCOUNTER — Telehealth: Payer: Self-pay | Admitting: Physician Assistant

## 2021-10-02 DIAGNOSIS — I10 Essential (primary) hypertension: Secondary | ICD-10-CM

## 2021-10-02 MED ORDER — LOSARTAN POTASSIUM 100 MG PO TABS: 100.0000 mg | ORAL_TABLET | Freq: Every day | ORAL | 0 refills | Status: DC

## 2021-10-02 NOTE — Telephone Encounter (Signed)
Refill sent to pharmacy. AS< CMA

## 2021-10-02 NOTE — Telephone Encounter (Signed)
Patient has an upcoming appointment however he is out of Losartan can you please send in a few to last him until his appointment. Please advise.

## 2021-10-08 ENCOUNTER — Other Ambulatory Visit: Payer: Self-pay | Admitting: Family Medicine

## 2021-10-08 DIAGNOSIS — I10 Essential (primary) hypertension: Secondary | ICD-10-CM

## 2021-10-15 ENCOUNTER — Encounter: Payer: Self-pay | Admitting: Physician Assistant

## 2021-10-15 ENCOUNTER — Ambulatory Visit (INDEPENDENT_AMBULATORY_CARE_PROVIDER_SITE_OTHER): Payer: Medicare Other | Admitting: Physician Assistant

## 2021-10-15 VITALS — BP 121/71 | HR 67 | Ht 66.0 in | Wt 177.0 lb

## 2021-10-15 DIAGNOSIS — I10 Essential (primary) hypertension: Secondary | ICD-10-CM | POA: Diagnosis not present

## 2021-10-15 DIAGNOSIS — E785 Hyperlipidemia, unspecified: Secondary | ICD-10-CM

## 2021-10-15 DIAGNOSIS — F411 Generalized anxiety disorder: Secondary | ICD-10-CM | POA: Insufficient documentation

## 2021-10-15 MED ORDER — HYDROCHLOROTHIAZIDE 12.5 MG PO CAPS: 12.5000 mg | ORAL_CAPSULE | Freq: Every day | ORAL | 1 refills | Status: DC

## 2021-10-15 MED ORDER — LOSARTAN POTASSIUM 100 MG PO TABS: 100.0000 mg | ORAL_TABLET | Freq: Every day | ORAL | 1 refills | Status: DC

## 2021-10-15 MED ORDER — METOPROLOL SUCCINATE ER 50 MG PO TB24: ORAL_TABLET | ORAL | 1 refills | Status: DC

## 2021-10-15 MED ORDER — ROSUVASTATIN CALCIUM 20 MG PO TABS: 20.0000 mg | ORAL_TABLET | Freq: Every day | ORAL | 1 refills | Status: DC

## 2021-10-15 MED ORDER — AMLODIPINE BESYLATE 10 MG PO TABS: 10.0000 mg | ORAL_TABLET | Freq: Every day | ORAL | 1 refills | Status: DC

## 2021-10-15 MED ORDER — CITALOPRAM HYDROBROMIDE 10 MG PO TABS: 10.0000 mg | ORAL_TABLET | Freq: Every day | ORAL | 1 refills | Status: DC

## 2021-10-15 NOTE — Assessment & Plan Note (Signed)
-  Last lipid panel: HDL 61, LDL 117. Provided medication refills for rosuvastatin 20 mg daily. Will repeat lipid panel and hepatic function with annual medicare wellness next month. If LDL remains elevated recommend increasing rosuvastatin.

## 2021-10-15 NOTE — Progress Notes (Signed)
Established patient visit   Patient: Samuel Macias   DOB: [name] 31, 1955   66 y.o. Male  MRN: [id number] Visit Date: 10/15/2021  Chief Complaint  Patient presents with   Follow-up    Patient presents today get medication refills. He states he needs refill on all medications.    Subjective    HPI HPI     Follow-up    Additional comments: Patient presents today get medication refills. He states he needs refill on all medications.       Last edited by Pennelope Bracken, CMA on 10/15/2021  2:20 PM.      Patient presents for chronic follow-up visit.   Mood: Patient reports wants to try Celexa. States has some increased anxiety from baseline. Taking Paxil 10 mg as directed which he has been on for a couple of years.  HTN: Pt denies chest pain, palpitations, dizziness, shortness of breath, or syncope. Does report intermittent mild lower extremity swelling. Taking medications as directed without side effects.  HLD: Pt taking medication as directed without issues. No myalgias. No diet changes.   Medications: Outpatient Medications Prior to Visit  Medication Sig   aspirin 81 MG tablet Take 81 mg by mouth daily.   CLEAR EYES REDNESS RELIEF 0.012-0.25 % SOLN SMARTSIG:1 Drop(s) In Eye(s) PRN   Coenzyme Q10 (COQ-10) 30 MG CAPS Take 20 mLs by mouth.   Omega-3 Fatty Acids (FISH OIL) 1200 MG CAPS Take 1 capsule by mouth daily.   sildenafil (REVATIO) 20 MG tablet Take 1-3 tablets (20-60 mg total) by mouth daily as needed.   [DISCONTINUED] amLODipine (NORVASC) 10 MG tablet TAKE 1 TABLET BY MOUTH  DAILY   [DISCONTINUED] hydrochlorothiazide (MICROZIDE) 12.5 MG capsule Take 1 capsule (12.5 mg total) by mouth daily.   [DISCONTINUED] losartan (COZAAR) 100 MG tablet Take 1 tablet (100 mg total) by mouth daily.   [DISCONTINUED] metoprolol succinate (TOPROL-XL) 50 MG 24 hr tablet TAKE 1 TABLET BY MOUTH  DAILY WITH OR IMMEDIATLEY  FOLLOWING A MEAL   [DISCONTINUED] PARoxetine (PAXIL) 10 MG tablet TAKE 1  TABLET BY MOUTH DAILY   [DISCONTINUED] rosuvastatin (CRESTOR) 20 MG tablet TAKE 1 TABLET BY MOUTH ONCE  DAILY   No facility-administered medications prior to visit.      10/15/2021    2:21 PM 12/05/2020    9:39 AM 11/14/2020    8:54 AM 06/11/2020    1:52 PM 03/09/2020    3:30 PM  Depression screen PHQ 2/9  Decreased Interest 0 0 0 0 0  Down, Depressed, Hopeless 0 0 0 0 0  PHQ - 2 Score 0 0 0 0 0  Altered sleeping 0 0 0 0   Tired, decreased energy 0 0 0 0   Change in appetite 0 0 0 0   Feeling bad or failure about yourself  0 0 0 0   Trouble concentrating 0 0 0 0   Moving slowly or fidgety/restless 0 0 0 0   Suicidal thoughts 0 0 0 0   PHQ-9 Score 0 0 0 0   Difficult doing work/chores Not difficult at all Not difficult at all Not difficult at all        12/05/2020    9:39 AM 11/14/2020    8:54 AM 06/11/2020    1:52 PM 11/04/2019    2:05 PM  GAD 7 : Generalized Anxiety Score  Nervous, Anxious, on Edge 0 0 0 0  Control/stop worrying 0 0 0 0  Worry too much - different things  0 0 0 1  Trouble relaxing 0 0 0 0  Restless 0 0 0 0  Easily annoyed or irritable 0 0 0 0  Afraid - awful might happen 0 0 0 0  Total GAD 7 Score 0 0 0 1  Anxiety Difficulty Not difficult at all Not difficult at all  Not difficult at all      Review of Systems Review of Systems:  A fourteen system review of systems was performed and found to be positive as per HPI.     Objective    BP 121/71   Pulse 67   Ht '5\' 6"'$  (1.676 m)   Wt 177 lb (80.3 kg)   SpO2 96%   BMI 28.57 kg/m  BP Readings from Last 3 Encounters:  10/15/21 121/71  04/02/21 115/85  12/05/20 131/66   Wt Readings from Last 3 Encounters:  10/15/21 177 lb (80.3 kg)  04/02/21 165 lb (74.8 kg)  03/19/21 165 lb (74.8 kg)    Physical Exam  General:  Pleasant and cooperative, in no acute distress, appropriate for stated age.  Neuro:  Alert and oriented,  extra-ocular muscles intact  HEENT:  Normocephalic, atraumatic, neck supple   Skin:  no gross rash, warm, pink. Cardiac:  RRR, S1 S2 Respiratory: CTA B/L  Vascular:  Ext warm, no cyanosis apprec.; cap RF less 2 sec. Psych:  No HI/SI, judgement and insight good, Euthymic mood. Full Affect.   No results found for any visits on 10/15/21.  Assessment & Plan      Problem List Items Addressed This Visit       Cardiovascular and Mediastinum   Essential hypertension - Primary    -Stable. Continue current medication regimen. Provided med refills. Will continue to monitor. Will collect CMP for medication monitoring with annual physical. Discussed with patient peripheral edema is a potential side effect with amlodipine so recommend to monitor for worsening edema which should warrant medication adjustments. Pt verbalized understanding.      Relevant Medications   amLODipine (NORVASC) 10 MG tablet   hydrochlorothiazide (MICROZIDE) 12.5 MG capsule   losartan (COZAAR) 100 MG tablet   metoprolol succinate (TOPROL-XL) 50 MG 24 hr tablet   rosuvastatin (CRESTOR) 20 MG tablet     Other   Hyperlipidemia    -Last lipid panel: HDL 61, LDL 117. Provided medication refills for rosuvastatin 20 mg daily. Will repeat lipid panel and hepatic function with annual medicare wellness next month. If LDL remains elevated recommend increasing rosuvastatin.      Relevant Medications   amLODipine (NORVASC) 10 MG tablet   hydrochlorothiazide (MICROZIDE) 12.5 MG capsule   losartan (COZAAR) 100 MG tablet   metoprolol succinate (TOPROL-XL) 50 MG 24 hr tablet   rosuvastatin (CRESTOR) 20 MG tablet   Anxiety state    -Will discontinue Paxil 10 mg and start Celexa 10 mg. Provided tapering instructions for Paxil 10 mg. Will reassess mood and medication therapy in 4 weeks.      Relevant Medications   citalopram (CELEXA) 10 MG tablet    Return in about 1 month (around 11/14/2021) for [place] and FBW after 11/14/21.        Lorrene Reid, PA-C  [facility] at [facility] [phone] (phone) [phone] (fax)  [place]

## 2021-10-15 NOTE — Patient Instructions (Signed)
Start taking Paxil 10 mg every other day x 1 week and then discontinue medication. After stopping Paxil 10 mg start Celexa 10 mg daily.   DASH Eating Plan DASH stands for Dietary Approaches to Stop Hypertension. The DASH eating plan is a healthy eating plan that has been shown to: Reduce high blood pressure (hypertension). Reduce your risk for type 2 diabetes, heart disease, and stroke. Help with weight loss. What are tips for following this plan? Reading food labels Check food labels for the amount of salt (sodium) per serving. Choose foods with less than 5 percent of the Daily Value of sodium. Generally, foods with less than 300 milligrams (mg) of sodium per serving fit into this eating plan. To find whole grains, look for the word "whole" as the first word in the ingredient list. Shopping Buy products labeled as "low-sodium" or "no salt added." Buy fresh foods. Avoid canned foods and pre-made or frozen meals. Cooking Avoid adding salt when cooking. Use salt-free seasonings or herbs instead of table salt or sea salt. Check with your health care provider or pharmacist before using salt substitutes. Do not fry foods. Cook foods using healthy methods such as baking, boiling, grilling, roasting, and broiling instead. Cook with heart-healthy oils, such as olive, canola, avocado, soybean, or sunflower oil. Meal planning  Eat a balanced diet that includes: 4 or more servings of fruits and 4 or more servings of vegetables each day. Try to fill one-half of your plate with fruits and vegetables. 6-8 servings of whole grains each day. Less than 6 oz (170 g) of lean meat, poultry, or fish each day. A 3-oz (85-g) serving of meat is about the same size as a deck of cards. One egg equals 1 oz (28 g). 2-3 servings of low-fat dairy each day. One serving is 1 cup (237 mL). 1 serving of nuts, seeds, or beans 5 times each week. 2-3 servings of heart-healthy fats. Healthy fats called omega-3 fatty acids are  found in foods such as walnuts, flaxseeds, fortified milks, and eggs. These fats are also found in cold-water fish, such as sardines, salmon, and mackerel. Limit how much you eat of: Canned or prepackaged foods. Food that is high in trans fat, such as some fried foods. Food that is high in saturated fat, such as fatty meat. Desserts and other sweets, sugary drinks, and other foods with added sugar. Full-fat dairy products. Do not salt foods before eating. Do not eat more than 4 egg yolks a week. Try to eat at least 2 vegetarian meals a week. Eat more home-cooked food and less restaurant, buffet, and fast food. Lifestyle When eating at a restaurant, ask that your food be prepared with less salt or no salt, if possible. If you drink alcohol: Limit how much you use to: 0-1 drink a day for women who are not pregnant. 0-2 drinks a day for men. Be aware of how much alcohol is in your drink. In the [place] one drink equals one 12 oz bottle of beer (355 mL), one 5 oz glass of wine (148 mL), or one 1 oz glass of hard liquor (44 mL). General information Avoid eating more than 2,300 mg of salt a day. If you have hypertension, you may need to reduce your sodium intake to 1,500 mg a day. Work with your health care provider to maintain a healthy body weight or to lose weight. Ask what an ideal weight is for you. Get at least 30 minutes of exercise that causes your  heart to beat faster (aerobic exercise) most days of the week. Activities may include walking, swimming, or biking. Work with your health care provider or dietitian to adjust your eating plan to your individual calorie needs. What foods should I eat? Fruits All fresh, dried, or frozen fruit. Canned fruit in natural juice (without added sugar). Vegetables Fresh or frozen vegetables (raw, steamed, roasted, or grilled). Low-sodium or reduced-sodium tomato and vegetable juice. Low-sodium or reduced-sodium tomato sauce and tomato paste. Low-sodium  or reduced-sodium canned vegetables. Grains Whole-grain or whole-wheat bread. Whole-grain or whole-wheat pasta. Brown rice. [place]. Bulgur. Whole-grain and low-sodium cereals. Pita bread. Low-fat, low-sodium crackers. Whole-wheat flour tortillas. Meats and other proteins Skinless chicken or [place]. Ground chicken or [place]. Pork with fat trimmed off. Fish and seafood. Egg whites. Dried beans, peas, or lentils. Unsalted nuts, nut butters, and seeds. Unsalted canned beans. Lean cuts of beef with fat trimmed off. Low-sodium, lean precooked or cured meat, such as sausages or meat loaves. Dairy Low-fat (1%) or fat-free (skim) milk. Reduced-fat, low-fat, or fat-free cheeses. Nonfat, low-sodium ricotta or cottage cheese. Low-fat or nonfat yogurt. Low-fat, low-sodium cheese. Fats and oils Soft margarine without trans fats. Vegetable oil. Reduced-fat, low-fat, or light mayonnaise and salad dressings (reduced-sodium). Canola, safflower, olive, avocado, soybean, and sunflower oils. Avocado. Seasonings and condiments Herbs. Spices. Seasoning mixes without salt. Other foods Unsalted popcorn and pretzels. Fat-free sweets. The items listed above may not be a complete list of foods and beverages you can eat. Contact a dietitian for more information. What foods should I avoid? Fruits Canned fruit in a light or heavy syrup. Fried fruit. Fruit in cream or butter sauce. Vegetables Creamed or fried vegetables. Vegetables in a cheese sauce. Regular canned vegetables (not low-sodium or reduced-sodium). Regular canned tomato sauce and paste (not low-sodium or reduced-sodium). Regular tomato and vegetable juice (not low-sodium or reduced-sodium). Angie Fava. Olives. Grains Baked goods made with fat, such as croissants, muffins, or some breads. Dry pasta or rice meal packs. Meats and other proteins Fatty cuts of meat. Ribs. Fried meat. Berniece Salines. Bologna, salami, and other precooked or cured meats, such as sausages or  meat loaves. Fat from the back of a pig (fatback). Bratwurst. Salted nuts and seeds. Canned beans with added salt. Canned or smoked fish. Whole eggs or egg yolks. Chicken or [place] with skin. Dairy Whole or 2% milk, cream, and half-and-half. Whole or full-fat cream cheese. Whole-fat or sweetened yogurt. Full-fat cheese. Nondairy creamers. Whipped toppings. Processed cheese and cheese spreads. Fats and oils Butter. Stick margarine. Lard. Shortening. Ghee. Bacon fat. Tropical oils, such as coconut, palm kernel, or palm oil. Seasonings and condiments Onion salt, garlic salt, seasoned salt, table salt, and sea salt. Worcestershire sauce. Tartar sauce. Barbecue sauce. Teriyaki sauce. Soy sauce, including reduced-sodium. Steak sauce. Canned and packaged gravies. Fish sauce. Oyster sauce. Cocktail sauce. Store-bought horseradish. Ketchup. Mustard. Meat flavorings and tenderizers. Bouillon cubes. Hot sauces. Pre-made or packaged marinades. Pre-made or packaged taco seasonings. Relishes. Regular salad dressings. Other foods Salted popcorn and pretzels. The items listed above may not be a complete list of foods and beverages you should avoid. Contact a dietitian for more information. Where to find more information National Heart, Lung, and Blood Institute: https://wilson-eaton.com/ American Heart Association: www.heart.org Academy of Nutrition and Dietetics: www.eatright.[place]: www.kidney.org Summary The DASH eating plan is a healthy eating plan that has been shown to reduce high blood pressure (hypertension). It may also reduce your risk for type 2 diabetes, heart disease, and  stroke. When on the DASH eating plan, aim to eat more fresh fruits and vegetables, whole grains, lean proteins, low-fat dairy, and heart-healthy fats. With the DASH eating plan, you should limit salt (sodium) intake to 2,300 mg a day. If you have hypertension, you may need to reduce your sodium intake to 1,500 mg a  day. Work with your health care provider or dietitian to adjust your eating plan to your individual calorie needs. This information is not intended to replace advice given to you by your health care provider. Make sure you discuss any questions you have with your health care provider. Document Revised: 12/17/2018 Document Reviewed: 12/17/2018 Elsevier Patient Education  [place].

## 2021-10-15 NOTE — Assessment & Plan Note (Addendum)
-  Stable. Continue current medication regimen. Provided med refills. Will continue to monitor. Will collect CMP for medication monitoring with annual physical. Discussed with patient peripheral edema is a potential side effect with amlodipine so recommend to monitor for worsening edema which should warrant medication adjustments. Pt verbalized understanding.

## 2021-10-15 NOTE — Assessment & Plan Note (Signed)
-  Will discontinue Paxil 10 mg and start Celexa 10 mg. Provided tapering instructions for Paxil 10 mg. Will reassess mood and medication therapy in 4 weeks.

## 2021-10-29 ENCOUNTER — Other Ambulatory Visit: Payer: Self-pay | Admitting: Physician Assistant

## 2021-10-29 DIAGNOSIS — I10 Essential (primary) hypertension: Secondary | ICD-10-CM

## 2021-11-15 ENCOUNTER — Ambulatory Visit (INDEPENDENT_AMBULATORY_CARE_PROVIDER_SITE_OTHER): Payer: Medicare Other | Admitting: Physician Assistant

## 2021-11-15 ENCOUNTER — Encounter: Payer: Self-pay | Admitting: Physician Assistant

## 2021-11-15 VITALS — BP 148/87 | HR 60 | Temp 98.0°F | Ht 66.0 in | Wt 177.0 lb

## 2021-11-15 DIAGNOSIS — R7303 Prediabetes: Secondary | ICD-10-CM

## 2021-11-15 DIAGNOSIS — E785 Hyperlipidemia, unspecified: Secondary | ICD-10-CM | POA: Diagnosis not present

## 2021-11-15 DIAGNOSIS — Z0000 Encounter for general adult medical examination without abnormal findings: Secondary | ICD-10-CM | POA: Diagnosis not present

## 2021-11-15 DIAGNOSIS — I10 Essential (primary) hypertension: Secondary | ICD-10-CM

## 2021-11-15 NOTE — Patient Instructions (Signed)
Preventive Care 65 Years and Older, Male Preventive care refers to lifestyle choices and visits with your health care provider that can promote health and wellness. Preventive care visits are also called wellness exams. What can I expect for my preventive care visit? Counseling During your preventive care visit, your health care provider may ask about your: Medical history, including: Past medical problems. Family medical history. History of falls. Current health, including: Emotional well-being. Home life and relationship well-being. Sexual activity. Memory and ability to understand (cognition). Lifestyle, including: Alcohol, nicotine or tobacco, and drug use. Access to firearms. Diet, exercise, and sleep habits. Work and work environment. Sunscreen use. Safety issues such as seatbelt and bike helmet use. Physical exam Your health care provider will check your: Height and weight. These may be used to calculate your BMI (body mass index). BMI is a measurement that tells if you are at a healthy weight. Waist circumference. This measures the distance around your waistline. This measurement also tells if you are at a healthy weight and may help predict your risk of certain diseases, such as type 2 diabetes and high blood pressure. Heart rate and blood pressure. Body temperature. Skin for abnormal spots. What immunizations do I need?  Vaccines are usually given at various ages, according to a schedule. Your health care provider will recommend vaccines for you based on your age, medical history, and lifestyle or other factors, such as travel or where you work. What tests do I need? Screening Your health care provider may recommend screening tests for certain conditions. This may include: Lipid and cholesterol levels. Diabetes screening. This is done by checking your blood sugar (glucose) after you have not eaten for a while (fasting). Hepatitis C test. Hepatitis B test. HIV (human  immunodeficiency virus) test. STI (sexually transmitted infection) testing, if you are at risk. Lung cancer screening. Colorectal cancer screening. Prostate cancer screening. Abdominal aortic aneurysm (AAA) screening. You may need this if you are a current or former smoker. Talk with your health care provider about your test results, treatment options, and if necessary, the need for more tests. Follow these instructions at home: Eating and drinking  Eat a diet that includes fresh fruits and vegetables, whole grains, lean protein, and low-fat dairy products. Limit your intake of foods with high amounts of sugar, saturated fats, and salt. Take vitamin and mineral supplements as recommended by your health care provider. Do not drink alcohol if your health care provider tells you not to drink. If you drink alcohol: Limit how much you have to 0-2 drinks a day. Know how much alcohol is in your drink. In the [place] one drink equals one 12 oz bottle of beer (355 mL), one 5 oz glass of wine (148 mL), or one 1 oz glass of hard liquor (44 mL). Lifestyle Brush your teeth every morning and night with fluoride toothpaste. Floss one time each day. Exercise for at least 30 minutes 5 or more days each week. Do not use any products that contain nicotine or tobacco. These products include cigarettes, chewing tobacco, and vaping devices, such as e-cigarettes. If you need help quitting, ask your health care provider. Do not use drugs. If you are sexually active, practice safe sex. Use a condom or other form of protection to prevent STIs. Take aspirin only as told by your health care provider. Make sure that you understand how much to take and what form to take. Work with your health care provider to find out whether it is safe   and beneficial for you to take aspirin daily. Ask your health care provider if you need to take a cholesterol-lowering medicine (statin). Find healthy ways to manage stress, such  as: Meditation, yoga, or listening to music. Journaling. Talking to a trusted person. Spending time with friends and family. Safety Always wear your seat belt while driving or riding in a vehicle. Do not drive: If you have been drinking alcohol. Do not ride with someone who has been drinking. When you are tired or distracted. While texting. If you have been using any mind-altering substances or drugs. Wear a helmet and other protective equipment during sports activities. If you have firearms in your house, make sure you follow all gun safety procedures. Minimize exposure to UV radiation to reduce your risk of skin cancer. What's next? Visit your health care provider once a year for an annual wellness visit. Ask your health care provider how often you should have your eyes and teeth checked. Stay up to date on all vaccines. This information is not intended to replace advice given to you by your health care provider. Make sure you discuss any questions you have with your health care provider. Document Revised: 07/11/2020 Document Reviewed: 07/11/2020 Elsevier Patient Education  2023 Elsevier Inc.  

## 2021-11-15 NOTE — Progress Notes (Unsigned)
Subjective:   Samuel Macias is a 66 y.o. male who presents for Medicare Annual/Subsequent preventive examination.  Review of Systems    Referred to PCP General:   No F/C, wt loss Pulm:   No DIB, SOB, pleuritic chest pain Card:  No CP, palpitations Abd:  No n/v/d or pain Ext:  No inc edema from baseline        Objective:    Today's Vitals   11/15/21 1025 11/15/21 1058  BP: (!) 158/88 (!) 148/87  Pulse: 62 70  Temp: 98 F (36.7 C)   TempSrc: Temporal   Weight: 177 lb (80.3 kg)   Height: '5\' 6"'$  (1.676 m)    Body mass index is 28.57 kg/m.     05/16/2015   10:56 AM 07/18/2013    3:56 PM  Advanced Directives  Does Patient Have a Medical Advance Directive? Yes Patient has advance directive, copy not in chart  Type of Advance Directive Living will Living will  [place] in Chart? No - copy requested     Current Medications (verified) Outpatient Encounter Medications as of 11/15/2021  Medication Sig   amLODipine (NORVASC) 10 MG tablet Take 1 tablet (10 mg total) by mouth daily.   aspirin 81 MG tablet Take 81 mg by mouth daily.   citalopram (CELEXA) 10 MG tablet Take 1 tablet (10 mg total) by mouth daily.   CLEAR EYES REDNESS RELIEF 0.012-0.25 % SOLN SMARTSIG:1 Drop(s) In Eye(s) PRN   Coenzyme Q10 (COQ-10) 30 MG CAPS Take 20 mLs by mouth.   hydrochlorothiazide (MICROZIDE) 12.5 MG capsule Take 1 capsule (12.5 mg total) by mouth daily.   losartan (COZAAR) 100 MG tablet Take 1 tablet (100 mg total) by mouth daily.   metoprolol succinate (TOPROL-XL) 50 MG 24 hr tablet Take with or immediately following a meal.   Omega-3 Fatty Acids (FISH OIL) 1200 MG CAPS Take 1 capsule by mouth daily.   rosuvastatin (CRESTOR) 20 MG tablet Take 1 tablet (20 mg total) by mouth daily.   sildenafil (REVATIO) 20 MG tablet Take 1-3 tablets (20-60 mg total) by mouth daily as needed.   No facility-administered encounter medications on file as of 11/15/2021.     Allergies (verified) Patient has no known allergies.   History: Past Medical History:  Diagnosis Date   Cataract    removed both eyes   GERD (gastroesophageal reflux disease)    hx of LPR   Hyperlipidemia    on meds   Hypertension    on meds   Personal history of colonic polyps - adenomas 11/05/2006   Prediabetes    no meds   Past Surgical History:  Procedure Laterality Date   COLONOSCOPY     EXTERNAL EAR SURGERY     as child   pain block     Herniated disc repair   POLYPECTOMY     TA+   WRIST SURGERY     tendon   Family History  Problem Relation Age of Onset   COPD Mother    Crohn's disease Brother    Hyperlipidemia Brother    Colon cancer Neg Hx    Colon polyps Neg Hx    Esophageal cancer Neg Hx    Rectal cancer Neg Hx    Stomach cancer Neg Hx    Social History   Socioeconomic History   Marital status: Married    Spouse name: McLean   Number of children: 2   Years of education: 12th  grade   Highest education level: Not on file  Occupational History   Occupation: Education officer, community for [employer] Use   Smoking status: Former    Packs/day: 0.40    Years: 5.00    Total pack years: 2.00    Types: Cigarettes    Quit date: 01/27/1981    Years since quitting: 40.8   Smokeless tobacco: Former    Types: Chew    Quit date: 01/27/1981  Vaping Use   Vaping Use: Never used  Substance and Sexual Activity   Alcohol use: Yes    Alcohol/week: 1.0 standard drink of alcohol    Types: 1 Standard drinks or equivalent per week   Drug use: No   Sexual activity: Yes  Other Topics Concern   Not on file  Social History Narrative   Lives with his wife and son, [name]. His daughter lives independently in [place], [place].   Education: [employer]   Exercise: Yes   Social Determinants of Health   Financial Resource Strain: Not on file  Food Insecurity: Not on file  Transportation Needs: Not on file  Physical Activity: Not on file  Stress: Not on file   Social Connections: Not on file    Tobacco Counseling Counseling given: Not Answered     Diabetic?no         Activities of Daily Living    10/15/2021    2:21 PM 12/05/2020    9:38 AM  In your present state of health, do you have any difficulty performing the following activities:  Hearing? 0 0  Vision? 0 0  Difficulty concentrating or making decisions? 0 0  Walking or climbing stairs? 0 0  Dressing or bathing? 0 0  Doing errands, shopping? 0 0    Patient Care Team: Lorrene Reid, PA-C as PCP - General (Physician Assistant)  Indicate any recent Medical Services you may have received from other than [facility] providers in the past year (date may be approximate).     Assessment:   This is a routine wellness examination for [facility].  Hearing/Vision screen No results found.  Dietary issues and exercise activities discussed:     Goals Addressed   None   Depression Screen    10/15/2021    2:21 PM 12/05/2020    9:39 AM 11/14/2020    8:54 AM 06/11/2020    1:52 PM 03/09/2020    3:30 PM 11/04/2019    2:04 PM 09/01/2019    8:19 AM  PHQ 2/9 Scores  PHQ - 2 Score 0 0 0 0 0 0 0  PHQ- 9 Score 0 0 0 0       Fall Risk    10/15/2021    2:20 PM 12/05/2020    9:38 AM 11/14/2020    8:54 AM 06/11/2020    1:51 PM 03/09/2020    3:30 PM  [place] in the past year? 0 0 0 0 0  Number falls in past yr: 0 0 0 0   Injury with Fall? 0 0 0 0   Risk for fall due to : No Fall Risks No Fall Risks No Fall Risks No Fall Risks   Follow up Falls evaluation completed;Education provided Falls evaluation completed Falls evaluation completed Falls evaluation completed Falls evaluation completed    FALL RISK PREVENTION PERTAINING TO THE HOME:  Any stairs in or around the home? Yes  If so, are there any without handrails? Yes  Home free of loose throw rugs in  walkways, pet beds, electrical cords, etc? Yes  Adequate lighting in your home to reduce risk of falls? Yes   ASSISTIVE  DEVICES UTILIZED TO PREVENT FALLS:  Life alert? No  Use of a cane, walker or w/c? No  Grab bars in the bathroom? No  Shower chair or bench in shower? No  Elevated toilet seat or a handicapped toilet? No   TIMED UP AND GO:  Was the test performed? Yes .  Length of time to ambulate 10 feet: 10 sec.   Gait steady and fast with assistive device  Cognitive Function: wnl's        11/15/2021   10:26 AM 08/17/2018    1:19 PM  6CIT Screen  What Year? 0 points 0 points  What month? 0 points 0 points  What time? 0 points 0 points  Count back from 20 0 points 0 points  Months in reverse 4 points 0 points  Repeat phrase 0 points 0 points  Total Score 4 points 0 points    Immunizations Immunization History  Administered Date(s) Administered   Influenza Inj Mdck Quad Pf 10/12/2016, 10/25/2017   Influenza,inj,Quad PF,6+ Mos 10/23/2014, 09/23/2018   Influenza-Unspecified 11/13/2015, 10/12/2016, 10/25/2017, 10/27/2019   Janssen (J&J) SARS-COV-2 Vaccination 05/04/2019   Tdap 09/30/2006, 11/25/2016    TDAP status: Up to date  Flu Vaccine status: Up to date  Pneumococcal vaccine status: Due, Education has been provided regarding the importance of this vaccine. Advised may receive this vaccine at local pharmacy or Health Dept. Aware to provide a copy of the vaccination record if obtained from local pharmacy or Health Dept. Verbalized acceptance and understanding.  Covid-19 vaccine status: Completed vaccines  Qualifies for Shingles Vaccine? Yes   Zostavax completed No   Shingrix Completed?: No.    Education has been provided regarding the importance of this vaccine. Patient has been advised to call insurance company to determine out of pocket expense if they have not yet received this vaccine. Advised may also receive vaccine at local pharmacy or Health Dept. Verbalized acceptance and understanding.  Screening Tests Health Maintenance  Topic Date Due   Zoster Vaccines- Shingrix (1 of  2) Never done   COVID-19 Vaccine (2 - Booster for Janssen series) 06/29/2019   Pneumonia Vaccine 60+ Years old (1 - PCV) Never done   INFLUENZA VACCINE  08/27/2021   TETANUS/TDAP  11/26/2026   COLONOSCOPY (Pts 45-67yr Insurance coverage will need to be confirmed)  04/02/2028   Hepatitis C Screening  Completed   HPV VACCINES  Aged Out    Health Maintenance  Health Maintenance Due  Topic Date Due   Zoster Vaccines- Shingrix (1 of 2) Never done   COVID-19 Vaccine (2 - Booster for Janssen series) 06/29/2019   Pneumonia Vaccine 66 Years old (1 - PCV) Never done   INFLUENZA VACCINE  08/27/2021    Colorectal cancer screening: Type of screening: Colonoscopy. Completed 04/02/2021. Repeat every 7 years  Lung Cancer Screening: (Low Dose CT Chest recommended if Age 66-80years, 30 pack-year currently smoking OR have quit w/in 15years.) does not qualify.   Lung Cancer Screening Referral: n/a  Additional Screening:  Hepatitis C Screening: does qualify; Completed 05/16/2015  Vision Screening: Recommended annual ophthalmology exams for early detection of glaucoma and other disorders of the eye. Is the patient up to date with their annual eye exam?  No  Who is the provider or what is the name of the office in which the patient attends annual eye exams? N/a If  pt is not established with a provider, would they like to be referred to a provider to establish care? No .   Dental Screening: Recommended annual dental exams for proper oral hygiene  Community Resource Referral / Chronic Care Management: CRR required this visit?  No   CCM required this visit?  No      Plan:  -Will obtain routine fasting labs. -BP elevated, repeated with improvement. Continue current medication regimen. -Discussed to follow-up with opthalmology for routine eye exam. -Follow-up in 4 months for reg OV- HTN, HLD, prediabetes  I have personally reviewed and noted the following in the patient's chart:   Medical  and social history Use of alcohol, tobacco or illicit drugs  Current medications and supplements including opioid prescriptions. Patient is not currently taking opioid prescriptions. Functional ability and status Nutritional status Physical activity Advanced directives List of other physicians Hospitalizations, surgeries, and ER visits in previous 12 months Vitals Screenings to include cognitive, depression, and falls Referrals and appointments  In addition, I have reviewed and discussed with patient certain preventive protocols, quality metrics, and best practice recommendations. A written personalized care plan for preventive services as well as general preventive health recommendations were provided to patient.

## 2021-11-16 LAB — LIPID PANEL
Chol/HDL Ratio: 2.9 ratio (ref 0.0–5.0)
Cholesterol, Total: 203 mg/dL — ABNORMAL HIGH (ref 100–199)
HDL: 71 mg/dL (ref >39)
LDL Chol Calc (NIH): 119 mg/dL — ABNORMAL HIGH (ref 0–99)
Triglycerides: 72 mg/dL (ref 0–149)
VLDL Cholesterol Cal: 13 mg/dL (ref 5–40)

## 2021-11-16 LAB — HEMOGLOBIN A1C
Est. average glucose Bld gHb Est-mCnc: 131 mg/dL
Hgb A1c MFr Bld: 6.2 % — ABNORMAL HIGH (ref 4.8–5.6)

## 2021-11-16 LAB — CBC WITH DIFFERENTIAL/PLATELET
Basophils Absolute: 0.1 10*3/uL (ref 0.0–0.2)
Basos: 1 %
EOS (ABSOLUTE): 0.1 10*3/uL (ref 0.0–0.4)
Eos: 2 %
Hematocrit: 43.7 % (ref 37.5–51.0)
Hemoglobin: 14.8 g/dL (ref 13.0–17.7)
Immature Grans (Abs): 0 10*3/uL (ref 0.0–0.1)
Immature Granulocytes: 0 %
Lymphocytes Absolute: 2.6 10*3/uL (ref 0.7–3.1)
Lymphs: 32 %
MCH: 31.2 pg (ref 26.6–33.0)
MCHC: 33.9 g/dL (ref 31.5–35.7)
MCV: 92 fL (ref 79–97)
Monocytes Absolute: 0.9 10*3/uL (ref 0.1–0.9)
Monocytes: 11 %
Neutrophils Absolute: 4.5 10*3/uL (ref 1.4–7.0)
Neutrophils: 54 %
Platelets: 269 10*3/uL (ref 150–450)
RBC: 4.74 x10E6/uL (ref 4.14–5.80)
RDW: 12.9 % (ref 11.6–15.4)
WBC: 8.2 10*3/uL (ref 3.4–10.8)

## 2021-11-16 LAB — COMPREHENSIVE METABOLIC PANEL
ALT: 34 IU/L (ref 0–44)
AST: 28 IU/L (ref 0–40)
Albumin/Globulin Ratio: 1.8 (ref 1.2–2.2)
Albumin: 4.8 g/dL (ref 3.9–4.9)
Alkaline Phosphatase: 55 IU/L (ref 44–121)
BUN/Creatinine Ratio: 16 (ref 10–24)
BUN: 11 mg/dL (ref 8–27)
Bilirubin Total: 0.5 mg/dL (ref 0.0–1.2)
CO2: 25 mmol/L (ref 20–29)
Calcium: 9.3 mg/dL (ref 8.6–10.2)
Chloride: 89 mmol/L — ABNORMAL LOW (ref 96–106)
Creatinine, Ser: 0.68 mg/dL — ABNORMAL LOW (ref 0.76–1.27)
Globulin, Total: 2.7 g/dL (ref 1.5–4.5)
Glucose: 110 mg/dL — ABNORMAL HIGH (ref 70–99)
Potassium: 4.6 mmol/L (ref 3.5–5.2)
Sodium: 130 mmol/L — ABNORMAL LOW (ref 134–144)
Total Protein: 7.5 g/dL (ref 6.0–8.5)
eGFR: 103 mL/min/{1.73_m2} (ref >59)

## 2021-11-16 LAB — TSH: TSH: 1.46 u[IU]/mL (ref 0.450–4.500)

## 2021-11-19 ENCOUNTER — Other Ambulatory Visit: Payer: Self-pay | Admitting: Physician Assistant

## 2021-11-19 DIAGNOSIS — E785 Hyperlipidemia, unspecified: Secondary | ICD-10-CM

## 2021-11-19 MED ORDER — ROSUVASTATIN CALCIUM 40 MG PO TABS: 40.0000 mg | ORAL_TABLET | Freq: Every day | ORAL | 1 refills | Status: DC

## 2021-12-09 ENCOUNTER — Telehealth: Payer: Self-pay

## 2021-12-09 NOTE — Telephone Encounter (Signed)
I showed Samuel Macias the written BP readings pt dropped off this morning.  Pt was advised to continue the same regimen for BP and to keep a log of BP readings one week prior 03/10/22-03/18/21.  FYI

## 2021-12-24 ENCOUNTER — Other Ambulatory Visit: Payer: Self-pay

## 2021-12-24 DIAGNOSIS — E785 Hyperlipidemia, unspecified: Secondary | ICD-10-CM

## 2021-12-30 ENCOUNTER — Other Ambulatory Visit: Payer: Medicare Other

## 2021-12-30 DIAGNOSIS — E785 Hyperlipidemia, unspecified: Secondary | ICD-10-CM

## 2021-12-31 LAB — LIPID PANEL
Chol/HDL Ratio: 3.8 ratio (ref 0.0–5.0)
Cholesterol, Total: 210 mg/dL — ABNORMAL HIGH (ref 100–199)
HDL: 55 mg/dL (ref >39)
LDL Chol Calc (NIH): 141 mg/dL — ABNORMAL HIGH (ref 0–99)
Triglycerides: 76 mg/dL (ref 0–149)
VLDL Cholesterol Cal: 14 mg/dL (ref 5–40)

## 2021-12-31 LAB — COMPREHENSIVE METABOLIC PANEL
ALT: 30 IU/L (ref 0–44)
AST: 22 IU/L (ref 0–40)
Albumin/Globulin Ratio: 1.8 (ref 1.2–2.2)
Albumin: 4.5 g/dL (ref 3.9–4.9)
Alkaline Phosphatase: 48 IU/L (ref 44–121)
BUN/Creatinine Ratio: 13 (ref 10–24)
BUN: 10 mg/dL (ref 8–27)
Bilirubin Total: 0.5 mg/dL (ref 0.0–1.2)
CO2: 23 mmol/L (ref 20–29)
Calcium: 9.6 mg/dL (ref 8.6–10.2)
Chloride: 95 mmol/L — ABNORMAL LOW (ref 96–106)
Creatinine, Ser: 0.77 mg/dL (ref 0.76–1.27)
Globulin, Total: 2.5 g/dL (ref 1.5–4.5)
Glucose: 120 mg/dL — ABNORMAL HIGH (ref 70–99)
Potassium: 3.8 mmol/L (ref 3.5–5.2)
Sodium: 136 mmol/L (ref 134–144)
Total Protein: 7 g/dL (ref 6.0–8.5)
eGFR: 99 mL/min/{1.73_m2} (ref >59)

## 2021-12-31 LAB — HEPATIC FUNCTION PANEL: Bilirubin, Direct: 0.14 mg/dL (ref 0.00–0.40)

## 2021-12-31 NOTE — Progress Notes (Signed)
Please let the patient know that labs are back. Cholesterol is just a little elevated. Recommend he limit intake of fried and fatty foods. He should increase intake of lean proteins and green leafy vegetables. Adding exercise into daily routine will also be beneficial.  Liver functions and renal functions were good. No changes in medication.  Thanks so much.   -HB

## 2022-02-26 ENCOUNTER — Other Ambulatory Visit: Payer: Self-pay

## 2022-02-26 DIAGNOSIS — E785 Hyperlipidemia, unspecified: Secondary | ICD-10-CM

## 2022-02-26 MED ORDER — ROSUVASTATIN CALCIUM 40 MG PO TABS: 40.0000 mg | ORAL_TABLET | Freq: Every day | ORAL | 0 refills | Status: DC

## 2022-02-28 ENCOUNTER — Other Ambulatory Visit: Payer: Self-pay | Admitting: *Deleted

## 2022-02-28 DIAGNOSIS — I10 Essential (primary) hypertension: Secondary | ICD-10-CM

## 2022-02-28 MED ORDER — LOSARTAN POTASSIUM 100 MG PO TABS: 100.0000 mg | ORAL_TABLET | Freq: Every day | ORAL | 1 refills | Status: DC

## 2022-03-17 NOTE — Progress Notes (Signed)
Established patient visit   Patient: Samuel Macias   DOB: 07-13-1955   67 y.o. Male  MRN: [id number] Visit Date: 03/18/2022   Chief Complaint  Patient presents with   Follow-up   Subjective    HPI  Follow up  -hypertension  --generally well controlled. -hyperlipidemia  --mild elevation of LDL and total cholesterol  --glucose 120 with most recent Hgba1c 11/15/2021 - 6.2  ---today, HgbA1c is 6.4  -currently takes celexa 10 mg every evening.  -wife has told him that he gets too "excited" and irritable. She suggested he increase his celexa to 40 mg.  -the patient declines excess irritability. Does not yell or cuss at anyone. States that he sleeps well.  -he denies other concerns or complaints.  --He denies chest pain, chest pressure, or shortness of breath. He denies headaches or visual disturbances. He denies abdominal pain, nausea, vomiting, or changes in bowel or bladder habits.      Medications: Outpatient Medications Prior to Visit  Medication Sig   aspirin 81 MG tablet Take 81 mg by mouth daily.   CLEAR EYES REDNESS RELIEF 0.012-0.25 % SOLN SMARTSIG:1 Drop(s) In Eye(s) PRN   Coenzyme Q10 (COQ-10) 30 MG CAPS Take 20 mLs by mouth.   losartan (COZAAR) 100 MG tablet Take 1 tablet (100 mg total) by mouth daily.   Omega-3 Fatty Acids (FISH OIL) 1200 MG CAPS Take 1 capsule by mouth daily.   sildenafil (REVATIO) 20 MG tablet Take 1-3 tablets (20-60 mg total) by mouth daily as needed.   [DISCONTINUED] amLODipine (NORVASC) 10 MG tablet Take 1 tablet (10 mg total) by mouth daily.   [DISCONTINUED] citalopram (CELEXA) 10 MG tablet Take 1 tablet (10 mg total) by mouth daily.   [DISCONTINUED] hydrochlorothiazide (MICROZIDE) 12.5 MG capsule Take 1 capsule (12.5 mg total) by mouth daily.   [DISCONTINUED] metoprolol succinate (TOPROL-XL) 50 MG 24 hr tablet Take with or immediately following a meal.   [DISCONTINUED] rosuvastatin (CRESTOR) 40 MG tablet Take 1 tablet (40 mg total) by mouth  daily.   No facility-administered medications prior to visit.    Review of Systems See HPI     Last CBC Lab Results  Component Value Date   WBC 8.2 11/15/2021   HGB 14.8 11/15/2021   HCT 43.7 11/15/2021   MCV 92 11/15/2021   MCH 31.2 11/15/2021   RDW 12.9 11/15/2021   PLT 269 94/70/9628   Last metabolic panel Lab Results  Component Value Date   GLUCOSE 120 (H) 12/30/2021   NA 136 12/30/2021   K 3.8 12/30/2021   CL 95 (L) 12/30/2021   CO2 23 12/30/2021   BUN 10 12/30/2021   CREATININE 0.77 12/30/2021   EGFR 99 12/30/2021   CALCIUM 9.6 12/30/2021   PROT 7.0 12/30/2021   ALBUMIN 4.5 12/30/2021   LABGLOB 2.5 12/30/2021   AGRATIO 1.8 12/30/2021   BILITOT 0.5 12/30/2021   ALKPHOS 48 12/30/2021   AST 22 12/30/2021   ALT 30 12/30/2021   ANIONGAP 15 05/13/2017   Last lipids Lab Results  Component Value Date   CHOL 210 (H) 12/30/2021   HDL 55 12/30/2021   LDLCALC 141 (H) 12/30/2021   TRIG 76 12/30/2021   CHOLHDL 3.8 12/30/2021   Last hemoglobin A1c Lab Results  Component Value Date   HGBA1C 6.4 03/18/2022   Last thyroid functions Lab Results  Component Value Date   TSH 1.460 11/15/2021        Objective     Today's Vitals  03/18/22 0841  BP: 135/63  Pulse: (Abnormal) 51  SpO2: 100%  Weight: 169 lb 12.8 oz (77 kg)  Height: 5\' 6"  (1.676 m)   Body mass index is 27.41 kg/m.  BP Readings from Last 3 Encounters:  03/18/22 135/63  11/15/21 (Abnormal) 148/87  10/15/21 121/71    Wt Readings from Last 3 Encounters:  03/18/22 169 lb 12.8 oz (77 kg)  11/15/21 177 lb (80.3 kg)  10/15/21 177 lb (80.3 kg)    Physical Exam Vitals and nursing note reviewed.  Constitutional:      Appearance: Normal appearance. He is well-developed.  HENT:     Head: Normocephalic and atraumatic.     Nose: Nose normal.     Mouth/Throat:     Mouth: Mucous membranes are moist.     Pharynx: Oropharynx is clear.  Eyes:     Extraocular Movements: Extraocular movements  intact.     Conjunctiva/sclera: Conjunctivae normal.     Pupils: Pupils are equal, round, and reactive to light.  Cardiovascular:     Rate and Rhythm: Normal rate and regular rhythm.     Pulses: Normal pulses.     Heart sounds: Normal heart sounds.  Pulmonary:     Effort: Pulmonary effort is normal.     Breath sounds: Normal breath sounds.  Abdominal:     Palpations: Abdomen is soft.  Musculoskeletal:        General: Normal range of motion.     Cervical back: Normal range of motion and neck supple.  Lymphadenopathy:     Cervical: No cervical adenopathy.  Skin:    General: Skin is warm and dry.     Capillary Refill: Capillary refill takes less than 2 seconds.  Neurological:     General: No focal deficit present.     Mental Status: He is alert and oriented to person, place, and time.  Psychiatric:        Mood and Affect: Mood normal.        Behavior: Behavior normal.        Thought Content: Thought content normal.        Judgment: Judgment normal.      Results for orders placed or performed in visit on 03/18/22  POCT glycosylated hemoglobin (Hb A1C)  Result Value Ref Range   Hemoglobin A1C     HbA1c POC (<> result, manual entry) 6.4 4.0 - 5.6 %   HbA1c, POC (prediabetic range)     HbA1c, POC (controlled diabetic range)      Assessment & Plan    1. Prediabetes HgbA1c 6.4 today. Advised him to limit carbohydrates and sugar and increase water intake daily. Recheck Hgba1c I 4 months.  - POCT glycosylated hemoglobin (Hb A1C)  2. Essential hypertension Stable. Continue bp medication as prescribed   3. Mixed hyperlipidemia Continue rosuvastatin as prescribed   4. Anxiety state Increase citalopram to 20 mg daily. Reassess at next visit.     Problem List Items Addressed This Visit       Cardiovascular and Mediastinum   Essential hypertension     Other   Hyperlipidemia   Prediabetes - Primary   Relevant Orders   POCT glycosylated hemoglobin (Hb A1C) (Completed)    Anxiety state     Return in about 4 months (around 07/17/2022) for prediabetes , check HgbA1c. increased celexa - can stay wit hme if possible. Marland Kitchen         Ronnell Freshwater, NP  [facility]  [address] [phone] (phone) [phone] (fax)  [place]

## 2022-03-18 ENCOUNTER — Ambulatory Visit (INDEPENDENT_AMBULATORY_CARE_PROVIDER_SITE_OTHER): Payer: Medicare Other | Admitting: Nurse Practitioner

## 2022-03-18 ENCOUNTER — Encounter: Payer: Self-pay | Admitting: Nurse Practitioner

## 2022-03-18 VITALS — BP 135/63 | HR 51 | Ht 66.0 in | Wt 169.8 lb

## 2022-03-18 DIAGNOSIS — E782 Mixed hyperlipidemia: Secondary | ICD-10-CM | POA: Diagnosis not present

## 2022-03-18 DIAGNOSIS — R7303 Prediabetes: Secondary | ICD-10-CM | POA: Diagnosis not present

## 2022-03-18 DIAGNOSIS — F411 Generalized anxiety disorder: Secondary | ICD-10-CM | POA: Diagnosis not present

## 2022-03-18 DIAGNOSIS — I10 Essential (primary) hypertension: Secondary | ICD-10-CM | POA: Diagnosis not present

## 2022-03-18 LAB — POCT GLYCOSYLATED HEMOGLOBIN (HGB A1C): HbA1c POC (<> result, manual entry): 6.4 % (ref 4.0–5.6)

## 2022-03-18 MED ORDER — CITALOPRAM HYDROBROMIDE 20 MG PO TABS: 20.0000 mg | ORAL_TABLET | Freq: Every day | ORAL | 1 refills | Status: DC

## 2022-03-19 ENCOUNTER — Other Ambulatory Visit: Payer: Self-pay

## 2022-03-19 DIAGNOSIS — E785 Hyperlipidemia, unspecified: Secondary | ICD-10-CM

## 2022-03-19 DIAGNOSIS — I10 Essential (primary) hypertension: Secondary | ICD-10-CM

## 2022-03-19 MED ORDER — AMLODIPINE BESYLATE 10 MG PO TABS: 10.0000 mg | ORAL_TABLET | Freq: Every day | ORAL | 1 refills | Status: DC

## 2022-03-19 MED ORDER — METOPROLOL SUCCINATE ER 50 MG PO TB24: ORAL_TABLET | ORAL | 1 refills | Status: DC

## 2022-03-19 MED ORDER — HYDROCHLOROTHIAZIDE 12.5 MG PO CAPS: 12.5000 mg | ORAL_CAPSULE | Freq: Every day | ORAL | 1 refills | Status: DC

## 2022-03-27 ENCOUNTER — Other Ambulatory Visit: Payer: Self-pay | Admitting: Nurse Practitioner

## 2022-03-27 DIAGNOSIS — I10 Essential (primary) hypertension: Secondary | ICD-10-CM

## 2022-04-01 MED ORDER — ROSUVASTATIN CALCIUM 40 MG PO TABS: 40.0000 mg | ORAL_TABLET | Freq: Every day | ORAL | 1 refills | Status: DC

## 2022-04-01 NOTE — Addendum Note (Signed)
Addended by: Gemma Payor on: 04/01/2022 11:47 AM   Modules accepted: Orders

## 2022-04-04 ENCOUNTER — Other Ambulatory Visit: Payer: Self-pay

## 2022-04-04 ENCOUNTER — Telehealth: Payer: Self-pay

## 2022-04-04 DIAGNOSIS — F411 Generalized anxiety disorder: Secondary | ICD-10-CM

## 2022-04-04 MED ORDER — CITALOPRAM HYDROBROMIDE 20 MG PO TABS: 20.0000 mg | ORAL_TABLET | Freq: Every day | ORAL | 0 refills | Status: DC

## 2022-04-04 NOTE — Telephone Encounter (Signed)
Rx was sent to [facility] for a 5 day supply

## 2022-04-04 NOTE — Telephone Encounter (Signed)
Pt is asking for a 5 day supply so that he will not run out of medication for: Citalopram (CELEXA) 20 MG tablet   Pharmacy: [id number] [place] [place]   Mail order should be delivered on Wednesday

## 2022-07-17 ENCOUNTER — Encounter: Payer: Self-pay | Admitting: Nurse Practitioner

## 2022-07-17 ENCOUNTER — Ambulatory Visit (INDEPENDENT_AMBULATORY_CARE_PROVIDER_SITE_OTHER): Payer: Medicare Other | Admitting: Nurse Practitioner

## 2022-07-17 VITALS — BP 132/74 | HR 55 | Ht 66.0 in | Wt 172.4 lb

## 2022-07-17 DIAGNOSIS — R7303 Prediabetes: Secondary | ICD-10-CM | POA: Diagnosis not present

## 2022-07-17 DIAGNOSIS — I10 Essential (primary) hypertension: Secondary | ICD-10-CM

## 2022-07-17 DIAGNOSIS — E782 Mixed hyperlipidemia: Secondary | ICD-10-CM | POA: Diagnosis not present

## 2022-07-17 DIAGNOSIS — E785 Hyperlipidemia, unspecified: Secondary | ICD-10-CM

## 2022-07-17 MED ORDER — METOPROLOL SUCCINATE ER 50 MG PO TB24: ORAL_TABLET | ORAL | 3 refills | Status: DC

## 2022-07-17 MED ORDER — ROSUVASTATIN CALCIUM 40 MG PO TABS
40.0000 mg | ORAL_TABLET | Freq: Every day | ORAL | 3 refills | Status: DC
Start: 2022-07-17 — End: 2023-07-21

## 2022-07-17 MED ORDER — HYDROCHLOROTHIAZIDE 12.5 MG PO CAPS: 12.5000 mg | ORAL_CAPSULE | Freq: Every day | ORAL | 3 refills | Status: DC

## 2022-07-17 MED ORDER — AMLODIPINE BESYLATE 10 MG PO TABS: 10.0000 mg | ORAL_TABLET | Freq: Every day | ORAL | 3 refills | Status: DC

## 2022-07-17 MED ORDER — LOSARTAN POTASSIUM 100 MG PO TABS: 100.0000 mg | ORAL_TABLET | Freq: Every day | ORAL | 3 refills | Status: DC

## 2022-07-17 NOTE — Assessment & Plan Note (Signed)
Check fasting lipids today along with CmP -adjust dosig of rosuvastatin as indicated

## 2022-07-17 NOTE — Assessment & Plan Note (Signed)
Check HgbA1c with today's labs.  -recommended he limit intake of carbohydrates and sugar. -he does participate in regular exercise.  -treat as indicated

## 2022-07-17 NOTE — Assessment & Plan Note (Signed)
-  Stable. Continue current medication regimen. Provided med refills. Will continue to monitor. Will collect CMP for medication monitoring. Discussed with patient peripheral edema is a potential side effect with amlodipine so recommend to monitor for worsening edema which should warrant medication adjustments. Pt verbalized understanding.

## 2022-07-17 NOTE — Progress Notes (Signed)
Established patient visit   Patient: Samuel Macias   DOB: Apr 13, 1955   67 y.o. Male  MRN: [id number] Visit Date: 07/17/2022   Chief Complaint  Patient presents with   Medical Management of Chronic Issues   Subjective    HPI  Follow up  prediabetes  -HgbA1c will be checked with routine labs today  GAD -increassed celexa to 20 mg daily  --he states that mood has improved. Much less irritable and sleeping well  -He denies chest pain, chest pressure, or shortness of breath. He denies headaches or visual disturbances. He denies abdominal pain, nausea, vomiting, or changes in bowel or bladder habits.     Medications: Outpatient Medications Prior to Visit  Medication Sig   citalopram (CELEXA) 20 MG tablet Take 1 tablet (20 mg total) by mouth daily.   CLEAR EYES REDNESS RELIEF 0.012-0.25 % SOLN SMARTSIG:1 Drop(s) In Eye(s) PRN   Coenzyme Q10 (COQ-10) 30 MG CAPS Take 20 mLs by mouth.   Omega-3 Fatty Acids (FISH OIL) 1200 MG CAPS Take 1 capsule by mouth daily.   sildenafil (REVATIO) 20 MG tablet Take 1-3 tablets (20-60 mg total) by mouth daily as needed.   [DISCONTINUED] amLODipine (NORVASC) 10 MG tablet Take 1 tablet (10 mg total) by mouth daily.   [DISCONTINUED] aspirin 81 MG tablet Take 81 mg by mouth daily.   [DISCONTINUED] hydrochlorothiazide (MICROZIDE) 12.5 MG capsule Take 1 capsule (12.5 mg total) by mouth daily.   [DISCONTINUED] losartan (COZAAR) 100 MG tablet Take 1 tablet (100 mg total) by mouth daily.   [DISCONTINUED] metoprolol succinate (TOPROL-XL) 50 MG 24 hr tablet Take with or immediately following a meal.   [DISCONTINUED] rosuvastatin (CRESTOR) 40 MG tablet Take 1 tablet (40 mg total) by mouth daily.   No facility-administered medications prior to visit.    Review of Systems See HPI     Last CBC Lab Results  Component Value Date   WBC 8.2 11/15/2021   HGB 14.8 11/15/2021   HCT 43.7 11/15/2021   MCV 92 11/15/2021   MCH 31.2 11/15/2021   RDW 12.9  11/15/2021   PLT 269 11/15/2021   Last metabolic panel Lab Results  Component Value Date   GLUCOSE 120 (H) 12/30/2021   NA 136 12/30/2021   K 3.8 12/30/2021   CL 95 (L) 12/30/2021   CO2 23 12/30/2021   BUN 10 12/30/2021   CREATININE 0.77 12/30/2021   EGFR 99 12/30/2021   CALCIUM 9.6 12/30/2021   PROT 7.0 12/30/2021   ALBUMIN 4.5 12/30/2021   LABGLOB 2.5 12/30/2021   AGRATIO 1.8 12/30/2021   BILITOT 0.5 12/30/2021   ALKPHOS 48 12/30/2021   AST 22 12/30/2021   ALT 30 12/30/2021   ANIONGAP 15 05/13/2017   Last lipids Lab Results  Component Value Date   CHOL 210 (H) 12/30/2021   HDL 55 12/30/2021   LDLCALC 141 (H) 12/30/2021   TRIG 76 12/30/2021   CHOLHDL 3.8 12/30/2021   Last hemoglobin A1c Lab Results  Component Value Date   HGBA1C 6.4 03/18/2022   Last thyroid functions Lab Results  Component Value Date   TSH 1.460 11/15/2021       Objective     Today's Vitals   07/17/22 0827  BP: 132/74  Pulse: (Abnormal) 55  SpO2: 98%  Weight: 172 lb 6.4 oz (78.2 kg)  Height: 5\' 6"  (1.676 m)   Body mass index is 27.83 kg/m.  BP Readings from Last 3 Encounters:  07/17/22 132/74  03/18/22 135/63  11/15/21 (  Abnormal) 148/87    Wt Readings from Last 3 Encounters:  07/17/22 172 lb 6.4 oz (78.2 kg)  03/18/22 169 lb 12.8 oz (77 kg)  11/15/21 177 lb (80.3 kg)    Physical Exam Vitals and nursing note reviewed.  Constitutional:      Appearance: Normal appearance. He is well-developed.  HENT:     Head: Normocephalic and atraumatic.     Nose: Nose normal.     Mouth/Throat:     Mouth: Mucous membranes are moist.     Pharynx: Oropharynx is clear.  Eyes:     Extraocular Movements: Extraocular movements intact.     Conjunctiva/sclera: Conjunctivae normal.     Pupils: Pupils are equal, round, and reactive to light.  Neck:     Vascular: No carotid bruit.  Cardiovascular:     Rate and Rhythm: Normal rate and regular rhythm.     Pulses: Normal pulses.     Heart  sounds: Normal heart sounds.  Pulmonary:     Effort: Pulmonary effort is normal.     Breath sounds: Normal breath sounds.  Abdominal:     Palpations: Abdomen is soft.  Musculoskeletal:        General: Normal range of motion.     Cervical back: Normal range of motion and neck supple.  Lymphadenopathy:     Cervical: No cervical adenopathy.  Skin:    General: Skin is warm and dry.     Capillary Refill: Capillary refill takes less than 2 seconds.  Neurological:     General: No focal deficit present.     Mental Status: He is alert and oriented to person, place, and time.  Psychiatric:        Mood and Affect: Mood normal.        Behavior: Behavior normal.        Thought Content: Thought content normal.        Judgment: Judgment normal.      Assessment & Plan    Essential hypertension Assessment & Plan: -Stable. Continue current medication regimen. Provided med refills. Will continue to monitor. Will collect CMP for medication monitoring. Discussed with patient peripheral edema is a potential side effect with amlodipine so recommend to monitor for worsening edema which should warrant medication adjustments. Pt verbalized understanding.  Orders: -     amLODIPine Besylate; Take 1 tablet (10 mg total) by mouth daily.  Dispense: 90 tablet; Refill: 3 -     hydroCHLOROthiazide; Take 1 capsule (12.5 mg total) by mouth daily.  Dispense: 90 capsule; Refill: 3 -     Losartan Potassium; Take 1 tablet (100 mg total) by mouth daily.  Dispense: 90 tablet; Refill: 3 -     Metoprolol Succinate ER; Take with or immediately following a meal.  Dispense: 90 tablet; Refill: 3 -     Comprehensive metabolic panel; Future -     CBC; Future  Prediabetes Assessment & Plan: Check HgbA1c with today's labs.  -recommended he limit intake of carbohydrates and sugar. -he does participate in regular exercise.  -treat as indicated   Orders: -     Hemoglobin A1c; Future  Mixed hyperlipidemia Assessment &  Plan: Check fasting lipids today along with CmP -adjust dosig of rosuvastatin as indicated   Orders: -     CBC; Future -     Lipid panel; Future  Hyperlipidemia, unspecified hyperlipidemia type Assessment & Plan: Check fasting lipids today along with CmP -adjust dosig of rosuvastatin as indicated   Orders: -  Rosuvastatin Calcium; Take 1 tablet (40 mg total) by mouth daily.  Dispense: 90 tablet; Refill: 3     Return in about 4 months (around 11/16/2022) for blood pressure, check HgbA1c. needs MWV soon .         Carlean Jews, NP  [facility] at [facility] [phone] (phone) [phone] (fax)  [facility]

## 2022-07-18 LAB — COMPREHENSIVE METABOLIC PANEL
ALT: 30 IU/L (ref 0–44)
AST: 27 IU/L (ref 0–40)
Albumin: 4.7 g/dL (ref 3.9–4.9)
Alkaline Phosphatase: 60 IU/L (ref 44–121)
BUN/Creatinine Ratio: 18 (ref 10–24)
BUN: 13 mg/dL (ref 8–27)
Bilirubin Total: 0.7 mg/dL (ref 0.0–1.2)
CO2: 23 mmol/L (ref 20–29)
Calcium: 9.8 mg/dL (ref 8.6–10.2)
Chloride: 93 mmol/L — ABNORMAL LOW (ref 96–106)
Creatinine, Ser: 0.71 mg/dL — ABNORMAL LOW (ref 0.76–1.27)
Globulin, Total: 2.6 g/dL (ref 1.5–4.5)
Glucose: 126 mg/dL — ABNORMAL HIGH (ref 70–99)
Potassium: 4.1 mmol/L (ref 3.5–5.2)
Sodium: 135 mmol/L (ref 134–144)
Total Protein: 7.3 g/dL (ref 6.0–8.5)
eGFR: 101 mL/min/{1.73_m2} (ref >59)

## 2022-07-18 LAB — HEMOGLOBIN A1C
Est. average glucose Bld gHb Est-mCnc: 126 mg/dL
Hgb A1c MFr Bld: 6 % — ABNORMAL HIGH (ref 4.8–5.6)

## 2022-07-18 LAB — LIPID PANEL
Chol/HDL Ratio: 2.8 ratio (ref 0.0–5.0)
Cholesterol, Total: 182 mg/dL (ref 100–199)
HDL: 65 mg/dL (ref >39)
LDL Chol Calc (NIH): 103 mg/dL — ABNORMAL HIGH (ref 0–99)
Triglycerides: 74 mg/dL (ref 0–149)
VLDL Cholesterol Cal: 14 mg/dL (ref 5–40)

## 2022-07-18 LAB — CBC
Hematocrit: 43.2 % (ref 37.5–51.0)
Hemoglobin: 14.5 g/dL (ref 13.0–17.7)
MCH: 30.7 pg (ref 26.6–33.0)
MCHC: 33.6 g/dL (ref 31.5–35.7)
MCV: 92 fL (ref 79–97)
Platelets: 277 10*3/uL (ref 150–450)
RBC: 4.72 x10E6/uL (ref 4.14–5.80)
RDW: 12.4 % (ref 11.6–15.4)
WBC: 6.3 10*3/uL (ref 3.4–10.8)

## 2022-08-11 ENCOUNTER — Other Ambulatory Visit: Payer: Self-pay | Admitting: Nurse Practitioner

## 2022-08-11 DIAGNOSIS — F411 Generalized anxiety disorder: Secondary | ICD-10-CM

## 2022-09-11 ENCOUNTER — Other Ambulatory Visit: Payer: Self-pay | Admitting: Family Medicine

## 2022-09-11 DIAGNOSIS — I10 Essential (primary) hypertension: Secondary | ICD-10-CM

## 2022-09-11 MED ORDER — METOPROLOL SUCCINATE ER 50 MG PO TB24
50.0000 mg | ORAL_TABLET | Freq: Every day | ORAL | 3 refills | Status: DC
Start: 2022-09-11 — End: 2023-07-20

## 2022-11-18 ENCOUNTER — Ambulatory Visit (INDEPENDENT_AMBULATORY_CARE_PROVIDER_SITE_OTHER): Payer: Medicare Other | Admitting: Family Medicine

## 2022-11-18 ENCOUNTER — Encounter: Payer: Self-pay | Admitting: Family Medicine

## 2022-11-18 VITALS — BP 150/67 | Ht 66.0 in | Wt 174.1 lb

## 2022-11-18 DIAGNOSIS — F411 Generalized anxiety disorder: Secondary | ICD-10-CM | POA: Diagnosis not present

## 2022-11-18 DIAGNOSIS — R7303 Prediabetes: Secondary | ICD-10-CM

## 2022-11-18 DIAGNOSIS — E782 Mixed hyperlipidemia: Secondary | ICD-10-CM

## 2022-11-18 DIAGNOSIS — I10 Essential (primary) hypertension: Secondary | ICD-10-CM | POA: Diagnosis not present

## 2022-11-18 NOTE — Assessment & Plan Note (Signed)
Stable.  Recheck in 6 months.  That will be roughly 10 months since the previous check.  Exercises regularly.  Encouraged patient to continue limiting carbohydrate intake.

## 2022-11-18 NOTE — Patient Instructions (Addendum)
It was nice to see you today,  We addressed the following topics today: Your blood pressure was a little bit high.  I would like you to check your blood pressure once a day for at least the next 7 days.  Document the date, time, and blood pressure readings and let us know the results.  You can do this by calling us and relaying the results to [place] so that we can document them.  At that time we can decide if blood pressure medication changes need to be made. - We discussed your previous lab visit results.  Your A1c was stable so we will wait to check it until 6 months from now when we see him again.  Your cholesterol was around 100 for the LDL.  We will not change your cholesterol medicine and recheck your cholesterol levels when we see you again in 6 months. - Continue to limit your carbohydrate intake and exercise regularly.  Have a great day,  Frederic Jericho, MD

## 2022-11-18 NOTE — Assessment & Plan Note (Signed)
Continue citalopram.  Tolerating well.

## 2022-11-18 NOTE — Assessment & Plan Note (Signed)
Cholesterol is roughly at goal with LDL approximately 100.  Taking max to dose of Crestor for primary prevention.  Tolerating it well.  Continue current medication.  Recheck at next visit in 6 months.

## 2022-11-18 NOTE — Progress Notes (Unsigned)
Established Patient Office Visit  Subjective   Patient ID: Samuel Macias, male    DOB: 30-Jul-1955  Age: 67 y.o. MRN: [id number]  Chief Complaint  Patient presents with   Medical Management of Chronic Issues    HPI  Hypertension-patient takes his amlodipine, hydrochlorothiazide, losartan, metoprolol.  Occasionally checks his blood pressure at home but not recently.  Patient has checked his blood pressure at home in the past but felt like it was a hassle.  His wife help him with that.  Prediabetes-we discussed the patient's A1c, how it has been stable.  Discussed limiting carbohydrate intake.  Patient exercises regularly.  His daughter is a Psychologist, educational at the [employer].    The 10-year ASCVD risk score (Arnett DK, et al., 2019) is: 18.3%  Health Maintenance Due  Topic Date Due   Zoster Vaccines- Shingrix (1 of 2) Never done   Pneumonia Vaccine 10+ Years old (1 of 1 - PCV) Never done   COVID-19 Vaccine (2 - 2023-24 season) 09/28/2022   Medicare Annual Wellness (AWV)  11/16/2022      Objective:     BP (!) 150/67   Ht 5\' 6"  (1.676 m)   Wt 174 lb 1.9 oz (79 kg)   SpO2 99%   BMI 28.10 kg/m  {Vitals History (Optional):23777}  Physical Exam General: Alert, oriented Pulmonary: Psych: Pleasant affect   No results found for any visits on 11/18/22.      Assessment & Plan:   Prediabetes Assessment & Plan: Stable.  Recheck in 6 months.  That will be roughly 10 months since the previous check.  Exercises regularly.  Encouraged patient to continue limiting carbohydrate intake.   Mixed hyperlipidemia Assessment & Plan: Cholesterol is roughly at goal with LDL approximately 100.  Taking max to dose of Crestor for primary prevention.  Tolerating it well.  Continue current medication.  Recheck at next visit in 6 months.   Essential hypertension Assessment & Plan: Patient's blood pressure was elevated.  Advised him to recheck it for a week at least once a day and send the  results back to [place].   Anxiety state Assessment & Plan: Continue citalopram.  Tolerating well.      Return in about 6 months (around 05/19/2023) for physical.    Sandre Kitty, MD

## 2022-11-18 NOTE — Assessment & Plan Note (Signed)
Patient's blood pressure was elevated.  Advised him to recheck it for a week at least once a day and send the results back to [place].

## 2022-12-02 ENCOUNTER — Ambulatory Visit: Payer: Medicare Other

## 2022-12-02 DIAGNOSIS — Z0000 Encounter for general adult medical examination without abnormal findings: Secondary | ICD-10-CM | POA: Diagnosis not present

## 2022-12-02 NOTE — Progress Notes (Signed)
Subjective:   Samuel Macias is a 67 y.o. male who presents for Medicare Annual/Subsequent preventive examination.  Visit Complete: Virtual I connected with  Sydnee Cabal on 12/02/22 by a audio enabled telemedicine application and verified that I am speaking with the correct person using two identifiers.  Patient Location: Home  Provider Location: Office/Clinic  I discussed the limitations of evaluation and management by telemedicine. The patient expressed understanding and agreed to proceed.  Vital Signs: Because this visit was a virtual/telehealth visit, some criteria may be missing or patient reported. Any vitals not documented were not able to be obtained and vitals that have been documented are patient reported.    Cardiac Risk Factors include: advanced age (>3men, >54 women);dyslipidemia;hypertension;male gender     Objective:    Today's Vitals   There is no height or weight on file to calculate BMI.     12/02/2022   10:07 AM 05/16/2015   10:56 AM 07/18/2013    3:56 PM  Advanced Directives  Does Patient Have a Medical Advance Directive? No Yes Patient has advance directive, copy not in chart  Type of Advance Directive  Living will Living will  Copy of Healthcare Power of Attorney in Chart?  No - copy requested     Current Medications (verified) Outpatient Encounter Medications as of 12/02/2022  Medication Sig   amLODipine (NORVASC) 10 MG tablet Take 1 tablet (10 mg total) by mouth daily.   citalopram (CELEXA) 20 MG tablet TAKE 1 TABLET BY MOUTH DAILY   CLEAR EYES REDNESS RELIEF 0.012-0.25 % SOLN SMARTSIG:1 Drop(s) In Eye(s) PRN   Coenzyme Q10 (COQ-10) 30 MG CAPS Take 20 mLs by mouth.   hydrochlorothiazide (MICROZIDE) 12.5 MG capsule Take 1 capsule (12.5 mg total) by mouth daily.   losartan (COZAAR) 100 MG tablet Take 1 tablet (100 mg total) by mouth daily.   metoprolol succinate (TOPROL-XL) 50 MG 24 hr tablet Take 1 tablet (50 mg total) by mouth daily.    Omega-3 Fatty Acids (FISH OIL) 1200 MG CAPS Take 1 capsule by mouth daily.   rosuvastatin (CRESTOR) 40 MG tablet Take 1 tablet (40 mg total) by mouth daily.   sildenafil (REVATIO) 20 MG tablet Take 1-3 tablets (20-60 mg total) by mouth daily as needed.   No facility-administered encounter medications on file as of 12/02/2022.    Allergies (verified) Patient has no known allergies.   History: Past Medical History:  Diagnosis Date   Cataract    removed both eyes   GERD (gastroesophageal reflux disease)    hx of LPR   Hyperlipidemia    on meds   Hypertension    on meds   Personal history of colonic polyps - adenomas 11/05/2006   Prediabetes    no meds   Past Surgical History:  Procedure Laterality Date   COLONOSCOPY     EXTERNAL EAR SURGERY     as child   pain block     Herniated disc repair   POLYPECTOMY     TA+   WRIST SURGERY     tendon   Family History  Problem Relation Age of Onset   COPD Mother    Crohn's disease Brother    Hyperlipidemia Brother    Colon cancer Neg Hx    Colon polyps Neg Hx    Esophageal cancer Neg Hx    Rectal cancer Neg Hx    Stomach cancer Neg Hx    Social History   Socioeconomic History   Marital  status: Married    Spouse name: [facility]   Number of children: 2   Years of education: 12th grade   Highest education level: Not on file  Occupational History   Occupation: Civil Service fast streamer for [employer]  Tobacco Use   Smoking status: Former    Current packs/day: 0.00    Average packs/day: 0.4 packs/day for 5.0 years (2.0 ttl pk-yrs)    Types: Cigarettes    Start date: 01/28/1976    Quit date: 01/27/1981    Years since quitting: 41.8   Smokeless tobacco: Former    Types: Chew    Quit date: 01/27/1981  Vaping Use   Vaping status: Never Used  Substance and Sexual Activity   Alcohol use: Not Currently    Alcohol/week: 1.0 standard drink of alcohol    Types: 1 Standard drinks or equivalent per week   Drug use: No   Sexual activity: Yes   Other Topics Concern   Not on file  Social History Narrative   Lives with his wife and son, Sharia Reeve. His daughter lives independently in [place], [place].   Education: [employer]   Exercise: Yes   Social Determinants of Health   Financial Resource Strain: Low Risk  (12/02/2022)   Overall Financial Resource Strain (CARDIA)    Difficulty of Paying Living Expenses: Not hard at all  Food Insecurity: No Food Insecurity (12/02/2022)   Hunger Vital Sign    Worried About Running Out of Food in the Last Year: Never true    Ran Out of Food in the Last Year: Never true  Transportation Needs: No Transportation Needs (12/02/2022)   PRAPARE - Administrator, Civil Service (Medical): No    Lack of Transportation (Non-Medical): No  Physical Activity: Sufficiently Active (12/02/2022)   Exercise Vital Sign    Days of Exercise per Week: 2 days    Minutes of Exercise per Session: 90 min  Stress: No Stress Concern Present (12/02/2022)   [employer] of Occupational Health - Occupational Stress Questionnaire    Feeling of Stress : Not at all  Social Connections: Moderately Isolated (12/02/2022)   Social Connection and Isolation Panel [NHANES]    Frequency of Communication with Friends and Family: Three times a week    Frequency of Social Gatherings with Friends and Family: More than three times a week    Attends Religious Services: Never    Database administrator or Organizations: No    Attends Engineer, structural: Never    Marital Status: Married    Tobacco Counseling Counseling given: Not Answered   Clinical Intake:  Pre-visit preparation completed: Yes  Pain : No/denies pain     Nutritional Risks: None Diabetes: No  How often do you need to have someone help you when you read instructions, pamphlets, or other written materials from your doctor or pharmacy?: 1 - Never  Interpreter Needed?: No  Information entered by :: NAllen LPN   Activities of Daily  Living    12/02/2022   10:02 AM  In your present state of health, do you have any difficulty performing the following activities:  Hearing? 0  Vision? 0  Difficulty concentrating or making decisions? 0  Walking or climbing stairs? 0  Dressing or bathing? 0  Doing errands, shopping? 0  Preparing Food and eating ? N  Using the Toilet? N  In the past six months, have you accidently leaked urine? N  Do you have problems with loss of bowel control? N  Managing your Medications? N  Managing your Finances? N  Housekeeping or managing your Housekeeping? N    Patient Care Team: Sandre Kitty, MD as PCP - General (Family Medicine)  Indicate any recent Medical Services you may have received from other than [facility] providers in the past year (date may be approximate).     Assessment:   This is a routine wellness examination for [place].  Hearing/Vision screen Hearing Screening - Comments:: Denies hearing issues Vision Screening - Comments:: Regular eye exams, [facility]   Goals Addressed             This Visit's Progress    Patient Stated       12/02/2022, continue exercise       Depression Screen    12/02/2022   10:10 AM 11/18/2022    9:24 AM 07/17/2022    8:29 AM 03/18/2022    8:43 AM 10/15/2021    2:21 PM 12/05/2020    9:39 AM 11/14/2020    8:54 AM  PHQ 2/9 Scores  PHQ - 2 Score 0  0  0 0 0  PHQ- 9 Score 0    0 0 0  Exception Documentation  Patient refusal  Patient refusal       Fall Risk    12/02/2022   10:08 AM 03/18/2022    8:43 AM 10/15/2021    2:20 PM 12/05/2020    9:38 AM 11/14/2020    8:54 AM  Fall Risk   Falls in the past year? 0 0 0 0 0  Number falls in past yr: 0 0 0 0 0  Injury with Fall? 0 0 0 0 0  Risk for fall due to : Medication side effect  No Fall Risks No Fall Risks No Fall Risks  Follow up Falls prevention discussed;Falls evaluation completed Falls evaluation completed Falls evaluation completed;Education provided Falls evaluation completed Falls  evaluation completed    MEDICARE RISK AT HOME: Medicare Risk at Home Any stairs in or around the home?: Yes If so, are there any without handrails?: No Home free of loose throw rugs in walkways, pet beds, electrical cords, etc?: Yes Adequate lighting in your home to reduce risk of falls?: Yes Life alert?: No Use of a cane, walker or w/c?: No Grab bars in the bathroom?: Yes Shower chair or bench in shower?: No Elevated toilet seat or a handicapped toilet?: No  TIMED UP AND GO:  Was the test performed?  No    Cognitive Function:        12/02/2022   10:17 AM 11/15/2021   10:26 AM 08/17/2018    1:19 PM  6CIT Screen  What Year? 0 points 0 points 0 points  What month? 3 points 0 points 0 points  What time? 0 points 0 points 0 points  Count back from 20 0 points 0 points 0 points  Months in reverse 4 points 4 points 0 points  Repeat phrase 0 points 0 points 0 points  Total Score 7 points 4 points 0 points    Immunizations Immunization History  Administered Date(s) Administered   Influenza Inj Mdck Quad Pf 10/12/2016, 10/25/2017   Influenza,inj,Quad PF,6+ Mos 10/23/2014, 09/23/2018   Influenza-Unspecified 11/13/2015, 10/12/2016, 10/25/2017, 10/27/2019, 10/16/2021   Janssen (J&J) SARS-COV-2 Vaccination 05/04/2019   Tdap 09/30/2006, 11/25/2016, 04/20/2022    TDAP status: Up to date  Flu Vaccine status: Due, Education has been provided regarding the importance of this vaccine. Advised may receive this vaccine at local pharmacy or Health Dept.  Aware to provide a copy of the vaccination record if obtained from local pharmacy or Health Dept. Verbalized acceptance and understanding.  Pneumococcal vaccine status: Due, Education has been provided regarding the importance of this vaccine. Advised may receive this vaccine at local pharmacy or Health Dept. Aware to provide a copy of the vaccination record if obtained from local pharmacy or Health Dept. Verbalized acceptance and  understanding.  Covid-19 vaccine status: Information provided on how to obtain vaccines.   Qualifies for Shingles Vaccine? Yes   Zostavax completed No   Shingrix Completed?: No.    Education has been provided regarding the importance of this vaccine. Patient has been advised to call insurance company to determine out of pocket expense if they have not yet received this vaccine. Advised may also receive vaccine at local pharmacy or Health Dept. Verbalized acceptance and understanding.  Screening Tests Health Maintenance  Topic Date Due   Zoster Vaccines- Shingrix (1 of 2) Never done   Pneumonia Vaccine 52+ Years old (1 of 1 - PCV) Never done   COVID-19 Vaccine (2 - 2023-24 season) 12/18/2022 (Originally 09/28/2022)   INFLUENZA VACCINE  04/27/2023 (Originally 08/28/2022)   Medicare Annual Wellness (AWV)  12/02/2023   Colonoscopy  04/02/2028   DTaP/Tdap/Td (4 - Td or Tdap) 04/19/2032   Hepatitis C Screening  Completed   HPV VACCINES  Aged Out    Health Maintenance  Health Maintenance Due  Topic Date Due   Zoster Vaccines- Shingrix (1 of 2) Never done   Pneumonia Vaccine 50+ Years old (1 of 1 - PCV) Never done    Colorectal cancer screening: Type of screening: Colonoscopy. Completed 04/02/2021. Repeat every 7 years  Lung Cancer Screening: (Low Dose CT Chest recommended if Age 63-80 years, 20 pack-year currently smoking OR have quit w/in 15years.) does not qualify.   Lung Cancer Screening Referral: no  Additional Screening:  Hepatitis C Screening: does qualify; Completed 05/16/2015  Vision Screening: Recommended annual ophthalmology exams for early detection of glaucoma and other disorders of the eye. Is the patient up to date with their annual eye exam?  No  Who is the provider or what is the name of the office in which the patient attends annual eye exams? none If pt is not established with a provider, would they like to be referred to a provider to establish care? No .   Dental  Screening: Recommended annual dental exams for proper oral hygiene  Diabetic Foot Exam: n/a  Community Resource Referral / Chronic Care Management: CRR required this visit?  No   CCM required this visit?  No     Plan:     I have personally reviewed and noted the following in the patient's chart:   Medical and social history Use of alcohol, tobacco or illicit drugs  Current medications and supplements including opioid prescriptions. Patient is not currently taking opioid prescriptions. Functional ability and status Nutritional status Physical activity Advanced directives List of other physicians Hospitalizations, surgeries, and ER visits in previous 12 months Vitals Screenings to include cognitive, depression, and falls Referrals and appointments  In addition, I have reviewed and discussed with patient certain preventive protocols, quality metrics, and best practice recommendations. A written personalized care plan for preventive services as well as general preventive health recommendations were provided to patient.     Barb Merino, LPN   05/01/4096   After Visit Summary: (MyChart) Due to this being a telephonic visit, the after visit summary with patients personalized plan was  offered to patient via MyChart   Nurse Notes: none

## 2022-12-02 NOTE — Patient Instructions (Signed)
[name] , Thank you for taking time to come for your Medicare Wellness Visit. I appreciate your ongoing commitment to your health goals. Please review the following plan we discussed and let me know if I can assist you in the future.   Referrals/Orders/Follow-Ups/Clinician Recommendations: none  This is a list of the screening recommended for you and due dates:  Health Maintenance  Topic Date Due   Zoster (Shingles) Vaccine (1 of 2) Never done   Pneumonia Vaccine (1 of 1 - PCV) Never done   COVID-19 Vaccine (2 - 2023-24 season) 12/18/2022*   Flu Shot  04/27/2023*   Medicare Annual Wellness Visit  12/02/2023   Colon Cancer Screening  04/02/2028   DTaP/Tdap/Td vaccine (4 - Td or Tdap) 04/19/2032   Hepatitis C Screening  Completed   HPV Vaccine  Aged Out  *Topic was postponed. The date shown is not the original due date.    Advanced directives: (ACP Link)Information on Advanced Care Planning can be found at [facility] Advance Health Care Directives Advance Health Care Directives ([email or web address]   Next Medicare Annual Wellness Visit scheduled for next year: No, schedule is not open for next year  insert Preventive Care attachment Insert FALL PREVENTION attachment if needed

## 2023-04-29 ENCOUNTER — Ambulatory Visit (INDEPENDENT_AMBULATORY_CARE_PROVIDER_SITE_OTHER): Payer: Medicare Other | Admitting: Family Medicine

## 2023-04-29 ENCOUNTER — Encounter: Payer: Self-pay | Admitting: Family Medicine

## 2023-04-29 VITALS — BP 133/65 | HR 61 | Ht 66.0 in | Wt 176.4 lb

## 2023-04-29 DIAGNOSIS — R4189 Other symptoms and signs involving cognitive functions and awareness: Secondary | ICD-10-CM | POA: Insufficient documentation

## 2023-04-29 DIAGNOSIS — R413 Other amnesia: Secondary | ICD-10-CM | POA: Diagnosis not present

## 2023-04-29 NOTE — Assessment & Plan Note (Addendum)
 Has been noticeable over the past year per the patient and his wife.  Patient reportedly failed a mini cog test when evaluated at home by representative from his insurance company.  Main concerns are being forgetful, misplacing objects, worsening driving skills/executive functioning. - Will get lab testing - Will order CT head - Will have patient follow-up with me for more thorough neurocognitive evaluation such as slums test - Will refer to neurology to discuss treatment options/additional workup

## 2023-04-29 NOTE — Progress Notes (Signed)
   Established Patient Office Visit  Subjective   Patient ID: KYL GIVLER, male    DOB: 04/04/1955  Age: 68 y.o. MRN: [id number]  Chief Complaint  Patient presents with   Medical Management of Chronic Issues    HPI Patient presents today with his wife out of concern for memory issues.  Had a insurance representative come to their house recently and they state that the patient failed to the memory test they were given.  Patient and wife agree that he is misplacing things and being more forgetful.  This has been worsening for the past year.  He currently does not drive out of concerns that he is not safe when driving.  Sometimes repeats questions.  No behavior changes.  No signs or symptoms of Parkinson's or other movement disorder.   The 10-year ASCVD risk score (Arnett DK, et al., 2019) is: 16.3%  Health Maintenance Due  Topic Date Due   Zoster Vaccines- Shingrix (1 of 2) Never done   Pneumonia Vaccine 30+ Years old (1 of 1 - PCV) Never done   COVID-19 Vaccine (2 - 2024-25 season) 09/28/2022      Objective:     BP 133/65   Pulse 61   Ht 5\' 6"  (1.676 m)   Wt 176 lb 6.4 oz (80 kg)   SpO2 97%   BMI 28.47 kg/m    Physical Exam General: Alert, oriented Pulmonary: No respiratory distress Psych: Pleasant.  Able to maintain normal conversation.  No results found for any visits on 04/29/23.      Assessment & Plan:   Memory loss Assessment & Plan: Has been noticeable over the past year per the patient and his wife.  Patient reportedly failed a mini cog test when evaluated at home by representative from his insurance company.  Main concerns are being forgetful, misplacing objects, worsening driving skills/executive functioning. - Will get lab testing - Will order CT head - Will have patient follow-up with me for more thorough neurocognitive evaluation such as slums test - Will refer to neurology to discuss treatment options/additional workup  Orders: -     TSH;  Future -     CBC with Differential/Platelet; Future -     Comprehensive metabolic panel with GFR; Future -     B12 and Folate Panel; Future -     Hemoglobin A1c; Future -     Lipid panel; Future -     RPR; Future -     Ambulatory referral to Neurology -     CT HEAD WO CONTRAST ( ); Future     Return in about 4 weeks (around 05/27/2023) for cognitive eval.    Sandre Kitty, MD

## 2023-04-29 NOTE — Patient Instructions (Signed)
 It was nice to see you today,  We addressed the following topics today: -I am ordering some lab tests that you will need to have done in our office. - I am ordering a CT scan of your head.  Someone will call you to schedule this. - I am sending in a neurology referral. - You can schedule an appointment with me in the next month for cognitive evaluation.  Have a great day,  Frederic Jericho, MD

## 2023-04-30 ENCOUNTER — Other Ambulatory Visit

## 2023-04-30 DIAGNOSIS — R413 Other amnesia: Secondary | ICD-10-CM

## 2023-05-01 ENCOUNTER — Encounter: Payer: Self-pay | Admitting: Family Medicine

## 2023-05-01 ENCOUNTER — Ambulatory Visit
Admission: RE | Admit: 2023-05-01 | Discharge: 2023-05-01 | Disposition: A | Discharge status: Home / Self Care | Source: Ambulatory Visit | Attending: Family Medicine | Admitting: Family Medicine

## 2023-05-01 DIAGNOSIS — R413 Other amnesia: Secondary | ICD-10-CM

## 2023-05-01 LAB — CBC WITH DIFFERENTIAL/PLATELET
Basophils Absolute: 0.1 10*3/uL (ref 0.0–0.2)
Basos: 1 %
EOS (ABSOLUTE): 0.2 10*3/uL (ref 0.0–0.4)
Eos: 2 %
Hematocrit: 44.8 % (ref 37.5–51.0)
Hemoglobin: 14.7 g/dL (ref 13.0–17.7)
Immature Grans (Abs): 0 10*3/uL (ref 0.0–0.1)
Immature Granulocytes: 0 %
Lymphocytes Absolute: 2.4 10*3/uL (ref 0.7–3.1)
Lymphs: 34 %
MCH: 30.2 pg (ref 26.6–33.0)
MCHC: 32.8 g/dL (ref 31.5–35.7)
MCV: 92 fL (ref 79–97)
Monocytes Absolute: 1 10*3/uL — ABNORMAL HIGH (ref 0.1–0.9)
Monocytes: 14 %
Neutrophils Absolute: 3.5 10*3/uL (ref 1.4–7.0)
Neutrophils: 49 %
Platelets: 259 10*3/uL (ref 150–450)
RBC: 4.86 x10E6/uL (ref 4.14–5.80)
RDW: 12.2 % (ref 11.6–15.4)
WBC: 7.1 10*3/uL (ref 3.4–10.8)

## 2023-05-01 LAB — COMPREHENSIVE METABOLIC PANEL WITH GFR
ALT: 22 IU/L (ref 0–44)
AST: 22 IU/L (ref 0–40)
Albumin: 4.4 g/dL (ref 3.9–4.9)
Alkaline Phosphatase: 50 IU/L (ref 44–121)
BUN/Creatinine Ratio: 14 (ref 10–24)
BUN: 11 mg/dL (ref 8–27)
Bilirubin Total: 0.6 mg/dL (ref 0.0–1.2)
CO2: 24 mmol/L (ref 20–29)
Calcium: 9.4 mg/dL (ref 8.6–10.2)
Chloride: 92 mmol/L — ABNORMAL LOW (ref 96–106)
Creatinine, Ser: 0.76 mg/dL (ref 0.76–1.27)
Globulin, Total: 2.4 g/dL (ref 1.5–4.5)
Glucose: 102 mg/dL — ABNORMAL HIGH (ref 70–99)
Potassium: 4 mmol/L (ref 3.5–5.2)
Sodium: 132 mmol/L — ABNORMAL LOW (ref 134–144)
Total Protein: 6.8 g/dL (ref 6.0–8.5)
eGFR: 98 mL/min/{1.73_m2} (ref >59)

## 2023-05-01 LAB — B12 AND FOLATE PANEL
Folate: 8.6 ng/mL (ref >3.0)
Vitamin B-12: 431 pg/mL (ref 232–1245)

## 2023-05-01 LAB — LIPID PANEL
Chol/HDL Ratio: 2.9 ratio (ref 0.0–5.0)
Cholesterol, Total: 158 mg/dL (ref 100–199)
HDL: 55 mg/dL (ref >39)
LDL Chol Calc (NIH): 84 mg/dL (ref 0–99)
Triglycerides: 104 mg/dL (ref 0–149)
VLDL Cholesterol Cal: 19 mg/dL (ref 5–40)

## 2023-05-01 LAB — HEMOGLOBIN A1C
Est. average glucose Bld gHb Est-mCnc: 140 mg/dL
Hgb A1c MFr Bld: 6.5 % — ABNORMAL HIGH (ref 4.8–5.6)

## 2023-05-01 LAB — TSH: TSH: 1.69 u[IU]/mL (ref 0.450–4.500)

## 2023-05-01 LAB — RPR: RPR Ser Ql: NONREACTIVE

## 2023-05-13 ENCOUNTER — Encounter: Payer: Self-pay | Admitting: Family Medicine

## 2023-05-14 ENCOUNTER — Other Ambulatory Visit: Payer: Medicare Other

## 2023-05-20 ENCOUNTER — Encounter: Payer: Medicare Other | Admitting: Family Medicine

## 2023-05-26 ENCOUNTER — Other Ambulatory Visit

## 2023-06-02 ENCOUNTER — Ambulatory Visit (INDEPENDENT_AMBULATORY_CARE_PROVIDER_SITE_OTHER): Admitting: Family Medicine

## 2023-06-02 ENCOUNTER — Encounter: Payer: Self-pay | Admitting: Family Medicine

## 2023-06-02 ENCOUNTER — Ambulatory Visit: Admitting: Family Medicine

## 2023-06-02 VITALS — BP 145/64 | HR 85 | Ht 65.75 in | Wt 175.0 lb

## 2023-06-02 DIAGNOSIS — R4189 Other symptoms and signs involving cognitive functions and awareness: Secondary | ICD-10-CM | POA: Diagnosis not present

## 2023-06-02 NOTE — Assessment & Plan Note (Addendum)
 Patient scored a 9/30 out of the slums test.  His results have been scanned and put in the media tab.  lab testing so far has been normal.  CT scan showed increased lateral ventricle space but this is likely due to atrophy and not normal pressure hydrocephalus.  Has a normal neurologic exam otherwise, no balance or gait abnormalities.  No history of incontinence.  Patient has a appointment with the neurologist in June.

## 2023-06-02 NOTE — Patient Instructions (Addendum)
 It was nice to see you today,  We addressed the following topics today: Your cognitive test was positive for cognitive impairment.  You scored a 9/30. - Your blood test were not concerning - Your CT scan of your brain showed some enlargement of your ventricles likely due to atrophy. - You can discuss the findings further with the neurologist and the results of your test, and what to do next regarding treatment and workup of your cognitive impairment.  Have a great day,  Etha Henle, MD

## 2023-06-02 NOTE — Progress Notes (Signed)
   Established Patient Office Visit  Subjective   Patient ID: Samuel Macias, male    DOB: 09/02/1955  Age: 68 y.o. MRN: [id number]  No chief complaint on file.   HPI  Subjective - Cognitive assessment performed -Patient has no questions or concerns. - After the test we discussed results with patient and his wife.  Also discussed the results of his CT scan and blood test. - Patient feels like his memory has been worsening.  He will get lost in crowded grocery stores like [employer].   The 10-year ASCVD risk score (Arnett DK, et al., 2019) is: 18.7%  Health Maintenance Due  Topic Date Due   Pneumonia Vaccine 55+ Years old (1 of 1 - PCV) Never done   Zoster Vaccines- Shingrix (1 of 2) Never done   COVID-19 Vaccine (2 - 2024-25 season) 09/28/2022      Objective:     BP (!) 145/64   Pulse 85   Ht 5' 5.75" (1.67 m)   Wt 175 lb 0.6 oz (79.4 kg)   SpO2 99%   BMI 28.47 kg/m    Physical Exam General: Alert, oriented to person place location and situation HEENT: PERRLA, EOMI, moist mucosa CV: Regular rhythm Pulmonary: No respiratory distress Neuro: Cranial nerves II through XII grossly intact, sensation intact, patellar tendon and bicep tendon reflex equal bilaterally.  Sensation intact bilaterally.  Normal gait.   No results found for any visits on 06/02/23.      Assessment & Plan:   Cognitive impairment Assessment & Plan: Patient scored a 9/30 out of the slums test.  His results have been scanned and put in the media tab.  lab testing so far has been normal.  CT scan showed increased lateral ventricle space but this is likely due to atrophy and not normal pressure hydrocephalus.  Has a normal neurologic exam otherwise, no balance or gait abnormalities.  No history of incontinence.  Patient has a appointment with the neurologist in June.      Return in about 4 months (around 10/03/2023) for memory f/u.    Laneta Pintos, MD

## 2023-06-23 ENCOUNTER — Other Ambulatory Visit: Payer: Self-pay | Admitting: Nurse Practitioner

## 2023-06-23 DIAGNOSIS — I10 Essential (primary) hypertension: Secondary | ICD-10-CM

## 2023-06-24 MED ORDER — LOSARTAN POTASSIUM 100 MG PO TABS
100.0000 mg | ORAL_TABLET | Freq: Every day | ORAL | 3 refills | Status: DC
Start: 2023-06-24 — End: 2023-10-06

## 2023-07-18 ENCOUNTER — Other Ambulatory Visit: Payer: Self-pay | Admitting: Nurse Practitioner

## 2023-07-18 ENCOUNTER — Other Ambulatory Visit: Payer: Self-pay | Admitting: Family Medicine

## 2023-07-18 DIAGNOSIS — I10 Essential (primary) hypertension: Secondary | ICD-10-CM

## 2023-07-18 DIAGNOSIS — E785 Hyperlipidemia, unspecified: Secondary | ICD-10-CM

## 2023-07-18 DIAGNOSIS — F411 Generalized anxiety disorder: Secondary | ICD-10-CM

## 2023-07-21 ENCOUNTER — Other Ambulatory Visit: Payer: Self-pay

## 2023-07-21 ENCOUNTER — Encounter: Payer: Self-pay | Admitting: Neurology

## 2023-07-21 ENCOUNTER — Ambulatory Visit (INDEPENDENT_AMBULATORY_CARE_PROVIDER_SITE_OTHER): Admitting: Neurology

## 2023-07-21 VITALS — BP 134/73 | HR 68 | Resp 15 | Ht 65.0 in | Wt 177.5 lb

## 2023-07-21 DIAGNOSIS — G3184 Mild cognitive impairment, so stated: Secondary | ICD-10-CM | POA: Diagnosis not present

## 2023-07-21 DIAGNOSIS — I10 Essential (primary) hypertension: Secondary | ICD-10-CM

## 2023-07-21 DIAGNOSIS — E782 Mixed hyperlipidemia: Secondary | ICD-10-CM

## 2023-07-21 MED ORDER — HYDROCHLOROTHIAZIDE 12.5 MG PO CAPS: 12.5000 mg | ORAL_CAPSULE | Freq: Every day | ORAL | 3 refills | Status: DC

## 2023-07-21 MED ORDER — ROSUVASTATIN CALCIUM 40 MG PO TABS: 40.0000 mg | ORAL_TABLET | Freq: Every day | ORAL | 1 refills | Status: DC

## 2023-07-21 MED ORDER — AMLODIPINE BESYLATE 10 MG PO TABS: 10.0000 mg | ORAL_TABLET | Freq: Every day | ORAL | 3 refills | Status: DC

## 2023-07-21 MED ORDER — DONEPEZIL HCL 5 MG PO TABS: 5.0000 mg | ORAL_TABLET | Freq: Every day | ORAL | 6 refills | Status: DC

## 2023-07-21 NOTE — Patient Instructions (Addendum)
 Most recent TSH and B12 are within normal limits, will obtain ATN profile in April E4 genotype Will start Aricept 5 mg nightly, side effect include vivid dream, dizziness and diarrhea Continue with your other medications Continue to follow with PCP Return in 1 year or sooner if worse.   There are well-accepted and sensible ways to reduce risk for Alzheimers disease and other degenerative brain disorders .  Exercise Daily Walk A daily 20 minute walk should be part of your routine. Disease related apathy can be a significant roadblock to exercise and the only way to overcome this is to make it a daily routine and perhaps have a reward at the end (something your loved one loves to eat or drink perhaps) or a personal trainer coming to the home can also be very useful. Most importantly, the patient is much more likely to exercise if the caregiver / spouse does it with him/her. In general a structured, repetitive schedule is best.  General Health: Any diseases which effect your body will effect your brain such as a pneumonia, urinary infection, blood clot, heart attack or stroke. Keep contact with your primary care doctor for regular follow ups.  Sleep. A good nights sleep is healthy for the brain. Seven hours is recommended. If you have insomnia or poor sleep habits we can give you some instructions. If you have sleep apnea wear your mask.  Diet: Eating a heart healthy diet is also a good idea; fish and poultry instead of red meat, nuts (mostly non-peanuts), vegetables, fruits, olive oil or canola oil (instead of butter), minimal salt (use other spices to flavor foods), whole grain rice, bread, cereal and pasta and wine in moderation.Research is now showing that the MIND diet, which is a combination of The Mediterranean diet and the DASH diet, is beneficial for cognitive processing and longevity. Information about this diet can be found in The MIND Diet, a book by Annitta Feeling, MS, RDN, and online at  WildWildScience.es  Finances, Power of [address] and Advance Directives: You should consider putting legal safeguards in place with regard to financial and medical decision making. While the spouse always has power of attorney for medical and financial issues in the absence of any form, you should consider what you want in case the spouse / caregiver is no longer around or capable of making decisions.

## 2023-07-21 NOTE — Progress Notes (Signed)
 [facility]  PATIENT: BECK COFER DOB: 18-Dec-1955  REQUESTING CLINICIAN: Chandra Toribio POUR, MD HISTORY FROM: Patient/Wife  REASON FOR VISIT: Memory loss    HISTORICAL  CHIEF COMPLAINT:  Chief Complaint  Patient presents with   New Patient (Initial Visit)    Rm13, wife present, NP Internal referral for memory loss: mmse score of 20    HISTORY OF PRESENT ILLNESS:  This is 68 year old woman gentleman past medical history hypertension, hyperlipidemia, depression who is presenting with wife with complaints of memory loss.  Patient feels like his memory is fine but tells me that wife has been complaining about his memory.  Wife tells me that patient memory is getting poor, he is very forgetful, repetitive.  He may be stating that he has an Doctor appointment but the appointment is in 104-months, and wife has to remind him that his appointment is in 6 months.  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  It is difficult for him to find things, has to rely on wife to find objects that he misplaced.   TBI:   No past history of TBI Stroke:   no past history of stroke Seizures:   no past history of seizures Sleep:  no history of sleep apnea.  Mood: Yes, but stop the Celexa   Family history of Dementia: Mother with dementia, late 25s  Functional status: independent in all ADLs and IADLs Patient lives with spouse. Cooking: yes, but sometimes difficult to turn on the stove Cleaning: no issues  Shopping: no Bathing: no issues  Toileting: no issues  Driving: not anymore  Bills: spouse Medications: wife helps with the meds  Ever left the stove on by accident?: denies Forget how to use items around the house?: sometimes having difficulty turning on the stove  Getting lost going to familiar places?: denies  Forgetting loved ones names?: denies   Word finding difficulty? Denies Sleep: Good    OTHER MEDICAL CONDITIONS: Hypertension, Hyperlipidemia, Depression    REVIEW OF SYSTEMS: Full 14 system review of systems performed and negative with exception of: As noted in the HPI   ALLERGIES: No Known Allergies  HOME MEDICATIONS: Outpatient Medications Prior to Visit  Medication Sig Dispense Refill   amLODipine  (NORVASC ) 10 MG tablet Take 1 tablet (10 mg total) by mouth daily. 90 tablet 3   Ascorbic Acid (VITAMIN C ADULT GUMMIES PO) Take 35 mg by mouth daily.     Cats Claw 350 MG CAPS Take 1 capsule by mouth daily.     Choline Dihydrogen Citrate (CHOLINE CITRATE PO) Take 800 mg by mouth daily.     CITRUS BERGAMOT PO Take 1,000 mg by mouth daily.     CLEAR EYES REDNESS RELIEF 0.012-0.25 % SOLN SMARTSIG:1 Drop(s) In Eye(s) PRN     Coenzyme Q10 (COQ-10) 30 MG CAPS Take 20 mLs by mouth.     hydrochlorothiazide  (MICROZIDE ) 12.5 MG capsule Take 1 capsule (12.5 mg total) by mouth daily. 90 capsule 3   losartan  (COZAAR ) 100 MG tablet Take 1 tablet (100 mg total) by mouth daily. 90 tablet 3   Magnesium Oxide 420 MG TABS Take 1 tablet by mouth daily.     METHYLENE BLUE PO Take 12 mg by mouth daily.     metoprolol  succinate (TOPROL -XL) 50 MG 24 hr tablet TAKE 1 TABLET BY MOUTH DAILY  90 tablet 3   Omega-3 Fatty Acids (FISH OIL) 1200 MG CAPS Take 1 capsule by mouth daily.     Pyridoxine HCl (B-6 PO) Take 10 mg by mouth daily.     sildenafil  (REVATIO ) 20 MG tablet Take 1-3 tablets (20-60 mg total) by mouth daily as needed. 20 tablet 3   citalopram  (CELEXA ) 20 MG tablet TAKE 1 TABLET BY MOUTH DAILY 90 tablet 3   rosuvastatin  (CRESTOR ) 40 MG tablet Take 1 tablet (40 mg total) by mouth daily. 90 tablet 3   No facility-administered medications prior to visit.    PAST MEDICAL HISTORY: Past Medical History:  Diagnosis Date   Cataract    removed both eyes   GERD (gastroesophageal reflux disease)    hx of LPR   Hyperlipidemia    on meds    Hypertension    on meds   Personal history of colonic polyps - adenomas 11/05/2006   Prediabetes    no meds    PAST SURGICAL HISTORY: Past Surgical History:  Procedure Laterality Date   COLONOSCOPY     EXTERNAL EAR SURGERY     as child   pain block     Herniated disc repair   POLYPECTOMY     TA+   WRIST SURGERY     tendon    FAMILY HISTORY: Family History  Problem Relation Age of Onset   COPD Mother    Crohn's disease Brother    Hyperlipidemia Brother    Colon cancer Neg Hx    Colon polyps Neg Hx    Esophageal cancer Neg Hx    Rectal cancer Neg Hx    Stomach cancer Neg Hx     SOCIAL HISTORY: Social History   Socioeconomic History   Marital status: Married    Spouse name: [place]   Number of children: 2   Years of education: 12th grade   Highest education level: Not on file  Occupational History   Occupation: Civil Service fast streamer for [employer]  Tobacco Use   Smoking status: Former    Current packs/day: 0.00    Average packs/day: 0.4 packs/day for 5.0 years (2.0 ttl pk-yrs)    Types: Cigarettes    Start date: 01/28/1976    Quit date: 01/27/1981    Years since quitting: 42.5   Smokeless tobacco: Former    Types: Chew    Quit date: 01/27/1981  Vaping Use   Vaping status: Never Used  Substance and Sexual Activity   Alcohol use: Not Currently    Alcohol/week: 1.0 standard drink of alcohol    Types: 1 Standard drinks or equivalent per week   Drug use: No   Sexual activity: Yes  Other Topics Concern   Not on file  Social History Narrative   Lives with his wife and son, Sidra. His daughter lives independently in [place], [place].   Education: [employer]   Exercise: Yes   Social Drivers of Health   Financial Resource Strain: Low Risk  (12/02/2022)   Overall Financial Resource Strain (CARDIA)    Difficulty of Paying Living Expenses: Not hard at all  Food Insecurity: No Food Insecurity (12/02/2022)   Hunger Vital Sign    Worried About Running Out of Food in the  Last Year: Never true    Ran Out of Food in the Last Year: Never true  Transportation Needs: No Transportation Needs (12/02/2022)   PRAPARE - Administrator, Civil Service (Medical): No    Lack of Transportation (Non-Medical): No  Physical Activity: Sufficiently Active (12/02/2022)   Exercise Vital Sign    Days of Exercise per Week: 2 days    Minutes of Exercise per Session: 90 min  Stress: No Stress Concern Present (12/02/2022)   [employer] of Occupational Health - Occupational Stress Questionnaire    Feeling of Stress : Not at all  Social Connections: Moderately Isolated (12/02/2022)   Social Connection and Isolation Panel    Frequency of Communication with Friends and Family: Three times a week    Frequency of Social Gatherings with Friends and Family: More than three times a week    Attends Religious Services: Never    Database administrator or Organizations: No    Attends Banker Meetings: Never    Marital Status: Married  Catering manager Violence: Not At Risk (12/02/2022)   Humiliation, Afraid, Rape, and Kick questionnaire    Fear of Current or Ex-Partner: No    Emotionally Abused: No    Physically Abused: No    Sexually Abused: No    PHYSICAL EXAM  GENERAL EXAM/CONSTITUTIONAL: Vitals:  Vitals:   07/21/23 1036  BP: 134/73  Pulse: 68  Resp: 15  SpO2: 96%  Weight: 177 lb 8 oz (80.5 kg)  Height: 5' 5 (1.651 m)   Body mass index is 29.54 kg/m. Wt Readings from Last 3 Encounters:  07/21/23 177 lb 8 oz (80.5 kg)  06/02/23 175 lb 0.6 oz (79.4 kg)  04/29/23 176 lb 6.4 oz (80 kg)   Patient is in no distress; well developed, nourished and groomed; neck is supple  MUSCULOSKELETAL: Gait, strength, tone, movements noted in Neurologic exam below  NEUROLOGIC: MENTAL STATUS:     07/21/2023   10:38 AM  MMSE - Mini Mental State Exam  Orientation to time 4  Orientation to Place 5  Registration 3  Attention/ Calculation 0  Recall 1   Language- name 2 objects 2  Language- repeat 0  Language- follow 3 step command 3  Language- read & follow direction 1  Write a sentence 1  Copy design 0  Total score 20    CRANIAL NERVE:  2nd, 3rd, 4th, 6th- visual fields full to confrontation, extraocular muscles intact, no nystagmus 5th - facial sensation symmetric 7th - facial strength symmetric 8th - hearing intact 9th - palate elevates symmetrically, uvula midline 11th - shoulder shrug symmetric 12th - tongue protrusion midline He does have a constricted vision field  MOTOR:  normal bulk and tone, full strength in the BUE, BLE  SENSORY:  normal and symmetric to light touch  COORDINATION:  finger-nose-finger, fine finger movements normal  GAIT/STATION:  normal    DIAGNOSTIC DATA (LABS, IMAGING, TESTING) - I reviewed patient records, labs, notes, testing and imaging myself where available.  Lab Results  Component Value Date   WBC 7.1 04/30/2023   HGB 14.7 04/30/2023   HCT 44.8 04/30/2023   MCV 92 04/30/2023   PLT 259 04/30/2023      Component Value Date/Time   NA 132 (L) 04/30/2023 0933   K 4.0 04/30/2023 0933   CL 92 (L) 04/30/2023 0933   CO2 24 04/30/2023 0933   GLUCOSE 102 (H) 04/30/2023 0933   GLUCOSE 125 (H) 05/13/2017 1215   BUN 11 04/30/2023 0933   CREATININE 0.76 04/30/2023 0933   CREATININE 0.92 05/16/2015 1056   CALCIUM  9.4 04/30/2023 0933   PROT 6.8 04/30/2023 0933   ALBUMIN 4.4 04/30/2023 0933   AST 22 04/30/2023 0933   ALT 22 04/30/2023  0933   ALKPHOS 50 04/30/2023 0933   BILITOT 0.6 04/30/2023 0933   GFRNONAA 90 03/09/2020 1621   GFRNONAA >89 05/16/2015 1056   GFRAA 104 03/09/2020 1621   GFRAA >89 05/16/2015 1056   Lab Results  Component Value Date   CHOL 158 04/30/2023   HDL 55 04/30/2023   LDLCALC 84 04/30/2023   TRIG 104 04/30/2023   CHOLHDL 2.9 04/30/2023   Lab Results  Component Value Date   HGBA1C 6.5 (H) 04/30/2023   Lab Results  Component Value Date    VITAMINB12 431 04/30/2023   Lab Results  Component Value Date   TSH 1.690 04/30/2023    Head CT 05/09/2023 1. No acute intracranial abnormality. 2. Prominence of the lateral ventricles may be related to central predominant atrophy, although a component of normal pressure/communicating hydrocephalus cannot be excluded   ASSESSMENT AND PLAN  68 y.o. year old male with history of hypertension, hyperlipidemia, depression who is presenting with memory concerns described as forgetful, difficulty finding object, difficulty using items around the house.  On exam, he scored 20 out of 30 on MMSE indicative of impairment.  Also he did have trouble naming objects and describing what he sees on an image (Stroke card).  He is still independent in activities of daily living, I have informed patient that his diagnosis is likely mild cognitive impairment versus very mild dementia.  Plan will be to start him on Aricept 5 mg nightly, discussed side effect of medication including diarrhea, vivid dreams and dizziness.  Advised him to contact me if he experiences any side effects.  Continue to follow with PCP, continue with good exercise plan, keeping good health, good diet and good sleep pattern.  I will see him in 1 year for follow-up or sooner if worse.   1. Mild cognitive impairment      Patient Instructions  Most recent TSH and B12 are within normal limits, will obtain ATN profile in April E4 genotype Will start Aricept 5 mg nightly, side effect include vivid dream, dizziness and diarrhea Continue with your other medications Continue to follow with PCP Return in 1 year or sooner if worse.   There are well-accepted and sensible ways to reduce risk for Alzheimers disease and other degenerative brain disorders .  Exercise Daily Walk A daily 20 minute walk should be part of your routine. Disease related apathy can be a significant roadblock to exercise and the only way to overcome this is to make it a daily  routine and perhaps have a reward at the end (something your loved one loves to eat or drink perhaps) or a personal trainer coming to the home can also be very useful. Most importantly, the patient is much more likely to exercise if the caregiver / spouse does it with him/her. In general a structured, repetitive schedule is best.  General Health: Any diseases which effect your body will effect your brain such as a pneumonia, urinary infection, blood clot, heart attack or stroke. Keep contact with your primary care doctor for regular follow ups.  Sleep. A good nights sleep is healthy for the brain. Seven hours is recommended. If you have insomnia or poor sleep habits we can give you some instructions. If you have sleep apnea wear your mask.  Diet: Eating a heart healthy diet is also a good idea; fish and poultry instead of red meat, nuts (mostly non-peanuts), vegetables, fruits, olive oil or canola oil (instead of butter), minimal salt (use other spices to flavor  foods), whole grain rice, bread, cereal and pasta and wine in moderation.Research is now showing that the MIND diet, which is a combination of The Mediterranean diet and the DASH diet, is beneficial for cognitive processing and longevity. Information about this diet can be found in The MIND Diet, a book by Annitta Feeling, MS, RDN, and online at WildWildScience.es  Finances, Power of [address] and Advance Directives: You should consider putting legal safeguards in place with regard to financial and medical decision making. While the spouse always has power of attorney for medical and financial issues in the absence of any form, you should consider what you want in case the spouse / caregiver is no longer around or capable of making decisions.  Orders Placed This Encounter  Procedures   ATN PROFILE   APOE Alzheimer's Risk    Meds ordered this encounter  Medications   donepezil (ARICEPT) 5 MG tablet    Sig: Take 1  tablet (5 mg total) by mouth at bedtime.    Dispense:  30 tablet    Refill:  6    Return in about 1 year (around 07/20/2024).  I personally spent a total of 60 minutes in the care of the patient today including preparing to see the patient, getting/reviewing separately obtained history, performing a medically appropriate exam/evaluation, counseling and educating, placing orders, documenting clinical information in the EHR, independently interpreting results, and communicating results.   Pastor Falling, MD 07/21/2023, 4:20 PM  [facility] [address], [address] [place], [place] [address] [phone]

## 2023-07-31 LAB — APOE ALZHEIMER'S RISK

## 2023-07-31 LAB — ATN PROFILE
A -- Beta-amyloid 42/40 Ratio: 0.095 — ABNORMAL LOW (ref >0.102)
Beta-amyloid 40: 140.35 pg/mL
Beta-amyloid 42: 13.31 pg/mL
N -- NfL, Plasma: 3.54 pg/mL (ref 0.00–3.65)
T -- p-tau181: 1.3 pg/mL — ABNORMAL HIGH (ref 0.00–0.97)

## 2023-08-03 ENCOUNTER — Ambulatory Visit: Payer: Self-pay | Admitting: Neurology

## 2023-08-17 ENCOUNTER — Telehealth: Payer: Self-pay

## 2023-08-17 NOTE — Telephone Encounter (Signed)
 Copied from CRM [id number]. Topic: Clinical - Request for Lab/Test Order >> Aug 17, 2023 10:46 AM Rosaria BRAVO wrote: Reason for CRM: MRI Needed, findings from [place]) from [facility]   Fax confirmed, they are sending fax now. They want the clinic to contact the patient and schedule MRI. Pt's preference.   Best contact: [phone]

## 2023-08-18 NOTE — Telephone Encounter (Signed)
 Randall w/ [facility] called to confirm notes were received. Advised office did receive notes, Randall would like to make sure patient is reached out to for MRI.

## 2023-08-19 ENCOUNTER — Other Ambulatory Visit: Payer: Self-pay | Admitting: Family Medicine

## 2023-08-19 DIAGNOSIS — R4189 Other symptoms and signs involving cognitive functions and awareness: Secondary | ICD-10-CM

## 2023-08-19 NOTE — Telephone Encounter (Signed)
 Called patient he is advised of the MRI that was sent

## 2023-08-19 NOTE — Telephone Encounter (Signed)
 Please let the patient know I have sent in an MRI for him.  Someone should call him to schedule it.

## 2023-08-24 ENCOUNTER — Telehealth: Payer: Self-pay | Admitting: *Deleted

## 2023-08-24 NOTE — Telephone Encounter (Signed)
 Sent a message about scheduling this MRI

## 2023-08-24 NOTE — Telephone Encounter (Signed)
 Copied from CRM [id number]. Topic: Appointments - Scheduling Inquiry for Clinic >> Aug 24, 2023  4:01 PM Antwanette L wrote: Reason for CRM: Sueanne from [facility] is calling in about a fax that was sent on 7/21. The fax was received. The pt needs Dr. Chandra  to schedule him an MRI. Please contact the patient

## 2023-08-25 NOTE — Telephone Encounter (Signed)
 LVM for patient to call office to let them know that they can call and schedule the MRI when they are ready. The number is [phone] per the referral coordinator she stated that they left a message on 7/23 to try and schedule this.

## 2023-09-10 ENCOUNTER — Ambulatory Visit (HOSPITAL_BASED_OUTPATIENT_CLINIC_OR_DEPARTMENT_OTHER)
Admission: RE | Admit: 2023-09-10 | Discharge: 2023-09-10 | Disposition: A | Discharge status: Home / Self Care | Source: Ambulatory Visit | Attending: Family Medicine | Admitting: Family Medicine

## 2023-09-10 DIAGNOSIS — R4189 Other symptoms and signs involving cognitive functions and awareness: Secondary | ICD-10-CM

## 2023-09-10 DIAGNOSIS — R4182 Altered mental status, unspecified: Secondary | ICD-10-CM

## 2023-09-10 DIAGNOSIS — R413 Other amnesia: Secondary | ICD-10-CM

## 2023-09-24 ENCOUNTER — Ambulatory Visit: Payer: Self-pay | Admitting: Family Medicine

## 2023-10-06 ENCOUNTER — Encounter: Payer: Self-pay | Admitting: Family Medicine

## 2023-10-06 ENCOUNTER — Ambulatory Visit (INDEPENDENT_AMBULATORY_CARE_PROVIDER_SITE_OTHER): Admitting: Family Medicine

## 2023-10-06 VITALS — BP 139/81 | HR 56 | Ht 65.0 in | Wt 177.0 lb

## 2023-10-06 DIAGNOSIS — E782 Mixed hyperlipidemia: Secondary | ICD-10-CM

## 2023-10-06 DIAGNOSIS — I10 Essential (primary) hypertension: Secondary | ICD-10-CM | POA: Diagnosis not present

## 2023-10-06 DIAGNOSIS — Z23 Encounter for immunization: Secondary | ICD-10-CM | POA: Diagnosis not present

## 2023-10-06 DIAGNOSIS — R7303 Prediabetes: Secondary | ICD-10-CM

## 2023-10-06 DIAGNOSIS — R4189 Other symptoms and signs involving cognitive functions and awareness: Secondary | ICD-10-CM

## 2023-10-06 MED ORDER — OLMESARTAN-AMLODIPINE-HCTZ 40-10-12.5 MG PO TABS: 1.0000 | ORAL_TABLET | Freq: Every day | ORAL | 1 refills | Status: DC

## 2023-10-06 NOTE — Patient Instructions (Signed)
 It was nice to see you today,  We addressed the following topics today: -I have switched your blood pressure medication so that 3 of your blood pressure medicines will be in 1 pill. - Stop taking your losartan  and amlodipine  and hydrochlorothiazide  once you get this new medication. - The new medication contains olmesartan  amlodipine  and hydrochlorothiazide . - When you get your new medication check your blood pressure twice a day for 2 weeks and then return those values to us .  The goal is to be on average less than 130/85. - Goal I will let you know the results of your other blood tests and if we need to change any other medications after I get those results.   Have a great day,  Rolan Slain, MD

## 2023-10-06 NOTE — Progress Notes (Unsigned)
 Established Patient Office Visit  Subjective   Patient ID: Samuel Macias, male    DOB: 22-Jun-1955  Age: 68 y.o. MRN: [id number]  Chief Complaint  Patient presents with   Medical Management of Chronic Issues    HPI  Memory - aricept .   Subjective - Follow-up for management of multiple chronic conditions including dementia. Reports no new issues since last visit. - Saw neurologist and ophthalmologist for peripheral vision issues. Ophthalmologist recommended an MRI to rule out stroke, which was completed. Results reviewed. 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 Other findings of general atrophy and hippocampal changes are consistent with dementia. - Reports taking Aricept  since June 2025 without side effects. Feels more alert in the morning.  Medications Current medications include Aricept  5mg , hydrochlorothiazide , amlodipine , losartan , and metoprolol . Also takes supplements including methylene blue, Neriva 3D, magnesium, choline, and citrus bergamot.  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 History of small cerebellar infarct. FH: Inherited dementia.  ROS Constitutional: Reports feeling more alert. Denies dizziness. GI: Denies nausea or upset stomach. Visual: Reports peripheral vision issues.  Objective Blood pressure to be rechecked.  Assessment and Plan Dementia (Alzheimer's) - Diagnosis established by neurology with genetic and protein marker confirmation. MRI findings are consistent. - Reports feeling more alert since starting Aricept . - Continue Aricept  5mg  daily. Will discuss dose titration at next visit, after other medication changes are settled. Follow up with neurologist in one year or sooner if new issues  arise.  Hypertension - Currently on hydrochlorothiazide , amlodipine , losartan , and metoprolol . - Office BPs have been in the 130s. Goal is 120s. - Plan to switch from losartan  to a combination pill containing a more effective ARB (candesartan or olmesartan , depending on insurance) with amlodipine  and hydrochlorothiazide  to improve BP control and reduce pill burden. - Patient has a home BP machine. Will be instructed to monitor BP at home for two weeks after medication change to ensure it does not go too low and bring log to follow-up.  Prediabetes/Hyperglycemia - Last A1C in April 2025 was 6.4. - Counseled on the importance of controlling blood sugar, blood pressure, and cholesterol to slow dementia progression. - Plan to check A1C today to assess for progression to diabetes. If A1C is 6.5 or higher, will confirm diabetes diagnosis. - Treatment will depend on today's result, may include diet control or metformin.  Hyperlipidemia - Patient is taking citrus bergamot. Previously on a statin. - Plan to check a lipid panel today. If cholesterol remains elevated, will restart a statin.  Follow-up - Labs ordered today: CMP, A1C, Lipid Panel. - Schedule follow-up in 3 months to review lab results and address BP, blood sugar, and Aricept  dose.    The 10-year ASCVD risk score (Arnett DK, et al., 2019) is: 19.4%  Health Maintenance Due  Topic Date Due   Pneumococcal Vaccine: 50+ Years (1 of 1 - PCV) Never done   Zoster Vaccines- Shingrix (1 of 2) Never done   Influenza Vaccine  08/28/2023   COVID-19 Vaccine (2 - 2025-26 season) 09/28/2023   Medicare Annual Wellness (AWV)  12/02/2023      Objective:     BP (!) 148/69   Pulse (!) 56   Ht 5' 5 (1.651 m)   Wt 177 lb (80.3 kg)   SpO2 98%  BMI 29.45 kg/m  {Vitals History (Optional):23777}  Physical Exam   No results found for any visits on 10/06/23.      Assessment & Plan:   There are no diagnoses linked to this  encounter.   No follow-ups on file.    Toribio MARLA Slain, MD

## 2023-10-07 LAB — HEMOGLOBIN A1C
Est. average glucose Bld gHb Est-mCnc: 128 mg/dL
Hgb A1c MFr Bld: 6.1 % — ABNORMAL HIGH (ref 4.8–5.6)

## 2023-10-07 LAB — LIPID PANEL
Chol/HDL Ratio: 5.3 ratio — ABNORMAL HIGH (ref 0.0–5.0)
Cholesterol, Total: 260 mg/dL — ABNORMAL HIGH (ref 100–199)
HDL: 49 mg/dL (ref >39)
LDL Chol Calc (NIH): 192 mg/dL — ABNORMAL HIGH (ref 0–99)
Triglycerides: 105 mg/dL (ref 0–149)
VLDL Cholesterol Cal: 19 mg/dL (ref 5–40)

## 2023-10-07 LAB — BASIC METABOLIC PANEL WITH GFR
BUN/Creatinine Ratio: 12 (ref 10–24)
BUN: 9 mg/dL (ref 8–27)
CO2: 25 mmol/L (ref 20–29)
Calcium: 9.4 mg/dL (ref 8.6–10.2)
Chloride: 93 mmol/L — ABNORMAL LOW (ref 96–106)
Creatinine, Ser: 0.74 mg/dL — ABNORMAL LOW (ref 0.76–1.27)
Glucose: 115 mg/dL — ABNORMAL HIGH (ref 70–99)
Potassium: 4.2 mmol/L (ref 3.5–5.2)
Sodium: 134 mmol/L (ref 134–144)
eGFR: 99 mL/min/1.73 (ref >59)

## 2023-10-08 ENCOUNTER — Ambulatory Visit: Payer: Self-pay | Admitting: Family Medicine

## 2023-10-08 MED ORDER — ROSUVASTATIN CALCIUM 40 MG PO TABS
40.0000 mg | ORAL_TABLET | Freq: Every day | ORAL | 1 refills | Status: AC
Start: 2023-10-08 — End: ?

## 2023-10-09 NOTE — Assessment & Plan Note (Signed)
-   Patient is taking citrus bergamot. Previously on a statin. - Plan to check a lipid panel today. If cholesterol remains elevated, will restart a statin.

## 2023-10-09 NOTE — Assessment & Plan Note (Signed)
-   Diagnosis established by neurology with genetic and protein marker confirmation. MRI findings are consistent. - Reports feeling more alert since starting Aricept . - Continue Aricept  5mg  daily. Will discuss dose titration at next visit, after other medication changes are settled. Follow up with neurologist in one year or sooner if new issues arise.

## 2023-10-09 NOTE — Assessment & Plan Note (Signed)
-   Currently on hydrochlorothiazide , amlodipine , losartan , and metoprolol . - Office BPs have been in the 130s. Goal is 120s. - Plan to switch from losartan  to a combination pill containing a more effective ARB (olmesartan ) with amlodipine  and hydrochlorothiazide  to improve BP control and reduce pill burden. - Patient has a home BP machine. Will be instructed to monitor BP at home for two weeks after medication change to ensure it does not go too low and bring log to follow-up.

## 2023-10-09 NOTE — Assessment & Plan Note (Signed)
-   Last A1C in April 2025 was 6.4. - Counseled on the importance of controlling blood sugar, blood pressure, and cholesterol to slow dementia progression. - Plan to check A1C today to assess for progression to diabetes. If A1C is 6.5 or higher, will confirm diabetes diagnosis. - Treatment will depend on today's result, may include diet control or metformin.

## 2024-01-12 ENCOUNTER — Ambulatory Visit: Admitting: Family Medicine

## 2024-01-12 ENCOUNTER — Encounter: Payer: Self-pay | Admitting: Family Medicine

## 2024-01-12 VITALS — BP 118/64 | HR 56 | Ht 65.0 in | Wt 183.4 lb

## 2024-01-12 DIAGNOSIS — I10 Essential (primary) hypertension: Secondary | ICD-10-CM | POA: Diagnosis not present

## 2024-01-12 DIAGNOSIS — H53453 Other localized visual field defect, bilateral: Secondary | ICD-10-CM | POA: Diagnosis not present

## 2024-01-12 DIAGNOSIS — E782 Mixed hyperlipidemia: Secondary | ICD-10-CM | POA: Diagnosis not present

## 2024-01-12 DIAGNOSIS — R4189 Other symptoms and signs involving cognitive functions and awareness: Secondary | ICD-10-CM | POA: Diagnosis not present

## 2024-01-12 MED ORDER — OLMESARTAN-AMLODIPINE-HCTZ 40-10-12.5 MG PO TABS: 1.0000 | ORAL_TABLET | Freq: Every day | ORAL | 3 refills | Status: AC

## 2024-01-12 MED ORDER — DONEPEZIL HCL 10 MG PO TABS: 10.0000 mg | ORAL_TABLET | Freq: Every day | ORAL | 2 refills | Status: AC

## 2024-01-12 NOTE — Patient Instructions (Signed)
 It was nice to see you today,  We addressed the following topics today: - Please switch back to the combination blood pressure medication once you receive the refill. - Please continue taking the cholesterol medication as prescribed. - Please start taking Aricept  10 mg daily instead of 5 mg. If you experience any issues, please let us  know and we can switch you back to the 5 mg dose. - Please continue taking Neuriva 3D and Neuro Nova as you have been. - Please schedule a follow-up video visit in approximately six weeks to review your progress. We will consider adding Namenda at that time if you are tolerating the increased Aricept  dose well. - We will check your cholesterol level today and will contact you if any changes to your medication are needed.  Have a great day,  Rolan Slain, MD

## 2024-01-12 NOTE — Progress Notes (Unsigned)
 Established Patient Office Visit  Subjective   Patient ID: Samuel Macias, male    DOB: [birth year]  Age: 68 y.o. MRN: [id number]  Chief Complaint  Patient presents with   Medical Management of Chronic Issues    HPI Subjective - Reports no new issues or concerns. - Medication adherence: Has been taking the combination blood pressure medication but ran out for the last 3-4 days. Switched back to the separate pills for the old medication. A refill for the combination medication is expected today or tomorrow. Has been taking the cholesterol medication again for approximately two months. Has been taking Aricept  5 mg daily, which was prescribed by a neurologist. Reports no side effects. Has been taking Neuriva 3D and Neuro Nova (methylene blue). Stopped taking a previous supplement (Focus Factor) and vitamin B6.   Medications - Combination blood pressure medication (recently restarted separate components due to running out). - Cholesterol medication 40 mg (restarted approximately two months ago). - Aricept  5 mg daily. - Neuriva 3D. - Neuro Nova (methylene blue).   ROS: Denies any new issues or concerns. No side effects from Aricept  reported.   The ASCVD Risk score (Arnett DK, et al., 2019) failed to calculate for the following reasons:   According to ACC/AHA guidelines, patients with an LDL-C level greater than 190 mg/dL (5.08 mmol/L) should be considered for high-intensity statin therapy. This patient's most recent LDL-C level is 192 mg/dL.  Health Maintenance Due  Topic Date Due   Pneumococcal Vaccine: 50+ Years (1 of 1 - PCV) Never done   Zoster Vaccines- Shingrix (1 of 2) Never done   COVID-19 Vaccine (2 - 2025-26 season) 09/28/2023   Medicare Annual Wellness (AWV)  12/02/2023      Objective:     BP 118/64   Pulse (!) 56   Ht 5' 5 (1.651 m)   Wt 183 lb 6.4 oz (83.2 kg)   SpO2 99%   BMI 30.52 kg/m    Physical Exam Gen: alert, oriented. Accompanied by wife Pulm:  no respiratory distress Psych: pleasant affect      Assessment & Plan:   Mixed hyperlipidemia Assessment & Plan: Restarted rosuvastatin  40mg  approximately two months ago. Will recheck cholesterol levels today. If levels remain elevated, will consider increasing the dose.  Orders: -     Lipid panel  Essential hypertension Assessment & Plan: Hypertension: Blood pressure is well-controlled on current medication. Reports running out of the combination pill and temporarily switching back to separate components. Will send a new prescription for the omesartan-amlodipine -hydrochlorothiazide  combo pill w/ refills for 1 year   Decreased peripheral vision of both eyes Assessment & Plan: Follow-up with ophthalmology is scheduled for a visual field test, which was previously canceled due to equipment issues. The purpose is to investigate the cause of the peripheral vision defect, which is not thought to be related to a stroke.   Cognitive impairment Assessment & Plan: Currently taking Aricept  5 mg daily, prescribed by a neurologist. Tolerating well with no reported side effects. Will increase Aricept  to 10 mg daily. Discussed adding Namenda in the future if needed. Plan for a follow-up video visit in six weeks to assess tolerance and consider adding Namenda.   Other orders -     Olmesartan -amLODIPine -HCTZ; Take 1 tablet by mouth daily.  Dispense: 90 tablet; Refill: 3 -     Donepezil  HCl; Take 1 tablet (10 mg total) by mouth at bedtime.  Dispense: 30 tablet; Refill: 2     Return in about  6 weeks (around 02/23/2024) for memory.    Toribio MARLA Slain, MD

## 2024-01-13 ENCOUNTER — Ambulatory Visit: Payer: Self-pay | Admitting: Family Medicine

## 2024-01-13 DIAGNOSIS — H53459 Other localized visual field defect, unspecified eye: Secondary | ICD-10-CM | POA: Insufficient documentation

## 2024-01-13 LAB — LIPID PANEL
Chol/HDL Ratio: 3.4 ratio (ref 0.0–5.0)
Cholesterol, Total: 161 mg/dL (ref 100–199)
HDL: 48 mg/dL (ref >39)
LDL Chol Calc (NIH): 94 mg/dL (ref 0–99)
Triglycerides: 106 mg/dL (ref 0–149)
VLDL Cholesterol Cal: 19 mg/dL (ref 5–40)

## 2024-01-13 NOTE — Assessment & Plan Note (Signed)
 Hypertension: Blood pressure is well-controlled on current medication. Reports running out of the combination pill and temporarily switching back to separate components. Will send a new prescription for the omesartan-amlodipine -hydrochlorothiazide  combo pill w/ refills for 1 year

## 2024-01-13 NOTE — Assessment & Plan Note (Signed)
 Currently taking Aricept  5 mg daily, prescribed by a neurologist. Tolerating well with no reported side effects. Will increase Aricept  to 10 mg daily. Discussed adding Namenda in the future if needed. Plan for a follow-up video visit in six weeks to assess tolerance and consider adding Namenda.

## 2024-01-13 NOTE — Assessment & Plan Note (Signed)
 Restarted rosuvastatin  40mg  approximately two months ago. Will recheck cholesterol levels today. If levels remain elevated, will consider increasing the dose.

## 2024-01-13 NOTE — Assessment & Plan Note (Signed)
 Follow-up with ophthalmology is scheduled for a visual field test, which was previously canceled due to equipment issues. The purpose is to investigate the cause of the peripheral vision defect, which is not thought to be related to a stroke.

## 2024-02-24 ENCOUNTER — Encounter: Payer: Self-pay | Admitting: Family Medicine

## 2024-02-24 ENCOUNTER — Telehealth: Admitting: Family Medicine

## 2024-02-24 ENCOUNTER — Telehealth: Payer: Self-pay

## 2024-02-24 VITALS — Ht 65.0 in | Wt 183.0 lb

## 2024-02-24 DIAGNOSIS — R4189 Other symptoms and signs involving cognitive functions and awareness: Secondary | ICD-10-CM

## 2024-02-24 MED ORDER — MEMANTINE HCL ER 7 MG PO CP24: 7.0000 mg | ORAL_CAPSULE | Freq: Every day | ORAL | 2 refills | Status: AC

## 2024-02-24 NOTE — Assessment & Plan Note (Signed)
 Tolerating donepazil.  Would need to wait a few more months to increase it.  - sending in namenda  at lowest dose.   - re-evaluate in 8 weeks w/ video visit

## 2024-02-24 NOTE — Telephone Encounter (Addendum)
 LVM for returned call for appt   Return in about 2 months (around 04/23/2024) for memory - video visit. and to see which pharmacy provider needed to send medication whether mail in or local.

## 2024-02-24 NOTE — Progress Notes (Addendum)
" ° °  Established Patient Office Visit  Subjective   Patient ID: Samuel Macias, male    DOB: [birth year]  Age: 69 y.o. MRN: [id number]  Chief Complaint  Patient presents with   Medical Management of Chronic Issues   Pt location: home  Provider location: [facility]: video Duration: 15 min  HPI  - doing well with donepazil 10mg .  No SE.  Maybe notices some slight improvement - okay with starting namenda .  Discussed side effects (dizziness, confusion, n/v/d, etc) - pt okay with starting namenda  and re-evaluating in 2 more months   The ASCVD Risk score (Arnett DK, et al., 2019) failed to calculate for the following reasons:   The systolic blood pressure is missing  Health Maintenance Due  Topic Date Due   COVID-19 Vaccine (2 - 2025-26 season) 09/28/2023   Medicare Annual Wellness (AWV)  12/02/2023      Objective:     Ht 5' 5 (1.651 m)   Wt 183 lb (83 kg)   BMI 30.45 kg/m    Physical Exam Gen: alert, oriented Pulm: no respiratory distress Psych: pleasant affect   No results found for any visits on 02/24/24.      Assessment & Plan:   Cognitive impairment Assessment & Plan: Tolerating donepazil.  Would need to wait a few more months to increase it.  - sending in namenda  at lowest dose.   - re-evaluate in 8 weeks w/ video visit      Return in about 2 months (around 04/23/2024) for memory - video visit.    Toribio MARLA Slain, MD "

## 2024-07-26 ENCOUNTER — Ambulatory Visit: Admitting: Neurology
